# Patient Record
Sex: Female | Born: 1960 | Race: Black or African American | Hispanic: No | Smoking: Former smoker
Health system: Southern US, Community
[De-identification: ages and names within clinical notes are randomized; demographics above are authoritative.]

## PROBLEM LIST (undated history)

## (undated) DIAGNOSIS — F418 Other specified anxiety disorders: Secondary | ICD-10-CM

## (undated) DIAGNOSIS — I10 Essential (primary) hypertension: Secondary | ICD-10-CM

## (undated) DIAGNOSIS — E46 Unspecified protein-calorie malnutrition: Secondary | ICD-10-CM

## (undated) DIAGNOSIS — K219 Gastro-esophageal reflux disease without esophagitis: Secondary | ICD-10-CM

## (undated) DIAGNOSIS — N12 Tubulo-interstitial nephritis, not specified as acute or chronic: Secondary | ICD-10-CM

## (undated) DIAGNOSIS — F419 Anxiety disorder, unspecified: Secondary | ICD-10-CM

## (undated) DIAGNOSIS — I639 Cerebral infarction, unspecified: Secondary | ICD-10-CM

## (undated) DIAGNOSIS — F329 Major depressive disorder, single episode, unspecified: Secondary | ICD-10-CM

## (undated) DIAGNOSIS — I82409 Acute embolism and thrombosis of unspecified deep veins of unspecified lower extremity: Secondary | ICD-10-CM

## (undated) DIAGNOSIS — E7800 Pure hypercholesterolemia, unspecified: Secondary | ICD-10-CM

## (undated) DIAGNOSIS — G8929 Other chronic pain: Secondary | ICD-10-CM

## (undated) DIAGNOSIS — F32A Depression, unspecified: Secondary | ICD-10-CM

## (undated) HISTORY — PX: DILATION AND CURETTAGE OF UTERUS: SHX78

## (undated) HISTORY — PX: TRACHEOSTOMY: SUR1362

---
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
## 1992-01-19 HISTORY — PX: TUBAL LIGATION: SHX77

## 2007-04-19 DIAGNOSIS — I639 Cerebral infarction, unspecified: Secondary | ICD-10-CM

## 2007-04-19 HISTORY — DX: Cerebral infarction, unspecified: I63.9

## 2013-06-08 ENCOUNTER — Inpatient Hospital Stay (HOSPITAL_COMMUNITY)
Admission: EM | Admit: 2013-06-08 | Discharge: 2013-06-10 | DRG: 682 | Disposition: A | Discharge status: Home Health | Payer: Medicare Other | Attending: Internal Medicine | Admitting: Internal Medicine

## 2013-06-08 ENCOUNTER — Encounter (HOSPITAL_COMMUNITY): Payer: Self-pay | Admitting: Emergency Medicine

## 2013-06-08 ENCOUNTER — Emergency Department (HOSPITAL_COMMUNITY): Payer: Medicare Other

## 2013-06-08 ENCOUNTER — Inpatient Hospital Stay (HOSPITAL_COMMUNITY): Payer: Medicare Other

## 2013-06-08 DIAGNOSIS — N179 Acute kidney failure, unspecified: Principal | ICD-10-CM

## 2013-06-08 DIAGNOSIS — K5289 Other specified noninfective gastroenteritis and colitis: Secondary | ICD-10-CM | POA: Diagnosis present

## 2013-06-08 DIAGNOSIS — E785 Hyperlipidemia, unspecified: Secondary | ICD-10-CM | POA: Diagnosis present

## 2013-06-08 DIAGNOSIS — D72829 Elevated white blood cell count, unspecified: Secondary | ICD-10-CM | POA: Diagnosis present

## 2013-06-08 DIAGNOSIS — F329 Major depressive disorder, single episode, unspecified: Secondary | ICD-10-CM | POA: Diagnosis present

## 2013-06-08 DIAGNOSIS — I10 Essential (primary) hypertension: Secondary | ICD-10-CM

## 2013-06-08 DIAGNOSIS — I69959 Hemiplegia and hemiparesis following unspecified cerebrovascular disease affecting unspecified side: Secondary | ICD-10-CM

## 2013-06-08 DIAGNOSIS — Z66 Do not resuscitate: Secondary | ICD-10-CM | POA: Diagnosis present

## 2013-06-08 DIAGNOSIS — I69359 Hemiplegia and hemiparesis following cerebral infarction affecting unspecified side: Secondary | ICD-10-CM

## 2013-06-08 DIAGNOSIS — R4781 Slurred speech: Secondary | ICD-10-CM | POA: Diagnosis present

## 2013-06-08 DIAGNOSIS — G9341 Metabolic encephalopathy: Secondary | ICD-10-CM | POA: Diagnosis present

## 2013-06-08 DIAGNOSIS — R29810 Facial weakness: Secondary | ICD-10-CM | POA: Diagnosis present

## 2013-06-08 DIAGNOSIS — E860 Dehydration: Secondary | ICD-10-CM | POA: Diagnosis present

## 2013-06-08 DIAGNOSIS — G40909 Epilepsy, unspecified, not intractable, without status epilepticus: Secondary | ICD-10-CM | POA: Diagnosis present

## 2013-06-08 DIAGNOSIS — R4701 Aphasia: Secondary | ICD-10-CM | POA: Diagnosis present

## 2013-06-08 DIAGNOSIS — Z79899 Other long term (current) drug therapy: Secondary | ICD-10-CM

## 2013-06-08 DIAGNOSIS — F3289 Other specified depressive episodes: Secondary | ICD-10-CM | POA: Diagnosis present

## 2013-06-08 HISTORY — DX: Essential (primary) hypertension: I10

## 2013-06-08 HISTORY — DX: Cerebral infarction, unspecified: I63.9

## 2013-06-08 LAB — COMPREHENSIVE METABOLIC PANEL
ALT: 22 U/L (ref 0–35)
AST: 26 U/L (ref 0–37)
Albumin: 3.6 g/dL (ref 3.5–5.2)
Alkaline Phosphatase: 106 U/L (ref 39–117)
BUN: 37 mg/dL — ABNORMAL HIGH (ref 6–23)
CHLORIDE: 97 meq/L (ref 96–112)
CO2: 27 meq/L (ref 19–32)
CREATININE: 2.38 mg/dL — AB (ref 0.50–1.10)
Calcium: 9.6 mg/dL (ref 8.4–10.5)
GFR calc Af Amer: 26 mL/min — ABNORMAL LOW (ref >90)
GFR, EST NON AFRICAN AMERICAN: 23 mL/min — AB (ref >90)
GLUCOSE: 113 mg/dL — AB (ref 70–99)
Potassium: 3.8 mEq/L (ref 3.7–5.3)
Sodium: 140 mEq/L (ref 137–147)
Total Protein: 8.4 g/dL — ABNORMAL HIGH (ref 6.0–8.3)

## 2013-06-08 LAB — CBC
HEMATOCRIT: 41.6 % (ref 36.0–46.0)
HEMATOCRIT: 41.7 % (ref 36.0–46.0)
HEMOGLOBIN: 13.6 g/dL (ref 12.0–15.0)
HEMOGLOBIN: 13.7 g/dL (ref 12.0–15.0)
MCH: 30.7 pg (ref 26.0–34.0)
MCH: 31.6 pg (ref 26.0–34.0)
MCHC: 32.7 g/dL (ref 30.0–36.0)
MCHC: 32.9 g/dL (ref 30.0–36.0)
MCV: 93.9 fL (ref 78.0–100.0)
MCV: 96.3 fL (ref 78.0–100.0)
Platelets: 219 10*3/uL (ref 150–400)
Platelets: 177 10*3/uL (ref 150–400)
RBC: 4.43 MIL/uL (ref 3.87–5.11)
RBC: 4.33 MIL/uL (ref 3.87–5.11)
RDW: 13.3 % (ref 11.5–15.5)
RDW: 13.3 % (ref 11.5–15.5)
WBC: 6.9 10*3/uL (ref 4.0–10.5)
WBC: 7 10*3/uL (ref 4.0–10.5)

## 2013-06-08 LAB — I-STAT TROPONIN, ED: Troponin i, poc: 0.04 ng/mL (ref 0.00–0.08)

## 2013-06-08 LAB — GLUCOSE, CAPILLARY: Glucose-Capillary: 77 mg/dL (ref 70–99)

## 2013-06-08 LAB — SODIUM, URINE, RANDOM: Sodium, Ur: 84 mEq/L

## 2013-06-08 LAB — DIFFERENTIAL
BASOS ABS: 0 10*3/uL (ref 0.0–0.1)
Basophils Relative: 0 % (ref 0–1)
Eosinophils Absolute: 0.1 10*3/uL (ref 0.0–0.7)
Eosinophils Relative: 1 % (ref 0–5)
LYMPHS ABS: 2.1 10*3/uL (ref 0.7–4.0)
LYMPHS PCT: 31 % (ref 12–46)
MONO ABS: 0.7 10*3/uL (ref 0.1–1.0)
MONOS PCT: 9 % (ref 3–12)
Neutro Abs: 4 10*3/uL (ref 1.7–7.7)
Neutrophils Relative %: 59 % (ref 43–77)

## 2013-06-08 LAB — URINALYSIS, ROUTINE W REFLEX MICROSCOPIC
BILIRUBIN URINE: NEGATIVE
Glucose, UA: NEGATIVE mg/dL
Ketones, ur: NEGATIVE mg/dL
Leukocytes, UA: NEGATIVE
Nitrite: NEGATIVE
PH: 5.5 (ref 5.0–8.0)
Protein, ur: NEGATIVE mg/dL
SPECIFIC GRAVITY, URINE: 1.025 (ref 1.005–1.030)
Urobilinogen, UA: 0.2 mg/dL (ref 0.0–1.0)

## 2013-06-08 LAB — CREATININE, URINE, RANDOM: Creatinine, Urine: 217.05 mg/dL

## 2013-06-08 LAB — RAPID URINE DRUG SCREEN, HOSP PERFORMED
Amphetamines: NOT DETECTED
BENZODIAZEPINES: NOT DETECTED
Barbiturates: NOT DETECTED
COCAINE: NOT DETECTED
Opiates: NOT DETECTED
Tetrahydrocannabinol: NOT DETECTED

## 2013-06-08 LAB — I-STAT CHEM 8, ED
BUN: 36 mg/dL — ABNORMAL HIGH (ref 6–23)
CALCIUM ION: 1.14 mmol/L (ref 1.12–1.23)
CREATININE: 2.5 mg/dL — AB (ref 0.50–1.10)
Chloride: 101 mEq/L (ref 96–112)
Glucose, Bld: 116 mg/dL — ABNORMAL HIGH (ref 70–99)
HCT: 45 % (ref 36.0–46.0)
HEMOGLOBIN: 15.3 g/dL — AB (ref 12.0–15.0)
Potassium: 3.6 mEq/L — ABNORMAL LOW (ref 3.7–5.3)
SODIUM: 140 meq/L (ref 137–147)
TCO2: 29 mmol/L (ref 0–100)

## 2013-06-08 LAB — APTT: aPTT: 28 seconds (ref 24–37)

## 2013-06-08 LAB — PROTIME-INR
INR: 1.03 (ref 0.00–1.49)
Prothrombin Time: 13.3 seconds (ref 11.6–15.2)

## 2013-06-08 LAB — URINE MICROSCOPIC-ADD ON

## 2013-06-08 LAB — ETHANOL

## 2013-06-08 LAB — PHENYTOIN LEVEL, TOTAL: Phenytoin Lvl: 14.6 ug/mL (ref 10.0–20.0)

## 2013-06-08 MED ORDER — SODIUM CHLORIDE 0.9 % IV SOLN
INTRAVENOUS | Status: DC
  Administered 2013-06-08 – 2013-06-09 (×2): via INTRAVENOUS

## 2013-06-08 MED ORDER — ACETAMINOPHEN 650 MG RE SUPP: 650.0000 mg | RECTAL | Status: DC | PRN

## 2013-06-08 MED ORDER — ENOXAPARIN SODIUM 40 MG/0.4ML ~~LOC~~ SOLN
40.0000 mg | SUBCUTANEOUS | Status: DC
  Administered 2013-06-08 – 2013-06-09 (×2): 40 mg via SUBCUTANEOUS
  Filled 2013-06-08 (×3): qty 0.4

## 2013-06-08 MED ORDER — ASPIRIN 300 MG RE SUPP
300.0000 mg | Freq: Every day | RECTAL | Status: DC
  Filled 2013-06-08 (×2): qty 1

## 2013-06-08 MED ORDER — PHENYTOIN SODIUM EXTENDED 100 MG PO CAPS
100.0000 mg | ORAL_CAPSULE | Freq: Every day | ORAL | Status: DC
  Filled 2013-06-08: qty 1

## 2013-06-08 MED ORDER — SENNOSIDES-DOCUSATE SODIUM 8.6-50 MG PO TABS: 1.0000 | ORAL_TABLET | Freq: Every evening | ORAL | Status: DC | PRN

## 2013-06-08 MED ORDER — ACETAMINOPHEN 325 MG PO TABS: 650.0000 mg | ORAL_TABLET | ORAL | Status: DC | PRN

## 2013-06-08 MED ORDER — ASPIRIN 325 MG PO TABS
325.0000 mg | ORAL_TABLET | Freq: Every day | ORAL | Status: DC
  Administered 2013-06-09 – 2013-06-10 (×2): 325 mg via ORAL
  Filled 2013-06-08 (×2): qty 1

## 2013-06-08 MED ORDER — SIMVASTATIN 40 MG PO TABS
40.0000 mg | ORAL_TABLET | Freq: Every day | ORAL | Status: DC
  Administered 2013-06-09: 40 mg via ORAL
  Filled 2013-06-08 (×3): qty 1

## 2013-06-08 NOTE — ED Notes (Signed)
Patient transported to CT 

## 2013-06-08 NOTE — Progress Notes (Signed)
Patient admitted to unit. Oriented to room, call bell, and staff. Bed in lowest position. Fall safety plan reviewed. Full assessment to Epic. Will continue to monitor.

## 2013-06-08 NOTE — ED Provider Notes (Signed)
Medical screening examination/treatment/procedure(s) were conducted as a shared visit with non-physician practitioner(s) and myself.  I personally evaluated the patient during the encounter.   EKG Interpretation   Date/Time:  Friday Jun 08 2013 10:20:30 EDT Ventricular Rate:  81 PR Interval:  196 QRS Duration: 81 QT Interval:  420 QTC Calculation: 487 R Axis:   57 Text Interpretation:  Sinus rhythm Anterior infarct, old Nonspecific T  abnormalities, lateral leads NO prior for comparison Confirmed by Anona Giovannini   MD, Loma Sousa ([id number]) on 06/08/2013 11:39:15 AM      Patient presents with acute aphasia. Last seen normal before bed last night. Symptoms noted this morning upon awakening at approximately 8:30 or 9 AM. Patient has reported right-sided deficits at baseline.; However, patient noted by family members to have difficulty speaking and increasing weakness with transitioning. On exam, patient noted to have right facial droop and right upper and lower extremity weakness. Expressive aphasia.  Patient is not within TPA window given last seen normal last night. Concern for acute stroke. Neurology consulted. Patient will be admitted for full for workup including MRI.  Merryl Hacker, MD 06/08/13 1141

## 2013-06-08 NOTE — ED Provider Notes (Signed)
CSN: [id number]     Arrival date & time 06/08/13  1011 History   First MD Initiated Contact with Patient 06/08/13 1017     Chief Complaint  Patient presents with  . Aphasia     (Consider location/radiation/quality/duration/timing/severity/associated sxs/prior Treatment) HPI Comments: Patient presents to the emergency department with chief complaint of a seizure. She is brought in by her sister and her mother, who state that when they awoke the patient this morning, she had slurred speech. She has had a previous stroke in 2009, and has persistent right-sided upper and lower extremity weakness, as well as right-sided facial droop. The family members report the patient is normally able to speak very clearly, but when she awoke this morning, this was not the case. She was last seen normal last night before bed. There no aggravating or relieving factors.  The history is provided by a parent and a relative. No language interpreter was used.    Past Medical History  Diagnosis Date  . Stroke   . Hypertension    Past Surgical History  Procedure Laterality Date  . Tracheostomy     No family history on file. History  Substance Use Topics  . Smoking status: Never Smoker   . Smokeless tobacco: Not on file  . Alcohol Use: No   OB History   Grav Para Term Preterm Abortions TAB SAB Ect Mult Living                 Review of Systems  Constitutional: Negative for fever and chills.  Respiratory: Negative for shortness of breath.   Cardiovascular: Negative for chest pain.  Gastrointestinal: Negative for nausea, vomiting, diarrhea and constipation.  Genitourinary: Negative for dysuria.  Neurological: Positive for weakness.       Aphasia      Allergies  Review of patient's allergies indicates no known allergies.  Home Medications   Prior to Admission medications   Not on File   LMP 04/29/2013 Physical Exam  Nursing note and vitals reviewed. Constitutional: She is oriented to  person, place, and time. She appears well-developed and well-nourished.  HENT:  Head: Normocephalic and atraumatic.  Persistent right-sided facial droop  Eyes: Conjunctivae and EOM are normal. Pupils are equal, round, and reactive to light.  Neck: Normal range of motion. Neck supple.  Cardiovascular: Normal rate and regular rhythm.  Exam reveals no gallop and no friction rub.   No murmur heard. Pulmonary/Chest: Effort normal and breath sounds normal. No respiratory distress. She has no wheezes. She has no rales. She exhibits no tenderness.  Abdominal: Soft. Bowel sounds are normal. She exhibits no distension and no mass. There is no tenderness. There is no rebound and no guarding.  Musculoskeletal: Normal range of motion. She exhibits no edema and no tenderness.  Profound weakness in right upper and lower extremity, left upper and lower extremity range of motion and strength 5/5  Neurological: She is alert and oriented to person, place, and time.  Follows commands, no new cranial nerve deficits, right-sided facial droop from prior stroke  Skin: Skin is warm and dry.  Psychiatric: She has a normal mood and affect. Her behavior is normal. Judgment and thought content normal.    ED Course  Procedures (including critical care time) Results for orders placed during the hospital encounter of 06/08/13  ETHANOL      Result Value Ref Range   Alcohol, Ethyl (B) <11  0 - 11 mg/dL  PROTIME-INR      Result Value  Ref Range   Prothrombin Time 13.3  11.6 - 15.2 seconds   INR 1.03  0.00 - 1.49  APTT      Result Value Ref Range   aPTT 28  24 - 37 seconds  CBC      Result Value Ref Range   WBC 6.9  4.0 - 10.5 K/uL   RBC 4.43  3.87 - 5.11 MIL/uL   Hemoglobin 13.6  12.0 - 15.0 g/dL   HCT 41.6  36.0 - 46.0 %   MCV 93.9  78.0 - 100.0 fL   MCH 30.7  26.0 - 34.0 pg   MCHC 32.7  30.0 - 36.0 g/dL   RDW 13.3  11.5 - 15.5 %   Platelets 219  150 - 400 K/uL  DIFFERENTIAL      Result Value Ref Range    Neutrophils Relative % 59  43 - 77 %   Neutro Abs 4.0  1.7 - 7.7 K/uL   Lymphocytes Relative 31  12 - 46 %   Lymphs Abs 2.1  0.7 - 4.0 K/uL   Monocytes Relative 9  3 - 12 %   Monocytes Absolute 0.7  0.1 - 1.0 K/uL   Eosinophils Relative 1  0 - 5 %   Eosinophils Absolute 0.1  0.0 - 0.7 K/uL   Basophils Relative 0  0 - 1 %   Basophils Absolute 0.0  0.0 - 0.1 K/uL  COMPREHENSIVE METABOLIC PANEL      Result Value Ref Range   Sodium 140  137 - 147 mEq/L   Potassium 3.8  3.7 - 5.3 mEq/L   Chloride 97  96 - 112 mEq/L   CO2 27  19 - 32 mEq/L   Glucose, Bld 113 (*) 70 - 99 mg/dL   BUN 37 (*) 6 - 23 mg/dL   Creatinine, Ser 2.38 (*) 0.50 - 1.10 mg/dL   Calcium 9.6  8.4 - 10.5 mg/dL   Total Protein 8.4 (*) 6.0 - 8.3 g/dL   Albumin 3.6  3.5 - 5.2 g/dL   AST 26  0 - 37 U/L   ALT 22  0 - 35 U/L   Alkaline Phosphatase 106  39 - 117 U/L   Total Bilirubin <0.2 (*) 0.3 - 1.2 mg/dL   GFR calc non Af Amer 23 (*) >90 mL/min   GFR calc Af Amer 26 (*) >90 mL/min  I-STAT CHEM 8, ED      Result Value Ref Range   Sodium 140  137 - 147 mEq/L   Potassium 3.6 (*) 3.7 - 5.3 mEq/L   Chloride 101  96 - 112 mEq/L   BUN 36 (*) 6 - 23 mg/dL   Creatinine, Ser 2.50 (*) 0.50 - 1.10 mg/dL   Glucose, Bld 116 (*) 70 - 99 mg/dL   Calcium, Ion 1.14  1.12 - 1.23 mmol/L   TCO2 29  0 - 100 mmol/L   Hemoglobin 15.3 (*) 12.0 - 15.0 g/dL   HCT 45.0  36.0 - 46.0 %  I-STAT TROPOININ, ED      Result Value Ref Range   Troponin i, poc 0.04  0.00 - 0.08 ng/mL   Comment 3            Ct Head Wo Contrast  06/08/2013   CLINICAL DATA:  Code stroke, speech disturbance  EXAM: CT HEAD WITHOUT CONTRAST  TECHNIQUE: Contiguous axial images were obtained from the base of the skull through the vertex without intravenous contrast.  COMPARISON:  None.  FINDINGS: There is ex vacuo dilation of the left lateral ventricle. There is adjacent decreased density in the deep white matter of the of the left cerebral hemisphere. There are milder  changes on the right. Mildly increased density in the region of the left thalamus consistent with previous ischemic insult. No acute intracranial hemorrhage. The cerebellum and brainstem are normal in density.  The observed paranasal sinuses and mastoid air cells are clear. There is no acute skull fracture.  IMPRESSION: 1. No acute intracranial hemorrhage nor evidence of an acute ischemic infarction. 2. Chronic changes of small-vessel ischemia diffusely with focal changes of previous left thalamic CVA. 3. These results were called by telephone at the time of interpretation on 06/08/2013 at 11:05 AM to Dr. Montine Circle , who verbally acknowledged these results.   Electronically Signed   By: David  Martinique   On: 06/08/2013 11:04     Imaging Review No results found.   EKG Interpretation   Date/Time:  Friday Jun 08 2013 10:20:30 EDT Ventricular Rate:  81 PR Interval:  196 QRS Duration: 81 QT Interval:  420 QTC Calculation: 487 R Axis:   57 Text Interpretation:  Sinus rhythm Anterior infarct, old Nonspecific T  abnormalities, lateral leads NO prior for comparison Confirmed by HORTON   MD, Loma Sousa ([id number]) on 06/08/2013 11:39:15 AM      MDM   Final diagnoses:  Aphasia    10:37 AM Patient immediately seen by and discussed with Dr. Dina Rich.  Will not activate code stroke, based on patient being last seen normal last night.  No TPA based on timeline.  Will consult neurology.  Stroke workup is pending.  CT to rule out bleed.  Doubt seizure.  10:51 AM Discussed the patient with Dr. Aram Beecham, who recommends admission to hospitalist at [facility].  Discussed the plan with Dr. Dina Rich, who agrees with the plan.        Montine Circle, PA-C 06/08/13 1401

## 2013-06-08 NOTE — ED Notes (Signed)
Patient is unable to urinate at this time 

## 2013-06-08 NOTE — ED Notes (Signed)
Hospitalist requests that stroke swallow screen be redone. Pt has hx of dysphasia and has no control over L side of mouth. Stroke swallow consult with speech therapy ordered via verbal order.

## 2013-06-08 NOTE — ED Notes (Signed)
Pt family again updated that we are still waiting for a room for pt. Pt family offered drinks and snacks.

## 2013-06-08 NOTE — H&P (Signed)
PCP:  No PCP on file  Chief Complaint:  Slurred speech  HPI: 53 year old female with history of CVA with resulting right-sided hemiparesis, hypertension (uncontrolled), ? Seizure disorder, currently patient on Dilantin to prevent seizures as per family, but never had seizures, was brought to the ED with slurred speech, worsening weakness with transitioning. Patient has limited mobility at baseline due to old CVA. There was no seizure, denies blurred vision. As per family patient has been sick over the past 2 days, had nausea and vomiting 2 days ago. Also she was nauseous yesterday had very poor by mouth intake. But patient was taking her medications as prescribed. Yesterday patient saw her PCP, and did not have significant symptoms at that time. She denies chest pain, no shortness of breath, no dysuria urgency frequency of urination, no fever. In the ED CT head was done which did not show stroke, patient not within TPA window as she was seen last normal last night. ED physician called and neurology at [facility], and a candidate for further workup including MRI.  Allergies:  No Known Allergies    Past Medical History  Diagnosis Date  . Stroke   . Hypertension     Past Surgical History  Procedure Laterality Date  . Tracheostomy      Prior to Admission medications   Medication Sig Start Date End Date Taking? Authorizing Provider  amitriptyline (ELAVIL) 25 MG tablet Take 25 mg by mouth at bedtime.   Yes Historical Provider, MD  cloNIDine (CATAPRES) 0.2 MG tablet Take 0.2 mg by mouth 4 (four) times daily.   Yes Historical Provider, MD  DULoxetine (CYMBALTA) 20 MG capsule Take 20 mg by mouth daily.   Yes Historical Provider, MD  gabapentin (NEURONTIN) 400 MG capsule Take 400 mg by mouth 3 (three) times daily.   Yes Historical Provider, MD  hydrALAZINE (APRESOLINE) 50 MG tablet Take 50 mg by mouth 4 (four) times daily.   Yes Historical Provider, MD  metoprolol (LOPRESSOR) 100 MG tablet Take  100 mg by mouth 2 (two) times daily.   Yes Historical Provider, MD  phenytoin (DILANTIN) 100 MG ER capsule Take by mouth daily.   Yes Historical Provider, MD  simvastatin (ZOCOR) 40 MG tablet Take 40 mg by mouth daily.   Yes Historical Provider, MD  spironolactone (ALDACTONE) 25 MG tablet Take 25 mg by mouth 3 (three) times daily.   Yes Historical Provider, MD  telmisartan-hydrochlorothiazide (MICARDIS HCT) 80-12.5 MG per tablet Take 1 tablet by mouth daily.   Yes Historical Provider, MD    Social History:  reports that she has never smoked. She does not have any smokeless tobacco history on file. She reports that she does not drink alcohol. Her drug history is not on file.    All the positives are listed in BOLD  Review of Systems:  HEENT: Headache, blurred vision, runny nose, sore throat Neck: Hypothyroidism, hyperthyroidism,,lymphadenopathy Chest : Shortness of breath, history of COPD, Asthma Heart : Chest pain, history of coronary arterey disease GI:  Nausea, vomiting, diarrhea, constipation, GERD GU: Dysuria, urgency, frequency of urination, hematuria Neuro: Stroke, seizures, syncope Psych: Depression, anxiety, hallucinations   Physical Exam: Blood pressure 101/63, pulse 73, resp. rate 14, last menstrual period 04/29/2013, SpO2 94.00%. Constitutional:   Patient is a well-developed and well-nourished *female in no acute distress and cooperative with exam. Head: Normocephalic and atraumatic Mouth: Mucus membranes moist Eyes: PERRL, EOMI, conjunctivae normal Neck: Supple, No Thyromegaly Cardiovascular: RRR, S1 normal, S2 normal Pulmonary/Chest: CTAB, no  wheezes, rales, or rhonchi Abdominal: Soft. Non-tender, non-distended, bowel sounds are normal, no masses, organomegaly, or guarding present.  Neurological: A&O x3, Strenght is normal  in left upper and lower extremity , 1/5 in right upper and lower extremities and  cranial nerve II-XII are grossly intact, no sensory deficit noted  at this time.  Extremities : No Cyanosis, Clubbing or Edema  Labs on Admission:  Basic Metabolic Panel:  Recent Labs Lab 06/08/13 1028 06/08/13 1035  NA 140 140  K 3.8 3.6*  CL 97 101  CO2 27  --   GLUCOSE 113* 116*  BUN 37* 36*  CREATININE 2.38* 2.50*  CALCIUM 9.6  --    Liver Function Tests:  Recent Labs Lab 06/08/13 1028  AST 26  ALT 22  ALKPHOS 106  BILITOT <0.2*  PROT 8.4*  ALBUMIN 3.6   No results found for this basename: LIPASE, AMYLASE,  in the last 168 hours No results found for this basename: AMMONIA,  in the last 168 hours CBC:  Recent Labs Lab 06/08/13 1028 06/08/13 1035  WBC 6.9  --   NEUTROABS 4.0  --   HGB 13.6 15.3*  HCT 41.6 45.0  MCV 93.9  --   PLT 219  --    Cardiac Enzymes: No results found for this basename: CKTOTAL, CKMB, CKMBINDEX, TROPONINI,  in the last 168 hours  BNP (last 3 results) No results found for this basename: PROBNP,  in the last 8760 hours CBG: No results found for this basename: GLUCAP,  in the last 168 hours  Radiological Exams on Admission: Ct Head Wo Contrast  06/08/2013   CLINICAL DATA:  Code stroke, speech disturbance  EXAM: CT HEAD WITHOUT CONTRAST  TECHNIQUE: Contiguous axial images were obtained from the base of the skull through the vertex without intravenous contrast.  COMPARISON:  None.  FINDINGS: There is ex vacuo dilation of the left lateral ventricle. There is adjacent decreased density in the deep white matter of the of the left cerebral hemisphere. There are milder changes on the right. Mildly increased density in the region of the left thalamus consistent with previous ischemic insult. No acute intracranial hemorrhage. The cerebellum and brainstem are normal in density.  The observed paranasal sinuses and mastoid air cells are clear. There is no acute skull fracture.  IMPRESSION: 1. No acute intracranial hemorrhage nor evidence of an acute ischemic infarction. 2. Chronic changes of small-vessel ischemia  diffusely with focal changes of previous left thalamic CVA. 3. These results were called by telephone at the time of interpretation on 06/08/2013 at 11:05 AM to Dr. Montine Circle , who verbally acknowledged these results.   Electronically Signed   By: David  Martinique   On: 06/08/2013 11:04    EKG: Independently reviewed. t wave inversion in leads V5, V6   Assessment/Plan Principal Problem:   Aphasia Active Problems:   AKI (acute kidney injury)   CVA, old, hemiparesis   HTN (hypertension)   Leukocytosis  Aphasia Patient has expressive aphasia, concerning for stroke. We'll admit the patient in telemetry and transferred to [facility] to be evaluated by neurology. Patient will need a MRI/MRA of the brain, carotid Dopplers, echo. Will also check lipid profile and hemoglobin A1c. PT OT consultation, swallow evaluation. Neurochecks every 2 hours  Acute kidney injury Patient has acute kidney injury with creatinine 2.50. Baseline labs not available at this time, as per family patient has never been diagnosed with C. KD. Likely acute kidney injury due to dehydration from  poor by mouth intake as well as diuretics HCTZ patient has been taking for hypertension. We'll start the patient on IV normal saline, hold the diuretics at this time and check renal ultrasound, urine creatinine, urine sodium and follow BMP in a.m.  Hypertension As per family members patient has history of uncontrolled hypertension and is on multiple medications. At this time patient's blood pressure is soft with systolic blood pressure in the 100s. Will hold the antihypertensive medications at this time till the stroke is ruled out. Consider starting him medications when necessary if systolic blood pressure greater than 200. EKG shows T wave abnormalities in lead V5,V6. First troponin is negative in the ED. Will check troponin q 6 hr x 3.  Leukocytosis ? Cause, patient has been afebrile. Will check UA and chest x-ray. Follow the  results  Polypharmacy Patient has been on multiple medications including amitriptyline, Cymbalta, Neurontin,  which all could affect the mental status. And with worsening renal function, could have contributed to patient's aphasia/slurred speech. I'm going to hold all the medication that this time. Will be restarted once patient's mental status improves.  Hyperlipidemia Continue Zocor  ? Seizure disorder Patient never had seizure as per family. Dilantin was started as a preventive measure for seizures in 2009. Will check Dilantin level and continue Dilantin 100 mg by mouth daily at this time.     DVT prophylaxis Lovenox   Code status: DO NOT RESUSCITATE   Family discussion: discussed in detail with patient's mother and sister at bedside. Patient is a DO NOT RESUSCITATE .   Time Spent on Admission: 70 minutes  [facility] Pager: [phone] 06/08/2013, 12:29 PM  If 7PM-7AM, please contact night-coverage  [email or web address]  Password [email or web address]

## 2013-06-08 NOTE — Consult Note (Signed)
Referring Physician: [place]    Chief Complaint: Increased right sided weakness  HPI: Ann Lynch is an 53 y.o. female with a history of a stroke in 2009 with resultant right sided weakness and seizures who has been sick for the past 2 days.  She has had nausea and vomiting.  She has been able to eat and drink very little but reports that she has been able to take her medications.  This morning she reports feeling very poorly.  There was some concern that she may have been weaker on her right than at baseline and that there was some slurred speech.  The patient reports that at baseline she has no movement on the right.  Date last known well: Unable to determine Time last known well: Unable to determine tPA Given: No: Unable to determine LKW  Past Medical History  Diagnosis Date  . Stroke   . Hypertension     Past Surgical History  Procedure Laterality Date  . Tracheostomy      Family history: Mother with gout and diabetes.  Farther deceased but unclear about his medical history .    Social History:  reports that she has never smoked. She does not have any smokeless tobacco history on file. She reports that she does not drink alcohol. Her drug history is not on file.  Allergies: No Known Allergies  Medications:  I have reviewed the patient's current medications. Prior to Admission:  Prescriptions prior to admission  Medication Sig Dispense Refill  . amitriptyline (ELAVIL) 25 MG tablet Take 25 mg by mouth at bedtime.      . cloNIDine (CATAPRES) 0.2 MG tablet Take 0.2 mg by mouth 4 (four) times daily.      . DULoxetine (CYMBALTA) 20 MG capsule Take 20 mg by mouth daily.      Marland Kitchen gabapentin (NEURONTIN) 400 MG capsule Take 400 mg by mouth 3 (three) times daily.      . hydrALAZINE (APRESOLINE) 50 MG tablet Take 50 mg by mouth 4 (four) times daily.      . metoprolol (LOPRESSOR) 100 MG tablet Take 100 mg by mouth 2 (two) times daily.      . phenytoin (DILANTIN) 100 MG ER capsule Take  by mouth daily.      . simvastatin (ZOCOR) 40 MG tablet Take 40 mg by mouth daily.      Marland Kitchen spironolactone (ALDACTONE) 25 MG tablet Take 25 mg by mouth 3 (three) times daily.      Marland Kitchen telmisartan-hydrochlorothiazide (MICARDIS HCT) 80-12.5 MG per tablet Take 1 tablet by mouth daily.       Scheduled: . aspirin  300 mg Rectal Daily   Or  . aspirin  325 mg Oral Daily  . enoxaparin (LOVENOX) injection  40 mg Subcutaneous Q24H  . [START ON 06/09/2013] phenytoin  100 mg Oral Daily  . simvastatin  40 mg Oral Daily    ROS: History obtained from the patient  General ROS: negative for - chills, fatigue, fever, night sweats, weight gain or weight loss Psychological ROS: negative for - behavioral disorder, hallucinations, memory difficulties, mood swings or suicidal ideation Ophthalmic ROS: negative for - blurry vision, double vision, eye pain or loss of vision ENT ROS: negative for - epistaxis, nasal discharge, oral lesions, sore throat, tinnitus or vertigo Allergy and Immunology ROS: negative for - hives or itchy/watery eyes Hematological and Lymphatic ROS: negative for - bleeding problems, bruising or swollen lymph nodes Endocrine ROS: negative for - galactorrhea, hair pattern changes, polydipsia/polyuria  or temperature intolerance Respiratory ROS: negative for - cough, hemoptysis, shortness of breath or wheezing Cardiovascular ROS: negative for - chest pain, dyspnea on exertion, edema or irregular heartbeat Gastrointestinal ROS: as noted in HPI Genito-Urinary ROS: negative for - dysuria, hematuria, incontinence or urinary frequency/urgency Musculoskeletal ROS: negative for - joint swelling or muscular weakness Neurological ROS: as noted in HPI Dermatological ROS: negative for rash and skin lesion changes  Physical Examination: Blood pressure 144/94, pulse 82, temperature 98 F (36.7 C), temperature source Oral, resp. rate 16, last menstrual period 04/29/2013, SpO2 99.00%.  Neurologic  Examination: Mental Status: Alert, oriented, thought content appropriate.  Speech fluent without evidence of aphasia with some slurring noted.  Able to follow 3 step commands without difficulty. Cranial Nerves: II: Discs flat bilaterally; RHH, pupils equal, round, reactive to light and accommodation III,IV, VI: ptosis not present, extra-ocular motions intact bilaterally V,VII: right facial droop, facial light touch sensation decreased on the right VIII: hearing normal bilaterally IX,X: gag reflex present XI: shoulder shrug decreased on the right XII: midline tongue extension Motor: Right : Upper extremity   0/5    Left:     Upper extremity   5/5  Lower extremity   0/5     Lower extremity   5/5 Tone and bulk:increased tone on the right Sensory: Pinprick and light touch decreased on the right Deep Tendon Reflexes: 3+ and symmetric throughout Plantars: Right: upgoing   Left: upgoing Cerebellar: normal finger-to-nose in the LUE Gait: patient non-ambulatory CV: pulses palpable throughout     Laboratory Studies:  Basic Metabolic Panel:  Recent Labs Lab 06/08/13 1028 06/08/13 1035  NA 140 140  K 3.8 3.6*  CL 97 101  CO2 27  --   GLUCOSE 113* 116*  BUN 37* 36*  CREATININE 2.38* 2.50*  CALCIUM 9.6  --     Liver Function Tests:  Recent Labs Lab 06/08/13 1028  AST 26  ALT 22  ALKPHOS 106  BILITOT <0.2*  PROT 8.4*  ALBUMIN 3.6   No results found for this basename: LIPASE, AMYLASE,  in the last 168 hours No results found for this basename: AMMONIA,  in the last 168 hours  CBC:  Recent Labs Lab 06/08/13 1028 06/08/13 1035  WBC 6.9  --   NEUTROABS 4.0  --   HGB 13.6 15.3*  HCT 41.6 45.0  MCV 93.9  --   PLT 219  --     Cardiac Enzymes: No results found for this basename: CKTOTAL, CKMB, CKMBINDEX, TROPONINI,  in the last 168 hours  BNP: No components found with this basename: POCBNP,   CBG:  Recent Labs Lab 06/08/13 2046  GLUCAP 77     Microbiology: No results found for this or any previous visit.  Coagulation Studies:  Recent Labs  06/08/13 1028  LABPROT 13.3  INR 1.03    Urinalysis: No results found for this basename: COLORURINE, APPERANCEUR, LABSPEC, PHURINE, GLUCOSEU, HGBUR, BILIRUBINUR, KETONESUR, PROTEINUR, UROBILINOGEN, NITRITE, LEUKOCYTESUR,  in the last 168 hours  Lipid Panel: No results found for this basename: chol, trig, hdl, cholhdl, vldl, ldlcalc    HgbA1C:  No results found for this basename: HGBA1C    Urine Drug Screen:   No results found for this basename: labopia, cocainscrnur, labbenz, amphetmu, thcu, labbarb    Alcohol Level:  Recent Labs Lab 06/08/13 1028  ETH <11    Other results: EKG: sinus rhythm at 81 bpm.  Imaging: Ct Head Wo Contrast  06/08/2013   CLINICAL DATA:  Code stroke, speech disturbance  EXAM: CT HEAD WITHOUT CONTRAST  TECHNIQUE: Contiguous axial images were obtained from the base of the skull through the vertex without intravenous contrast.  COMPARISON:  None.  FINDINGS: There is ex vacuo dilation of the left lateral ventricle. There is adjacent decreased density in the deep white matter of the of the left cerebral hemisphere. There are milder changes on the right. Mildly increased density in the region of the left thalamus consistent with previous ischemic insult. No acute intracranial hemorrhage. The cerebellum and brainstem are normal in density.  The observed paranasal sinuses and mastoid air cells are clear. There is no acute skull fracture.  IMPRESSION: 1. No acute intracranial hemorrhage nor evidence of an acute ischemic infarction. 2. Chronic changes of small-vessel ischemia diffusely with focal changes of previous left thalamic CVA. 3. These results were called by telephone at the time of interpretation on 06/08/2013 at 11:05 AM to Dr. Montine Circle , who verbally acknowledged these results.   Electronically Signed   By: David  Martinique   On: 06/08/2013 11:04    US Renal  06/08/2013   CLINICAL DATA:  Elevated creatinine.  EXAM: RENAL/URINARY TRACT ULTRASOUND COMPLETE  COMPARISON:  None.  FINDINGS: Right Kidney:  Length: 10.0 cm. Diffusely increased echogenicity. No mass or hydronephrosis visualized.  Left Kidney:  Length: 10.8 cm. Diffusely increased echogenicity. No mass or hydronephrosis visualized.  Bladder:  Appears normal for degree of bladder distention. The bladder volume at the time imaging is 71 cc.  IMPRESSION: Increased echogenicity of both kidneys suggests chronic medical renal disease. The kidneys are within normal limits for size.   Electronically Signed   By: Curlene Dolphin M.D.   On: 06/08/2013 21:52    Assessment: 53 y.o. female presenting with complaints of slurred speech and increased right sided weakness that on further conversation may just be her feeling poorly overall due to her recent GI illness.  Head CT reviewed and shows no acute changes.  Patient on an aspirin a day.  No evidence of seizure activity.  She remains on Dilantin.  Dilantin level 14.6.    Stroke Risk Factors - hypertension  Plan: 1. MRI of the brain without contrast.  Would not initiate stroke work up at this time unless MRI indicative of an acute ischemic event.   2. Continue Dilantin at current dose 3. Seizure precautions 4. Agree with addressing medical issues   Alexis Goodell, MD [facility] [phone] 06/08/2013, 10:54 PM

## 2013-06-08 NOTE — ED Notes (Signed)
Pt family made aware that there are no beds at the [facility] and we are waiting for d/c so that a room may be assigned. Pt and family voiced understanding. Pt family provided with graham crackers and coffee and warm blankets.

## 2013-06-08 NOTE — ED Notes (Signed)
Pt woke up around 0800 to take pills, pt was groggy but took pills with drink, pt was woke up again around 0900 with slurred speech. Pt normally able to talk w/ complete sentence. Pt does have hx of stroke. Pt states pt is normally able to get self up, in and out of bed but pt was not able to do so this morning.

## 2013-06-08 NOTE — Progress Notes (Signed)
  CARE MANAGEMENT ED NOTE 06/08/2013  Patient:  Ann Lynch,Ann Lynch   Account Number:  [id number]  Date Initiated:  06/08/2013  Documentation initiated by:  Livia Snellen  Subjective/Objective Assessment:   Patient presents to Ed with slurred speech and worsening weakness     Subjective/Objective Assessment Detail:   Patient with pmhx of CVa and HTN     Action/Plan:   Action/Plan Detail:   Anticipated DC Date:       Status Recommendation to Physician:   Result of Recommendation:    Other ED [place]  Other  PCP issues    Choice offered to / List presented to:            Status of service:  Completed, signed off  ED Comments:   ED Comments Detail:  EDCM spoke to patient's family members at bedside.  As per patient's family members, "she has a doctor but we are not going to use him anymore."  [facility] provided patient's family with  a list of pcps who accept Medicare insurance within a ten mile radius of patient's zip code.  Patient's family thnakful for resources.  No further EDCM needs at this time.

## 2013-06-09 ENCOUNTER — Inpatient Hospital Stay (HOSPITAL_COMMUNITY): Payer: Medicare Other

## 2013-06-09 LAB — CBC
HEMATOCRIT: 40.7 % (ref 36.0–46.0)
Hemoglobin: 13.4 g/dL (ref 12.0–15.0)
MCH: 31.5 pg (ref 26.0–34.0)
MCHC: 32.9 g/dL (ref 30.0–36.0)
MCV: 95.8 fL (ref 78.0–100.0)
Platelets: 197 10*3/uL (ref 150–400)
RBC: 4.25 MIL/uL (ref 3.87–5.11)
RDW: 13.4 % (ref 11.5–15.5)
WBC: 6.1 10*3/uL (ref 4.0–10.5)

## 2013-06-09 LAB — GLUCOSE, CAPILLARY
GLUCOSE-CAPILLARY: 89 mg/dL (ref 70–99)
Glucose-Capillary: 82 mg/dL (ref 70–99)
Glucose-Capillary: 77 mg/dL (ref 70–99)
Glucose-Capillary: 84 mg/dL (ref 70–99)

## 2013-06-09 LAB — COMPREHENSIVE METABOLIC PANEL
ALBUMIN: 3.5 g/dL (ref 3.5–5.2)
ALK PHOS: 104 U/L (ref 39–117)
ALT: 17 U/L (ref 0–35)
AST: 21 U/L (ref 0–37)
BILIRUBIN TOTAL: 0.3 mg/dL (ref 0.3–1.2)
BUN: 29 mg/dL — ABNORMAL HIGH (ref 6–23)
CO2: 26 mEq/L (ref 19–32)
CREATININE: 1.15 mg/dL — AB (ref 0.50–1.10)
Calcium: 9.1 mg/dL (ref 8.4–10.5)
Chloride: 101 mEq/L (ref 96–112)
GFR calc Af Amer: 63 mL/min — ABNORMAL LOW (ref >90)
GFR calc non Af Amer: 55 mL/min — ABNORMAL LOW (ref >90)
GLUCOSE: 75 mg/dL (ref 70–99)
POTASSIUM: 3.5 meq/L — AB (ref 3.7–5.3)
Sodium: 142 mEq/L (ref 137–147)
Total Protein: 8 g/dL (ref 6.0–8.3)

## 2013-06-09 LAB — LIPID PANEL
Cholesterol: 210 mg/dL — ABNORMAL HIGH (ref 0–200)
HDL: 58 mg/dL (ref >39)
LDL CALC: 131 mg/dL — AB (ref 0–99)
Total CHOL/HDL Ratio: 3.6 RATIO
Triglycerides: 104 mg/dL (ref <150)
VLDL: 21 mg/dL (ref 0–40)

## 2013-06-09 LAB — HEMOGLOBIN A1C
HEMOGLOBIN A1C: 5.3 % (ref <5.7)
MEAN PLASMA GLUCOSE: 105 mg/dL (ref <117)

## 2013-06-09 MED ORDER — PHENYTOIN SODIUM 50 MG/ML IJ SOLN
100.0000 mg | Freq: Every day | INTRAMUSCULAR | Status: DC
  Administered 2013-06-09: 100 mg via INTRAVENOUS
  Filled 2013-06-09 (×2): qty 2

## 2013-06-09 MED ORDER — POTASSIUM CHLORIDE IN NACL 40-0.9 MEQ/L-% IV SOLN
INTRAVENOUS | Status: DC
Start: 2013-06-09 — End: 2013-06-10
  Administered 2013-06-09: 11:00:00 via INTRAVENOUS
  Filled 2013-06-09 (×2): qty 1000

## 2013-06-09 MED ORDER — NIACIN 100 MG PO TABS
100.0000 mg | ORAL_TABLET | Freq: Every day | ORAL | Status: DC
  Administered 2013-06-09: 100 mg via ORAL
  Filled 2013-06-09 (×2): qty 1

## 2013-06-09 MED ORDER — POTASSIUM CHLORIDE CRYS ER 20 MEQ PO TBCR: 40.0000 meq | EXTENDED_RELEASE_TABLET | ORAL | Status: DC

## 2013-06-09 MED ORDER — HYDRALAZINE HCL 20 MG/ML IJ SOLN
10.0000 mg | Freq: Four times a day (QID) | INTRAMUSCULAR | Status: DC | PRN
  Administered 2013-06-10: 10 mg via INTRAVENOUS
  Filled 2013-06-09 (×2): qty 1

## 2013-06-09 MED ORDER — CLONIDINE HCL 0.2 MG PO TABS
0.2000 mg | ORAL_TABLET | Freq: Four times a day (QID) | ORAL | Status: DC
  Administered 2013-06-09 (×3): 0.2 mg via ORAL
  Filled 2013-06-09 (×7): qty 1

## 2013-06-09 MED ORDER — METOPROLOL TARTRATE 100 MG PO TABS
100.0000 mg | ORAL_TABLET | Freq: Two times a day (BID) | ORAL | Status: DC
  Administered 2013-06-09 (×2): 100 mg via ORAL
  Filled 2013-06-09 (×4): qty 1

## 2013-06-09 MED ORDER — HYDRALAZINE HCL 20 MG/ML IJ SOLN
10.0000 mg | Freq: Once | INTRAMUSCULAR | Status: AC
  Administered 2013-06-09: 10 mg via INTRAVENOUS

## 2013-06-09 NOTE — Evaluation (Signed)
Speech Language Pathology Evaluation Patient Details Name: Ann Lynch MRN: [id number] DOB: 1960/04/15 Today's Date: 06/09/2013 Time: 3007-6226 SLP Time Calculation (min): 15 min  Problem List:  Patient Active Problem List   Diagnosis Date Noted  . Aphasia 06/08/2013  . Slurred speech 06/08/2013  . AKI (acute kidney injury) 06/08/2013  . CVA, old, hemiparesis 06/08/2013  . HTN (hypertension) 06/08/2013   Past Medical History:  Past Medical History  Diagnosis Date  . Stroke   . Hypertension    Past Surgical History:  Past Surgical History  Procedure Laterality Date  . Tracheostomy     HPI:  53 year-old female with history of CVA with resulting right-sided hemiparesis, hypertension admitted to  ED with slurred speech, worsening weakness.  CT:  1. No acute intracranial hemorrhage nor evidence of an acute ischemic infarction.     Assessment / Plan / Recommendation Clinical Impression  Cognitive Linguistic Evaluation completed per Stroke Protocol.  Cognitive deficits evident in areas of problem solving, safety awareness, and  sustained attention.  Deficits present but judged to be baseline.  No further Skilled ST Treatment indicated in area of cognition as receiving necessary supervision and assist in current setting and at home.    ST to address dysphagia management in acute care setting.      SLP Assessment  Patient does not need any further Speech Lanaguage Pathology Services    Follow Up Recommendations   (TBD)    Frequency and Duration min 2x/week       SLP Evaluation Prior Functioning  Cognitive/Linguistic Baseline: Information not available Type of Home: House  Lives With: Family Available Help at Discharge: Family;Available 24 hours/day   Cognition  Overall Cognitive Status: History of cognitive impairments - at baseline Arousal/Alertness: Awake/alert Orientation Level: Oriented X4 Attention: Sustained Sustained Attention: Impaired Sustained  Attention Impairment: Verbal complex;Functional basic Awareness: Impaired Awareness Impairment: Emergent impairment;Anticipatory impairment Problem Solving: Impaired Problem Solving Impairment: Verbal complex;Functional basic;Functional complex Behaviors: Impulsive Safety/Judgment: Impaired    Comprehension  Auditory Comprehension Overall Auditory Comprehension: Appears within functional limits for tasks assessed Visual Recognition/Discrimination Discrimination: Not tested Reading Comprehension Reading Status: Not tested    Expression Expression Primary Mode of Expression: Verbal Verbal Expression Overall Verbal Expression: Appears within functional limits for tasks assessed   Oral / Motor Oral Motor/Sensory Function Overall Oral Motor/Sensory Function: Impaired at baseline Labial ROM: Reduced right Labial Symmetry: Abnormal symmetry right Labial Strength: Reduced Labial Sensation: Reduced Lingual ROM: Reduced right Lingual Symmetry: Abnormal symmetry right Lingual Strength: Reduced Lingual Sensation: Reduced Facial ROM: Reduced right Facial Strength: Reduced Facial Sensation: Reduced Velum: Within Functional Limits Mandible: Within Functional Limits Motor Speech Overall Motor Speech: Impaired at baseline   [place] Meno, [place] 06/09/2013, 1:47 PM

## 2013-06-09 NOTE — Progress Notes (Signed)
NEURO HOSPITALIST PROGRESS NOTE   SUBJECTIVE:                                                                                                                        Stated that she is feeling much better and wants something to eat. MRI brain pending.  OBJECTIVE:                                                                                                                           Vital signs in last 24 hours: Temp:  [97.8 F (36.6 C)-98.3 F (36.8 C)] 98.2 F (36.8 C) (05/23 0700) Pulse Rate:  [69-101] 101 (05/23 0700) Resp:  [11-22] 16 (05/23 0700) BP: (94-164)/(60-109) 164/109 mmHg (05/23 0700) SpO2:  [94 %-99 %] 98 % (05/23 0700)  Intake/Output from previous day: 05/22 0701 - 05/23 0700 In: 1200 [I.V.:1200] Out: 150 [Urine:150] Intake/Output this shift:   Nutritional status: NPO  Past Medical History  Diagnosis Date  . Stroke   . Hypertension     Neurologic Exam:  Mental Status:  Alert, oriented, thought content appropriate. Speech fluent without evidence of aphasia with some slurring noted. Able to follow 3 step commands without difficulty.  Cranial Nerves:  II: Discs flat bilaterally; RHH, pupils equal, round, reactive to light and accommodation  III,IV, VI: ptosis not present, extra-ocular motions intact bilaterally  V,VII: right facial droop, facial light touch sensation decreased on the right  VIII: hearing normal bilaterally  IX,X: gag reflex present  XI: shoulder shrug decreased on the right  XII: midline tongue extension  Motor:  Right : Upper extremity 0/5 Left: Upper extremity 5/5  Lower extremity 0/5 Lower extremity 5/5  Tone and bulk:increased tone on the right  Sensory: Pinprick and light touch decreased on the right  Deep Tendon Reflexes: 3+ and symmetric throughout  Plantars:  Right: upgoing Left: upgoing  Cerebellar:  normal finger-to-nose in the LUE  Gait: patient non-ambulatory   Lab Results: Lab  Results  Component Value Date/Time   CHOL 210* 06/09/2013  5:21 AM   Lipid Panel  Recent Labs  06/09/13 0521  CHOL 210*  TRIG 104  HDL 58  CHOLHDL 3.6  VLDL 21  LDLCALC 131*    Studies/Results:  Dg Chest 2 View  06/09/2013   CLINICAL DATA:  CVA.  EXAM: CHEST  2 VIEW  COMPARISON:  None.  FINDINGS: The lungs are well-aerated. Multiple masslike densities are seen at the left lung base, measuring up to 7 cm in size. Though these could reflect a round pneumonia, malignancy is a concern. CT of the chest would be helpful for further evaluation. There is no evidence of pleural effusion or pneumothorax.  The heart is borderline enlarged. No acute osseous abnormalities are seen.  IMPRESSION: 1. Multiple masslike densities at the left lung base, measuring up to 7 cm in size. Though these could reflect a round pneumonia, malignancy is a concern. CT of the chest would be helpful for further evaluation, when and as deemed clinically appropriate. 2. Borderline cardiomegaly.   Electronically Signed   By: Garald Balding M.D.   On: 06/09/2013 04:39   Ct Head Wo Contrast  06/08/2013   CLINICAL DATA:  Code stroke, speech disturbance  EXAM: CT HEAD WITHOUT CONTRAST  TECHNIQUE: Contiguous axial images were obtained from the base of the skull through the vertex without intravenous contrast.  COMPARISON:  None.  FINDINGS: There is ex vacuo dilation of the left lateral ventricle. There is adjacent decreased density in the deep white matter of the of the left cerebral hemisphere. There are milder changes on the right. Mildly increased density in the region of the left thalamus consistent with previous ischemic insult. No acute intracranial hemorrhage. The cerebellum and brainstem are normal in density.  The observed paranasal sinuses and mastoid air cells are clear. There is no acute skull fracture.  IMPRESSION: 1. No acute intracranial hemorrhage nor evidence of an acute ischemic infarction. 2. Chronic changes of  small-vessel ischemia diffusely with focal changes of previous left thalamic CVA. 3. These results were called by telephone at the time of interpretation on 06/08/2013 at 11:05 AM to Dr. Montine Circle , who verbally acknowledged these results.   Electronically Signed   By: David  Martinique   On: 06/08/2013 11:04   US Renal  06/08/2013   CLINICAL DATA:  Elevated creatinine.  EXAM: RENAL/URINARY TRACT ULTRASOUND COMPLETE  COMPARISON:  None.  FINDINGS: Right Kidney:  Length: 10.0 cm. Diffusely increased echogenicity. No mass or hydronephrosis visualized.  Left Kidney:  Length: 10.8 cm. Diffusely increased echogenicity. No mass or hydronephrosis visualized.  Bladder:  Appears normal for degree of bladder distention. The bladder volume at the time imaging is 71 cc.  IMPRESSION: Increased echogenicity of both kidneys suggests chronic medical renal disease. The kidneys are within normal limits for size.   Electronically Signed   By: Curlene Dolphin M.D.   On: 06/08/2013 21:52    MEDICATIONS                                                                                                                       I have reviewed the patient's current medications.  ASSESSMENT/PLAN:  53 y.o. female presenting with complaints of slurred speech and increased right sided weakness, that could be secondary to feeling poorly overall due to her recent GI illness. Awaiting MRI brain. Will follow up.   Dorian Pod, MD [facility] [phone]  06/09/2013, 10:22 AM

## 2013-06-09 NOTE — Progress Notes (Signed)
  Echocardiogram 2D Echocardiogram has been performed.  Carney Corners 06/09/2013, 11:56 AM

## 2013-06-09 NOTE — Evaluation (Signed)
Physical Therapy Evaluation Patient Details Name: Ann Lynch MRN: [id number] DOB: 21-Nov-1960 Today's Date: 06/09/2013   History of Present Illness  53 year-old female with history of CVA with resulting right-sided hemiparesis, hypertension admitted to  ED with slurred speech, worsening weakness.  CT:  1. No acute intracranial hemorrhage nor evidence of an acute ischemic infarction.MRI pending at this time.  Clinical Impression  Pt adm due to the above. Pt near baseline for transfers and mobility. Pt to benefit from skilled acute PT to educate family and maximize patient's functional independence with transfers. Family educated on PROM techniques for Rt LE to prevent contractures. Pt and family appreciative and verbalized understanding of importance of ROM.     Follow Up Recommendations Other (comment);Outpatient PT;Supervision/Assistance - 24 hour (per family they do not qualify for HHPT or OPPT ? )    Equipment Recommendations  None recommended by PT    Recommendations for Other Services OT consult     Precautions / Restrictions Precautions Precautions: Fall Precaution Comments: Rt hemiparesis (due to previous CVA in 2009)  Restrictions Weight Bearing Restrictions: No      Mobility  Bed Mobility Overal bed mobility: Modified Independent             General bed mobility comments: requires handrails and incr time; no physical (A) needed to come to sitting position at Lt side of bed   Transfers Overall transfer level: Needs assistance Equipment used: 1 person hand held assist Transfers: Sit to/from Stand;Stand Pivot Transfers Sit to Stand: From elevated surface;Mod assist Stand pivot transfers: Max assist       General transfer comment: pt able to use Lt UE to pull up on handrail and with use of gt belt and mod (A) can power up through Lt LE to standing; requires increased (A) to perform pivotal step to bed due to Rt hemiparesis; requires max faclitation to  mobilize Rt LE and (A) to control descent to chair   Ambulation/Gait             General Gait Details: non ambulatory at this time  Stairs            Wheelchair Mobility    Modified Rankin (Stroke Patients Only) Modified Rankin (Stroke Patients Only) Pre-Morbid Rankin Score: Severe disability Modified Rankin: Severe disability     Balance Overall balance assessment: Needs assistance Sitting-balance support: Feet supported;Single extremity supported Sitting balance-Leahy Scale: Fair Sitting balance - Comments: sits EOB at supervision level; tolerated ~5 min; cues for upright posture and to return to midline Postural control: Left lateral lean Standing balance support: Single extremity supported;During functional activity Standing balance-Leahy Scale: Poor Standing balance comment: mod-max (A) to maintain balance                             Pertinent Vitals/Pain No c/o pain     Home Living Family/patient expects to be discharged to:: Private residence Living Arrangements: Parent;Other relatives Available Help at Discharge: Family;Available 24 hours/day Type of Home: House Home Access: Ramped entrance     Home Layout: One level Home Equipment: Walker - 2 wheels;Bedside commode;Shower seat;Wheelchair - power;Hospital bed      Prior Function Level of Independence: Needs assistance   Gait / Transfers Assistance Needed: non ambulatory; stand pivot to chairs with mother  ADL's / Homemaking Assistance Needed: feeds herself; pt can (A) with bathing; requires (A) to bath back and LEs  Hand Dominance   Dominant Hand: Right (uses Lt hand now due to Rt hemiparesis )    Extremity/Trunk Assessment   Upper Extremity Assessment: Defer to OT evaluation (Rt hemiparesis )           Lower Extremity Assessment: RLE deficits/detail RLE Deficits / Details: Rt hemiparesis due to previous CVA    Cervical / Trunk Assessment: Kyphotic;Other  exceptions  Communication   Communication: Receptive difficulties  Cognition Arousal/Alertness: Awake/alert Behavior During Therapy: WFL for tasks assessed/performed Overall Cognitive Status: History of cognitive impairments - at baseline                      General Comments General comments (skin integrity, edema, etc.): educated family on PROM of Rt LE to prevent contractures; mother verbalized understanding     Exercises Low Level/ICU Exercises Ankle Circles/Pumps: PROM;Right;10 reps;Supine Heel Slides: PROM;Right;10 reps;Supine      Assessment/Plan    PT Assessment Patient needs continued PT services  PT Diagnosis Generalized weakness   PT Problem List Decreased strength;Decreased range of motion;Decreased activity tolerance;Decreased balance;Decreased mobility;Decreased cognition;Decreased safety awareness;Impaired sensation;Impaired tone  PT Treatment Interventions DME instruction;Functional mobility training;Therapeutic activities;Therapeutic exercise;Balance training;Neuromuscular re-education;Patient/family education;Wheelchair mobility training   PT Goals (Current goals can be found in the Care Plan section) Acute Rehab PT Goals Patient Stated Goal: to go home PT Goal Formulation: With patient/family Time For Goal Achievement: 06/14/13 Potential to Achieve Goals: Fair    Frequency Min 3X/week   Barriers to discharge        Co-evaluation               End of Session Equipment Utilized During Treatment: Gait belt Activity Tolerance: Patient tolerated treatment well Patient left: in chair;with call bell/phone within reach;with chair alarm set;with family/visitor present Nurse Communication: Mobility status;Precautions         Time: 7341-9379 PT Time Calculation (min): 20 min   Charges:   PT Evaluation $Initial PT Evaluation Tier I: 1 Procedure PT Treatments $Therapeutic Activity: 8-22 mins   PT G CodesKennis Carina [place], [place]   [phone] 06/09/2013, 3:23 PM

## 2013-06-09 NOTE — Evaluation (Addendum)
Clinical/Bedside Swallow Evaluation Patient Details  Name: Ann Lynch MRN: [id number] Date of Birth: [birth year]  Today's Date: 06/09/2013 Time: 1130-1200 SLP Time Calculation (min): 30 min  Past Medical History:  Past Medical History  Diagnosis Date  . Stroke   . Hypertension    Past Surgical History:  Past Surgical History  Procedure Laterality Date  . Tracheostomy     HPI:  53 year-old female with history of CVA with resulting right-sided hemiparesis, hypertension brought to  ED with slurred speech, worsening weakness.  CT: 1. No acute intracranial hemorrhage nor evidence of an acute ischemic infarction.  MRI pending.     Assessment / Plan / Recommendation Clinical Impression  BSE completed per Stroke Protocol.  Indicates min to moderate baseline sensory-motor  oral dysphagia with suspected pharyngeal phase  dysphagia.  No outward clinical s/s of aspiration noted but swallow noted to be delayed with all consistencies.   Patient impulsive with decreased oral awareness as required max verbal cues to stop talking during oral prep stage with regular solids.  Recommend to initiate dysphagia 3 (mechanical soft) and thin liquids with full supervision with all meals due to patient's decreased safety awareness.  ST to follow in acute care for diet consistency management secondary to risk factors for aspiration of prior CVA, cognitive deficits, and present weakness.      Aspiration Risk  Moderate    Diet Recommendation Dysphagia 3 (Mechanical Soft);Thin liquid   Liquid Administration via: Cup;Straw Medication Administration: Whole meds with liquid Supervision: Staff to assist with self feeding;Full supervision/cueing for compensatory strategies Compensations: Slow rate;Small sips/bites;Check for pocketing Postural Changes and/or Swallow Maneuvers: Seated upright 90 degrees;Upright 30-60 min after meal    Other  Recommendations Oral Care Recommendations: Oral care Q4 per protocol    Follow Up Recommendations   (TBD)    Frequency and Duration min 2x/week  2 weeks       SLP Swallow Goals Please refer to Care Plans for Listed Goals   Swallow Study Prior Functional Status   Lives at home with family members and receives daily assist with all ADL's.  Per patient on regular consistency and thin liquids.      General Date of Onset: 06/08/13 HPI: -year-old female with history of CVA with resulting right-sided hemiparesis, hypertension (uncontrolled), ? Seizure disorder, currently patient on Dilantin to prevent seizures as per family, but never had seizures, was brought to the ED with slurred speech, worsening weakness with transitioning. Patient has limited mobility at baseline due to old CVA. There was no seizure, denies blurred vision. As per family patient has been sick over the past 2 days, had nausea and vomiting 2 days ago. Also she was nauseous yesterday had very poor by mouth intake. But patient was taking her medications as prescribed. Yesterday patient saw her PCP, and did not have significant symptoms at that time. Type of Study: Bedside swallow evaluation Diet Prior to this Study: NPO Temperature Spikes Noted: No Respiratory Status: Room air History of Recent Intubation: No Behavior/Cognition: Alert;Cooperative;Pleasant mood;Decreased sustained attention;Impulsive Oral Cavity - Dentition: Adequate natural dentition Self-Feeding Abilities: Needs assist Patient Positioning: Upright in bed Baseline Vocal Quality: Clear Volitional Cough: Strong Volitional Swallow: Able to elicit    Oral/Motor/Sensory Function Overall Oral Motor/Sensory Function: Impaired at baseline Labial ROM: Reduced right Labial Symmetry: Abnormal symmetry right Labial Strength: Reduced Labial Sensation: Reduced Lingual ROM: Reduced right Lingual Symmetry: Abnormal symmetry right Lingual Strength: Reduced Lingual Sensation: Reduced Facial ROM: Reduced right Facial Strength:  Reduced Facial  Sensation: Reduced Velum: Within Functional Limits Mandible: Within Functional Limits   Ice Chips Ice chips: Within functional limits   Thin Liquid Thin Liquid: Impaired Pharyngeal  Phase Impairments: Suspected delayed Swallow;Decreased hyoid-laryngeal movement    Nectar Thick Nectar Thick Liquid: Not tested   Honey Thick Honey Thick Liquid: Not tested   Puree Puree: Impaired Oral Phase Impairments: Poor awareness of bolus Oral Phase Functional Implications: Oral residue Pharyngeal Phase Impairments: Suspected delayed Swallow;Decreased hyoid-laryngeal movement   Solid   GO    Solid: Impaired Oral Phase Impairments: Poor awareness of bolus;Impaired mastication Oral Phase Functional Implications: Oral residue Pharyngeal Phase Impairments: Suspected delayed Swallow;Decreased hyoid-laryngeal movement      Ann Lynch, CCC-SLP [phone] Ann Lynch 06/09/2013,12:54 PM

## 2013-06-09 NOTE — Progress Notes (Addendum)
Patient Demographics  Ann Lynch, is a 53 y.o. female, DOB - [birth year], PYP:[id number]  Admit date - 06/08/2013   Admitting Physician Oswald Hillock, MD  Outpatient Primary MD for the patient is No primary provider on file.  LOS - 1   Chief Complaint  Patient presents with  . Aphasia           Subjective:   Ann Lynch today has, No headache, No chest pain, No abdominal pain - No Nausea, No new weakness tingling or numbness, No Cough - SOB. Feels close to her baseline    Assessment & Plan    1.Slurred speech - in a patient with dense left MCA stroke with right-sided hemiparesis and some baseline dysarthria - unlikely new CVA, head CT normal, MRI MRA pending, likely her symptoms were secondary to dehydration caused by GI illness with acute renal failure unmasking her old symptoms. She will be seen by speech, PT OT. Diet will be advanced as tolerated. Await MRI, continue aspirin for secondary prevention, will increase statin dose as LDL is greater than 100.    2. Acute renal failure. Caused by dehydration from gastroenteritis. Improving in close to baseline now with IV fluids which will be continued, renal ultrasound stable. Avoid nephrotoxins. Hold her diuretics and ARB.    3. Hypertension. Will add IV hydralazine for now with written parameters. We'll resume oral clonidine and Lopressor was cleared for by mouth.    4. Dyslipidemia. LDL greater than 100 she is on maximum dose Zocor, will add niacin.   Lab Results  Component Value Date   CHOL 210* 06/09/2013   HDL 58 06/09/2013   LDLCALC 131* 06/09/2013   TRIG 104 06/09/2013   CHOLHDL 3.6 06/09/2013    5. History of seizure order. Dilantin therapeutic continue at home dose.    6. History of depression. On Cymbalta  and Neurontin during once cleared for by mouth and renal function back to baseline.       Code Status: Full  Family Communication: none present  Disposition Plan:  Home   Procedures CT   Consults  Neuro   Medications  Scheduled Meds: . aspirin  300 mg Rectal Daily   Or  . aspirin  325 mg Oral Daily  . enoxaparin (LOVENOX) injection  40 mg Subcutaneous Q24H  . phenytoin  100 mg Oral Daily  . simvastatin  40 mg Oral Daily   Continuous Infusions: . 0.9 % NaCl with KCl 40 mEq / L     PRN Meds:.acetaminophen, acetaminophen, senna-docusate  DVT Prophylaxis  Lovenox   Lab Results  Component Value Date   PLT 197 06/09/2013    Antibiotics    Anti-infectives   None          Objective:   Filed Vitals:   06/09/13 0119 06/09/13 0306 06/09/13 0526 06/09/13 0700  BP: 147/90 143/88 154/96 164/109  Pulse: 93 90 98 101  Temp: 97.9 F (36.6 C) 98.3 F (36.8 C) 97.9 F (36.6 C) 98.2 F (36.8 C)  TempSrc: Oral Oral Oral Oral  Resp: 16 16 16 16   SpO2: 96% 97% 97% 98%    Wt Readings from Last 3 Encounters:  No data found for Wt     Intake/Output Summary (  Last 24 hours) at 06/09/13 1024 Last data filed at 06/09/13 0600  Gross per 24 hour  Intake   1200 ml  Output    150 ml  Net   1050 ml     Physical Exam  Awake Alert, Oriented X 3, old R Facial droop and 0/5 strength from old CVA, No new F.N deficits, Normal affect [place].AT,PERRAL Supple Neck,No JVD, No cervical lymphadenopathy appriciated.  Symmetrical Chest wall movement, Good air movement bilaterally, CTAB RRR,No Gallops,Rubs or new Murmurs, No Parasternal Heave +ve B.Sounds, Abd Soft, No tenderness, No organomegaly appriciated, No rebound - guarding or rigidity. No Cyanosis, Clubbing or edema, No new Rash or bruise     Data Review   Micro Results No results found for this or any previous visit (from the past 240 hour(s)).  Radiology Reports Dg Chest 2 View  06/09/2013   CLINICAL DATA:  CVA.   EXAM: CHEST  2 VIEW  COMPARISON:  None.  FINDINGS: The lungs are well-aerated. Multiple masslike densities are seen at the left lung base, measuring up to 7 cm in size. Though these could reflect a round pneumonia, malignancy is a concern. CT of the chest would be helpful for further evaluation. There is no evidence of pleural effusion or pneumothorax.  The heart is borderline enlarged. No acute osseous abnormalities are seen.  IMPRESSION: 1. Multiple masslike densities at the left lung base, measuring up to 7 cm in size. Though these could reflect a round pneumonia, malignancy is a concern. CT of the chest would be helpful for further evaluation, when and as deemed clinically appropriate. 2. Borderline cardiomegaly.   Electronically Signed   By: Garald Balding M.D.   On: 06/09/2013 04:39   Ct Head Wo Contrast  06/08/2013   CLINICAL DATA:  Code stroke, speech disturbance  EXAM: CT HEAD WITHOUT CONTRAST  TECHNIQUE: Contiguous axial images were obtained from the base of the skull through the vertex without intravenous contrast.  COMPARISON:  None.  FINDINGS: There is ex vacuo dilation of the left lateral ventricle. There is adjacent decreased density in the deep white matter of the of the left cerebral hemisphere. There are milder changes on the right. Mildly increased density in the region of the left thalamus consistent with previous ischemic insult. No acute intracranial hemorrhage. The cerebellum and brainstem are normal in density.  The observed paranasal sinuses and mastoid air cells are clear. There is no acute skull fracture.  IMPRESSION: 1. No acute intracranial hemorrhage nor evidence of an acute ischemic infarction. 2. Chronic changes of small-vessel ischemia diffusely with focal changes of previous left thalamic CVA. 3. These results were called by telephone at the time of interpretation on 06/08/2013 at 11:05 AM to Dr. Montine Circle , who verbally acknowledged these results.   Electronically Signed    By: David  [place]   On: 06/08/2013 11:04    US Renal  06/08/2013   CLINICAL DATA:  Elevated creatinine.  EXAM: RENAL/URINARY TRACT ULTRASOUND COMPLETE  COMPARISON:  None.  FINDINGS: Right Kidney:  Length: 10.0 cm. Diffusely increased echogenicity. No mass or hydronephrosis visualized.  Left Kidney:  Length: 10.8 cm. Diffusely increased echogenicity. No mass or hydronephrosis visualized.  Bladder:  Appears normal for degree of bladder distention. The bladder volume at the time imaging is 71 cc.  IMPRESSION: Increased echogenicity of both kidneys suggests chronic medical renal disease. The kidneys are within normal limits for size.   Electronically Signed   By: Audelia Acton.D.  On: 06/08/2013 21:52    CBC  Recent Labs Lab 06/08/13 1028 06/08/13 1035 06/08/13 2247 06/09/13 0521  WBC 6.9  --  7.0 6.1  HGB 13.6 15.3* 13.7 13.4  HCT 41.6 45.0 41.7 40.7  PLT 219  --  177 197  MCV 93.9  --  96.3 95.8  MCH 30.7  --  31.6 31.5  MCHC 32.7  --  32.9 32.9  RDW 13.3  --  13.3 13.4  LYMPHSABS 2.1  --   --   --   MONOABS 0.7  --   --   --   EOSABS 0.1  --   --   --   BASOSABS 0.0  --   --   --     Chemistries   Recent Labs Lab 06/08/13 1028 06/08/13 1035 06/09/13 0521  NA 140 140 142  K 3.8 3.6* 3.5*  CL 97 101 101  CO2 27  --  26  GLUCOSE 113* 116* 75  BUN 37* 36* 29*  CREATININE 2.38* 2.50* 1.15*  CALCIUM 9.6  --  9.1  AST 26  --  21  ALT 22  --  17  ALKPHOS 106  --  104  BILITOT <0.2*  --  0.3   ------------------------------------------------------------------------------------------------------------------ CrCl is unknown because there is no height on file for the current visit. ------------------------------------------------------------------------------------------------------------------ No results found for this basename: HGBA1C,  in the last 72  hours ------------------------------------------------------------------------------------------------------------------  Recent Labs  06/09/13 0521  CHOL 210*  HDL 58  LDLCALC 131*  TRIG 104  CHOLHDL 3.6   ------------------------------------------------------------------------------------------------------------------ No results found for this basename: TSH, T4TOTAL, FREET3, T3FREE, THYROIDAB,  in the last 72 hours ------------------------------------------------------------------------------------------------------------------ No results found for this basename: VITAMINB12, FOLATE, FERRITIN, TIBC, IRON, RETICCTPCT,  in the last 72 hours  Coagulation profile  Recent Labs Lab 06/08/13 1028  INR 1.03    No results found for this basename: DDIMER,  in the last 72 hours  Cardiac Enzymes No results found for this basename: CK, CKMB, TROPONINI, MYOGLOBIN,  in the last 168 hours ------------------------------------------------------------------------------------------------------------------ No components found with this basename: POCBNP,      Time Spent in minutes   35   Thurnell Lose M.D on 06/09/2013 at 10:24 AM  Between 7am to 7pm - Pager - [phone]  After 7pm go to [email or web address] - password [email or web address]  And look for the night coverage person covering for me after hours  [facility] Office  [phone]   **Disclaimer: This note may have been dictated with voice recognition software. Similar sounding words can inadvertently be transcribed and this note may contain transcription errors which may not have been corrected upon publication of note.**

## 2013-06-09 NOTE — Progress Notes (Signed)
OT Cancellation Note  Patient Details Name: Ann Lynch MRN: [id number] DOB: 20-Oct-1960   Cancelled Treatment:    Reason Eval/Treat Not Completed: Patient at procedure or test/ unavailable Pt at MRI. Will attempt later or see in am. [facility], OTR/L  [phone] 06/09/2013 06/09/2013, 3:43 PM

## 2013-06-10 LAB — BASIC METABOLIC PANEL
BUN: 13 mg/dL (ref 6–23)
CO2: 23 mEq/L (ref 19–32)
Calcium: 9.3 mg/dL (ref 8.4–10.5)
Chloride: 101 mEq/L (ref 96–112)
Creatinine, Ser: 0.76 mg/dL (ref 0.50–1.10)
Glucose, Bld: 95 mg/dL (ref 70–99)
POTASSIUM: 3.9 meq/L (ref 3.7–5.3)
SODIUM: 138 meq/L (ref 137–147)

## 2013-06-10 MED ORDER — CLONIDINE HCL 0.3 MG PO TABS
0.3000 mg | ORAL_TABLET | Freq: Four times a day (QID) | ORAL | Status: DC
  Administered 2013-06-10 (×2): 0.3 mg via ORAL
  Filled 2013-06-10 (×3): qty 1

## 2013-06-10 MED ORDER — GEMFIBROZIL 600 MG PO TABS: 600.0000 mg | ORAL_TABLET | Freq: Two times a day (BID) | ORAL | Status: DC

## 2013-06-10 MED ORDER — SIMVASTATIN 40 MG PO TABS
40.0000 mg | ORAL_TABLET | Freq: Every day | ORAL | Status: DC
  Administered 2013-06-10: 40 mg via ORAL
  Filled 2013-06-10: qty 1

## 2013-06-10 MED ORDER — METOPROLOL TARTRATE 100 MG PO TABS
100.0000 mg | ORAL_TABLET | Freq: Two times a day (BID) | ORAL | Status: DC
  Administered 2013-06-10: 100 mg via ORAL
  Filled 2013-06-10 (×2): qty 1

## 2013-06-10 MED ORDER — PHENYTOIN SODIUM EXTENDED 100 MG PO CAPS
100.0000 mg | ORAL_CAPSULE | Freq: Every day | ORAL | Status: DC
  Administered 2013-06-10: 100 mg via ORAL
  Filled 2013-06-10: qty 1

## 2013-06-10 MED ORDER — CLONIDINE HCL 0.2 MG PO TABS
0.2000 mg | ORAL_TABLET | Freq: Four times a day (QID) | ORAL | Status: DC
  Administered 2013-06-10: 0.2 mg via ORAL
  Filled 2013-06-10 (×4): qty 1

## 2013-06-10 MED ORDER — CLONIDINE HCL 0.3 MG PO TABS: 0.3000 mg | ORAL_TABLET | Freq: Four times a day (QID) | ORAL | Status: DC

## 2013-06-10 MED ORDER — HYDRALAZINE HCL 50 MG PO TABS
50.0000 mg | ORAL_TABLET | Freq: Four times a day (QID) | ORAL | Status: DC
  Administered 2013-06-10 (×3): 50 mg via ORAL
  Filled 2013-06-10 (×4): qty 1

## 2013-06-10 NOTE — Care Management Note (Addendum)
    Page 1 of 1   06/10/2013     5:01:39 PM CARE MANAGEMENT NOTE 06/10/2013  Patient:  Ann Lynch,Ann Lynch   Account Number:  [id number]  Date Initiated:  06/10/2013  Documentation initiated by:  [facility]  Subjective/Objective Assessment:   adm: aphasia     Action/Plan:   discharge planning   Anticipated DC Date:  06/10/2013   Anticipated DC Plan:  [place]  CM consult      [place]   Choice offered to / List presented to:  C-1 Patient        [place] arranged  [place].   Status of service:  Completed, signed off Medicare Important Message given?   (If response is "NO", the following Medicare IM given date fields will be blank) Date Medicare IM given:   Date Additional Medicare IM given:    Discharge Disposition:  [place]  Per UR Regulation:    If discussed at Long Length of Stay Meetings, dates discussed:    Comments:  06/10/13 16:55 HHPT/OT added on and relayed to [facility].  No other CM needs were communicated.  Mariane Masters, BSN, CM [phone]. 16:50 CM spoke with pt for choice.  Pt gives permission to set up [place] with [facility].  Referral texted to [facility] rep [place]. No DMe requested.  No other CM needs were communicated. Mariane Masters, BSN, CM [phone].

## 2013-06-10 NOTE — Progress Notes (Signed)
Pt d/c to home by car with family. Assessment stable.  

## 2013-06-10 NOTE — Progress Notes (Signed)
NEURO HOSPITALIST PROGRESS NOTE   SUBJECTIVE:                                                                                                                        Feeling great, in good spirits, wants to go home. MRI brain negative for acute abnormality.  OBJECTIVE:                                                                                                                           Vital signs in last 24 hours: Temp:  [97.7 F (36.5 C)-98.8 F (37.1 C)] 98.5 F (36.9 C) (05/24 0509) Pulse Rate:  [71-105] 73 (05/24 0509) Resp:  [16-18] 18 (05/24 0509) BP: (127-183)/(87-112) 183/110 mmHg (05/24 0509) SpO2:  [97 %-100 %] 100 % (05/24 0509)  Intake/Output from previous day:   Intake/Output this shift:   Nutritional status: Dysphagia  Past Medical History  Diagnosis Date  . Stroke   . Hypertension     Neurologic Exam:  Mental Status:  Alert, oriented, thought content appropriate. Speech fluent without evidence of aphasia with some slurring noted. Able to follow 3 step commands without difficulty.  Cranial Nerves:  II: Discs flat bilaterally; RHH, pupils equal, round, reactive to light and accommodation  III,IV, VI: ptosis not present, extra-ocular motions intact bilaterally  V,VII: right facial droop, facial light touch sensation decreased on the right  VIII: hearing normal bilaterally  IX,X: gag reflex present  XI: shoulder shrug decreased on the right  XII: midline tongue extension  Motor:  Right : Upper extremity 0/5 Left: Upper extremity 5/5  Lower extremity 0/5 Lower extremity 5/5  Tone and bulk:increased tone on the right  Sensory: Pinprick and light touch decreased on the right  Deep Tendon Reflexes: 3+ and symmetric throughout  Plantars:  Right: upgoing Left: upgoing  Cerebellar:  normal finger-to-nose in the LUE  Gait: patient non-ambulatory   Lab Results: Lab Results  Component Value Date/Time   CHOL 210*  06/09/2013  5:21 AM   Lipid Panel  Recent Labs  06/09/13 0521  CHOL 210*  TRIG 104  HDL 58  CHOLHDL 3.6  VLDL 21  LDLCALC 131*    Studies/Results: Dg Chest 2 View  06/09/2013   CLINICAL  DATA:  CVA.  EXAM: CHEST  2 VIEW  COMPARISON:  None.  FINDINGS: The lungs are well-aerated. Multiple masslike densities are seen at the left lung base, measuring up to 7 cm in size. Though these could reflect a round pneumonia, malignancy is a concern. CT of the chest would be helpful for further evaluation. There is no evidence of pleural effusion or pneumothorax.  The heart is borderline enlarged. No acute osseous abnormalities are seen.  IMPRESSION: 1. Multiple masslike densities at the left lung base, measuring up to 7 cm in size. Though these could reflect a round pneumonia, malignancy is a concern. CT of the chest would be helpful for further evaluation, when and as deemed clinically appropriate. 2. Borderline cardiomegaly.   Electronically Signed   By: Garald Balding M.D.   On: 06/09/2013 04:39   Ct Head Wo Contrast  06/08/2013   CLINICAL DATA:  Code stroke, speech disturbance  EXAM: CT HEAD WITHOUT CONTRAST  TECHNIQUE: Contiguous axial images were obtained from the base of the skull through the vertex without intravenous contrast.  COMPARISON:  None.  FINDINGS: There is ex vacuo dilation of the left lateral ventricle. There is adjacent decreased density in the deep white matter of the of the left cerebral hemisphere. There are milder changes on the right. Mildly increased density in the region of the left thalamus consistent with previous ischemic insult. No acute intracranial hemorrhage. The cerebellum and brainstem are normal in density.  The observed paranasal sinuses and mastoid air cells are clear. There is no acute skull fracture.  IMPRESSION: 1. No acute intracranial hemorrhage nor evidence of an acute ischemic infarction. 2. Chronic changes of small-vessel ischemia diffusely with focal changes of  previous left thalamic CVA. 3. These results were called by telephone at the time of interpretation on 06/08/2013 at 11:05 AM to Dr. Montine Circle , who verbally acknowledged these results.   Electronically Signed   By: David  Martinique   On: 06/08/2013 11:04   Mr Brain Wo Contrast  06/09/2013   CLINICAL DATA:  Previous stroke in 2009 with right-sided weakness and seizures. Nausea and vomiting with some slurred speech.  EXAM: MRI HEAD WITHOUT CONTRAST  MRA HEAD WITHOUT CONTRAST  TECHNIQUE: Multiplanar, multiecho pulse sequences of the brain and surrounding structures were obtained without intravenous contrast. Angiographic images of the head were obtained using MRA technique without contrast.  COMPARISON:  CT head 06/08/2013.  FINDINGS: MRI HEAD FINDINGS  No evidence for acute infarction, hemorrhage, mass lesion, hydrocephalus, or extra-axial fluid. Remote left thalamic infarct displays extensive encephalomalacia and chronic blood products. Wallerian degeneration extends to the left corticospinal tract.  There is premature atrophy with chronic microvascular ischemic change. Scattered areas of remote lacunar infarction are observed. Flow voids are maintained. Unremarkable midline structures. Negative osseous structures. No acute sinus or mastoid disease.  On gradient sequence, widespread foci of chronic hemorrhage are scattered throughout both cerebral hemispheres as well as the brainstem and cerebellum. These are indicative of chronic cerebrovascular disease secondary to hypertensive cerebral vascular disease, in all likelihood. Cerebral amyloid angiopathy less favored.  MRA HEAD FINDINGS  The internal carotid arteries are widely patent. The basilar artery is widely patent with right vertebral dominant. Mild irregularity of the proximal middle cerebral arteries without vessel stenosis. Mild irregularity proximal anterior cerebral arteries. Fetal origin left PCA. No proximal PCA stenosis. No cerebellar branch  occlusion. Mild irregularity of the distal ACA, MCA and PCA branches consistent with intracranial atherosclerotic change. No intracranial aneurysm.  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 Atrophy and small vessel disease. No acute intracranial findings.  Widespread microbleeds throughout the brain suggesting longstanding hypertensive cerebrovascular disease.  No proximal flow reducing lesion of the intracranial circulation.   Electronically Signed   By: Rolla Flatten M.D.   On: 06/09/2013 16:40   US Renal  06/08/2013   CLINICAL DATA:  Elevated creatinine.  EXAM: RENAL/URINARY TRACT ULTRASOUND COMPLETE  COMPARISON:  None.  FINDINGS: Right Kidney:  Length: 10.0 cm. Diffusely increased echogenicity. No mass or hydronephrosis visualized.  Left Kidney:  Length: 10.8 cm. Diffusely increased echogenicity. No mass or hydronephrosis visualized.  Bladder:  Appears normal for degree of bladder distention. The bladder volume at the time imaging is 71 cc.  IMPRESSION: Increased echogenicity of both kidneys suggests chronic medical renal disease. The kidneys are within normal limits for size.   Electronically Signed   By: Curlene Dolphin M.D.   On: 06/08/2013 21:52   Mr Jodene Nam Head/brain Wo Cm  06/09/2013   CLINICAL DATA:  Previous stroke in 2009 with right-sided weakness and seizures. Nausea and vomiting with some slurred speech.  EXAM: MRI HEAD WITHOUT CONTRAST  MRA HEAD WITHOUT CONTRAST  TECHNIQUE: Multiplanar, multiecho pulse sequences of the brain and surrounding structures were obtained without intravenous contrast. Angiographic images of the head were obtained using MRA technique without contrast.  COMPARISON:  CT head 06/08/2013.  FINDINGS: MRI HEAD FINDINGS  No evidence for acute infarction, hemorrhage, mass lesion, hydrocephalus, or extra-axial fluid. Remote left thalamic infarct displays extensive encephalomalacia and chronic blood products. Wallerian degeneration extends to the  left corticospinal tract.  There is premature atrophy with chronic microvascular ischemic change. Scattered areas of remote lacunar infarction are observed. Flow voids are maintained. Unremarkable midline structures. Negative osseous structures. No acute sinus or mastoid disease.  On gradient sequence, widespread foci of chronic hemorrhage are scattered throughout both cerebral hemispheres as well as the brainstem and cerebellum. These are indicative of chronic cerebrovascular disease secondary to hypertensive cerebral vascular disease, in all likelihood. Cerebral amyloid angiopathy less favored.  MRA HEAD FINDINGS  The internal carotid arteries are widely patent. The basilar artery is widely patent with right vertebral dominant. Mild irregularity of the proximal middle cerebral arteries without vessel stenosis. Mild irregularity proximal anterior cerebral arteries. Fetal origin left PCA. No proximal PCA stenosis. No cerebellar branch occlusion. Mild irregularity of the distal ACA, MCA and PCA branches consistent with intracranial atherosclerotic change. No intracranial aneurysm.  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 Atrophy and small vessel disease. No acute intracranial findings.  Widespread microbleeds throughout the brain suggesting longstanding hypertensive cerebrovascular disease.  No proximal flow reducing lesion of the intracranial circulation.   Electronically Signed   By: Rolla Flatten M.D.   On: 06/09/2013 16:40    MEDICATIONS  I have reviewed the patient's current medications.  ASSESSMENT/PLAN:                                                                                                            53 y.o. female admitted to [facility] with complaints of slurred speech and increased right sided weakness, most likely  secondary to feeling poorly  overall due to her recent GI illness. MRI brain negative for acute abnormality. She is back to baseline and should be able to go home today unless there is an active medical issue that precludes her form being discharge. Neurology will sign off.  Dorian Pod, MD [facility] [phone]  06/10/2013, 9:15 AM

## 2013-06-10 NOTE — Discharge Instructions (Signed)
Follow with Primary MD in 7 days   Get CBC, CMP, checked 7 days by Primary MD and again as instructed by your Primary MD.    Activity: As tolerated with Full fall precautions use walker/cane & assistance as needed   Disposition Home    Diet: Dysphagia 3 Heart Healthy with feeding assistance and aspiration precautions.  For Heart failure patients - Check your Weight same time everyday, if you gain over 2 pounds, or you develop in leg swelling, experience more shortness of breath or chest pain, call your Primary MD immediately. Follow Cardiac Low Salt Diet and 1.8 lit/day fluid restriction.   On your next visit with her primary care physician please Get Medicines reviewed and adjusted.  Please request your Prim.MD to go over all Hospital Tests and Procedure/Radiological results at the follow up, please get all Hospital records sent to your Prim MD by signing hospital release before you go home.   If you experience worsening of your admission symptoms, develop shortness of breath, life threatening emergency, suicidal or homicidal thoughts you must seek medical attention immediately by calling 911 or calling your MD immediately  if symptoms less severe.  You Must read complete instructions/literature along with all the possible adverse reactions/side effects for all the Medicines you take and that have been prescribed to you. Take any new Medicines after you have completely understood and accpet all the possible adverse reactions/side effects.   Do not drive and provide baby sitting services if your were admitted for syncope or siezures until you have seen by Primary MD or a Neurologist and advised to do so again.  Do not drive when taking Pain medications.    Do not take more than prescribed Pain, Sleep and Anxiety Medications  Special Instructions: If you have smoked or chewed Tobacco  in the last 2 yrs please stop smoking, stop any regular Alcohol  and or any Recreational drug  use.  Wear Seat belts while driving.   Please note  You were cared for by a hospitalist during your hospital stay. If you have any questions about your discharge medications or the care you received while you were in the hospital after you are discharged, you can call the unit and asked to speak with the hospitalist on call if the hospitalist that took care of you is not available. Once you are discharged, your primary care physician will handle any further medical issues. Please note that NO REFILLS for any discharge medications will be authorized once you are discharged, as it is imperative that you return to your primary care physician (or establish a relationship with a primary care physician if you do not have one) for your aftercare needs so that they can reassess your need for medications and monitor your lab values.

## 2013-06-10 NOTE — Progress Notes (Signed)
OT Cancellation Note  Patient Details Name: Ann Lynch MRN: [id number] DOB: [birth year]   Cancelled Treatment:    Reason Eval/Treat Not Completed: Other (comment) Spoke with nurse and pt is discharging soon-waiting on mom to arrive. Pt set up with [facility]. Deferring all further needs to [facility].   Benito Mccreedy OTR/L [phone] 06/10/2013, 5:26 PM

## 2013-06-10 NOTE — Discharge Summary (Signed)
Ann Lynch, is a 53 y.o. female  DOB 03/02/1960  MRN [id number].  Admission date:  06/08/2013  Admitting Physician  Oswald Hillock, MD  Discharge Date:  06/10/2013   Primary MD  Maggie Font, MD  Recommendations for primary care physician for things to follow:   Monitor blood pressure closely and second wrist factors for stroke   Admission Diagnosis  Aphasia [784.3] HTN (hypertension) [401.9] AKI (acute kidney injury) [584.9] CVA, old, hemiparesis [438.20]   Discharge Diagnosis  Aphasia [784.3] HTN (hypertension) [401.9] AKI (acute kidney injury) [584.9] CVA, old, hemiparesis [438.20]    Acute renal failure with dehydration causing metabolic encephalopathy  Principal Problem:   Aphasia Active Problems:   Slurred speech   AKI (acute kidney injury)   CVA, old, hemiparesis   HTN (hypertension)      Past Medical History  Diagnosis Date  . Stroke   . Hypertension     Past Surgical History  Procedure Laterality Date  . Tracheostomy       Discharge Condition: Stable  Follow UP  Follow-up Information   Follow up with PCP. Schedule an appointment as soon as possible for a visit in 1 week.        Discharge Instructions  and  Discharge Medications          Discharge Instructions   Discharge instructions    Complete by:  As directed   Follow with Primary MD in 7 days   Get CBC, CMP, checked 7 days by Primary MD and again as instructed by your Primary MD.    Activity: As tolerated with Full fall precautions use walker/cane & assistance as needed   Disposition Home    Diet: Dysphagia 3 Heart Healthy with feeding assistance and aspiration precautions.  For Heart failure patients - Check your Weight same time everyday, if you gain over 2 pounds, or you develop in leg swelling, experience more  shortness of breath or chest pain, call your Primary MD immediately. Follow Cardiac Low Salt Diet and 1.8 lit/day fluid restriction.   On your next visit with her primary care physician please Get Medicines reviewed and adjusted.  Please request your Prim.MD to go over all Hospital Tests and Procedure/Radiological results at the follow up, please get all Hospital records sent to your Prim MD by signing hospital release before you go home.   If you experience worsening of your admission symptoms, develop shortness of breath, life threatening emergency, suicidal or homicidal thoughts you must seek medical attention immediately by calling 911 or calling your MD immediately  if symptoms less severe.  You Must read complete instructions/literature along with all the possible adverse reactions/side effects for all the Medicines you take and that have been prescribed to you. Take any new Medicines after you have completely understood and accpet all the possible adverse reactions/side effects.   Do not drive and provide baby sitting services if your were admitted for syncope or siezures until you have seen by Primary MD or a Neurologist and advised to  do so again.  Do not drive when taking Pain medications.    Do not take more than prescribed Pain, Sleep and Anxiety Medications  Special Instructions: If you have smoked or chewed Tobacco  in the last 2 yrs please stop smoking, stop any regular Alcohol  and or any Recreational drug use.  Wear Seat belts while driving.   Please note  You were cared for by a hospitalist during your hospital stay. If you have any questions about your discharge medications or the care you received while you were in the hospital after you are discharged, you can call the unit and asked to speak with the hospitalist on call if the hospitalist that took care of you is not available. Once you are discharged, your primary care physician will handle any further medical issues.  Please note that NO REFILLS for any discharge medications will be authorized once you are discharged, as it is imperative that you return to your primary care physician (or establish a relationship with a primary care physician if you do not have one) for your aftercare needs so that they can reassess your need for medications and monitor your lab values.  Follow with Primary MD in 7 days   Get CBC, CMP, checked 7 days by Primary MD and again as instructed by your Primary MD.    Activity: As tolerated with Full fall precautions use walker/cane & assistance as needed   Disposition Home    Diet: Dysphagia 3 Heart Healthy with feeding assistance and aspiration precautions.  For Heart failure patients - Check your Weight same time everyday, if you gain over 2 pounds, or you develop in leg swelling, experience more shortness of breath or chest pain, call your Primary MD immediately. Follow Cardiac Low Salt Diet and 1.8 lit/day fluid restriction.   On your next visit with her primary care physician please Get Medicines reviewed and adjusted.  Please request your Prim.MD to go over all Hospital Tests and Procedure/Radiological results at the follow up, please get all Hospital records sent to your Prim MD by signing hospital release before you go home.   If you experience worsening of your admission symptoms, develop shortness of breath, life threatening emergency, suicidal or homicidal thoughts you must seek medical attention immediately by calling 911 or calling your MD immediately  if symptoms less severe.  You Must read complete instructions/literature along with all the possible adverse reactions/side effects for all the Medicines you take and that have been prescribed to you. Take any new Medicines after you have completely understood and accpet all the possible adverse reactions/side effects.   Do not drive and provide baby sitting services if your were admitted for syncope or siezures until  you have seen by Primary MD or a Neurologist and advised to do so again.  Do not drive when taking Pain medications.    Do not take more than prescribed Pain, Sleep and Anxiety Medications  Special Instructions: If you have smoked or chewed Tobacco  in the last 2 yrs please stop smoking, stop any regular Alcohol  and or any Recreational drug use.  Wear Seat belts while driving.   Please note  You were cared for by a hospitalist during your hospital stay. If you have any questions about your discharge medications or the care you received while you were in the hospital after you are discharged, you can call the unit and asked to speak with the hospitalist on call if the hospitalist that took care  of you is not available. Once you are discharged, your primary care physician will handle any further medical issues. Please note that NO REFILLS for any discharge medications will be authorized once you are discharged, as it is imperative that you return to your primary care physician (or establish a relationship with a primary care physician if you do not have one) for your aftercare needs so that they can reassess your need for medications and monitor your lab values.     Increase activity slowly    Complete by:  As directed             Medication List         amitriptyline 25 MG tablet  Commonly known as:  ELAVIL  Take 25 mg by mouth at bedtime.     cloNIDine 0.3 MG tablet  Commonly known as:  CATAPRES  Take 1 tablet (0.3 mg total) by mouth 4 (four) times daily.     CYMBALTA 20 MG capsule  Generic drug:  DULoxetine  Take 20 mg by mouth daily.     DILANTIN 100 MG ER capsule  Generic drug:  phenytoin  Take by mouth daily.     gabapentin 400 MG capsule  Commonly known as:  NEURONTIN  Take 400 mg by mouth 3 (three) times daily.     gemfibrozil 600 MG tablet  Commonly known as:  LOPID  Take 1 tablet (600 mg total) by mouth 2 (two) times daily before a meal.     hydrALAZINE 50 MG  tablet  Commonly known as:  APRESOLINE  Take 50 mg by mouth 4 (four) times daily.     metoprolol 100 MG tablet  Commonly known as:  LOPRESSOR  Take 100 mg by mouth 2 (two) times daily.     simvastatin 40 MG tablet  Commonly known as:  ZOCOR  Take 40 mg by mouth daily.     spironolactone 25 MG tablet  Commonly known as:  ALDACTONE  Take 25 mg by mouth 3 (three) times daily.     telmisartan-hydrochlorothiazide 80-12.5 MG per tablet  Commonly known as:  MICARDIS HCT  Take 1 tablet by mouth daily.          Diet and Activity recommendation: See Discharge Instructions above   Consults obtained - neuro   Major procedures and Radiology Reports - PLEASE review detailed and final reports for all details, in brief -     Echo  - Left ventricle: The cavity size was normal. Wall thickness was increased in a pattern of moderate to severe LVH. Systolic function was vigorous. The estimated ejection fraction was in the range of 65% to 70%. There was dynamic obstruction, with mid-cavity obliteration, a peak velocity of 2 cm/sec, and a peak gradient of 16 mm Hg. Wall motion was normal; there were no regional wall motion abnormalities. Doppler parameters are consistent with abnormal left ventricular relaxation (grade 1 diastolic dysfunction).  Impressions:  - No cardiac source of emboli was indentified     Dg Chest 2 View  06/09/2013   CLINICAL DATA:  CVA.  EXAM: CHEST  2 VIEW  COMPARISON:  None.  FINDINGS: The lungs are well-aerated. Multiple masslike densities are seen at the left lung base, measuring up to 7 cm in size. Though these could reflect a round pneumonia, malignancy is a concern. CT of the chest would be helpful for further evaluation. There is no evidence of pleural effusion or pneumothorax.  The heart is borderline enlarged. No acute  osseous abnormalities are seen.  IMPRESSION: 1. Multiple masslike densities at the left lung base, measuring up to 7 cm in size. Though  these could reflect a round pneumonia, malignancy is a concern. CT of the chest would be helpful for further evaluation, when and as deemed clinically appropriate. 2. Borderline cardiomegaly.   Electronically Signed   By: Garald Balding M.D.   On: 06/09/2013 04:39   Ct Head Wo Contrast  06/08/2013   CLINICAL DATA:  Code stroke, speech disturbance  EXAM: CT HEAD WITHOUT CONTRAST  TECHNIQUE: Contiguous axial images were obtained from the base of the skull through the vertex without intravenous contrast.  COMPARISON:  None.  FINDINGS: There is ex vacuo dilation of the left lateral ventricle. There is adjacent decreased density in the deep white matter of the of the left cerebral hemisphere. There are milder changes on the right. Mildly increased density in the region of the left thalamus consistent with previous ischemic insult. No acute intracranial hemorrhage. The cerebellum and brainstem are normal in density.  The observed paranasal sinuses and mastoid air cells are clear. There is no acute skull fracture.  IMPRESSION: 1. No acute intracranial hemorrhage nor evidence of an acute ischemic infarction. 2. Chronic changes of small-vessel ischemia diffusely with focal changes of previous left thalamic CVA. 3. These results were called by telephone at the time of interpretation on 06/08/2013 at 11:05 AM to Dr. Montine Circle , who verbally acknowledged these results.   Electronically Signed   By: David  Martinique   On: 06/08/2013 11:04   Mr Brain Wo Contrast  06/09/2013   CLINICAL DATA:  Previous stroke in 2009 with right-sided weakness and seizures. Nausea and vomiting with some slurred speech.  EXAM: MRI HEAD WITHOUT CONTRAST  MRA HEAD WITHOUT CONTRAST  TECHNIQUE: Multiplanar, multiecho pulse sequences of the brain and surrounding structures were obtained without intravenous contrast. Angiographic images of the head were obtained using MRA technique without contrast.  COMPARISON:  CT head 06/08/2013.  FINDINGS: MRI  HEAD FINDINGS  No evidence for acute infarction, hemorrhage, mass lesion, hydrocephalus, or extra-axial fluid. Remote left thalamic infarct displays extensive encephalomalacia and chronic blood products. Wallerian degeneration extends to the left corticospinal tract.  There is premature atrophy with chronic microvascular ischemic change. Scattered areas of remote lacunar infarction are observed. Flow voids are maintained. Unremarkable midline structures. Negative osseous structures. No acute sinus or mastoid disease.  On gradient sequence, widespread foci of chronic hemorrhage are scattered throughout both cerebral hemispheres as well as the brainstem and cerebellum. These are indicative of chronic cerebrovascular disease secondary to hypertensive cerebral vascular disease, in all likelihood. Cerebral amyloid angiopathy less favored.  MRA HEAD FINDINGS  The internal carotid arteries are widely patent. The basilar artery is widely patent with right vertebral dominant. Mild irregularity of the proximal middle cerebral arteries without vessel stenosis. Mild irregularity proximal anterior cerebral arteries. Fetal origin left PCA. No proximal PCA stenosis. No cerebellar branch occlusion. Mild irregularity of the distal ACA, MCA and PCA branches consistent with intracranial atherosclerotic change. No intracranial aneurysm.  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 Atrophy and small vessel disease. No acute intracranial findings.  Widespread microbleeds throughout the brain suggesting longstanding hypertensive cerebrovascular disease.  No proximal flow reducing lesion of the intracranial circulation.   Electronically Signed   By: Rolla Flatten M.D.   On: 06/09/2013 16:40   US Renal  06/08/2013   CLINICAL DATA:  Elevated creatinine.  EXAM: RENAL/URINARY TRACT ULTRASOUND COMPLETE  COMPARISON:  None.  FINDINGS: Right Kidney:  Length: 10.0 cm. Diffusely increased echogenicity. No mass or  hydronephrosis visualized.  Left Kidney:  Length: 10.8 cm. Diffusely increased echogenicity. No mass or hydronephrosis visualized.  Bladder:  Appears normal for degree of bladder distention. The bladder volume at the time imaging is 71 cc.  IMPRESSION: Increased echogenicity of both kidneys suggests chronic medical renal disease. The kidneys are within normal limits for size.   Electronically Signed   By: Curlene Dolphin M.D.   On: 06/08/2013 21:52   Mr Jodene Nam Head/brain Wo Cm  06/09/2013   CLINICAL DATA:  Previous stroke in 2009 with right-sided weakness and seizures. Nausea and vomiting with some slurred speech.  EXAM: MRI HEAD WITHOUT CONTRAST  MRA HEAD WITHOUT CONTRAST  TECHNIQUE: Multiplanar, multiecho pulse sequences of the brain and surrounding structures were obtained without intravenous contrast. Angiographic images of the head were obtained using MRA technique without contrast.  COMPARISON:  CT head 06/08/2013.  FINDINGS: MRI HEAD FINDINGS  No evidence for acute infarction, hemorrhage, mass lesion, hydrocephalus, or extra-axial fluid. Remote left thalamic infarct displays extensive encephalomalacia and chronic blood products. Wallerian degeneration extends to the left corticospinal tract.  There is premature atrophy with chronic microvascular ischemic change. Scattered areas of remote lacunar infarction are observed. Flow voids are maintained. Unremarkable midline structures. Negative osseous structures. No acute sinus or mastoid disease.  On gradient sequence, widespread foci of chronic hemorrhage are scattered throughout both cerebral hemispheres as well as the brainstem and cerebellum. These are indicative of chronic cerebrovascular disease secondary to hypertensive cerebral vascular disease, in all likelihood. Cerebral amyloid angiopathy less favored.  MRA HEAD FINDINGS  The internal carotid arteries are widely patent. The basilar artery is widely patent with right vertebral dominant. Mild irregularity  of the proximal middle cerebral arteries without vessel stenosis. Mild irregularity proximal anterior cerebral arteries. Fetal origin left PCA. No proximal PCA stenosis. No cerebellar branch occlusion. Mild irregularity of the distal ACA, MCA and PCA branches consistent with intracranial atherosclerotic change. No intracranial aneurysm.  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 Atrophy and small vessel disease. No acute intracranial findings.  Widespread microbleeds throughout the brain suggesting longstanding hypertensive cerebrovascular disease.  No proximal flow reducing lesion of the intracranial circulation.   Electronically Signed   By: Rolla Flatten M.D.   On: 06/09/2013 16:40    Micro Results      No results found for this or any previous visit (from the past 240 hour(s)).   History of present illness and  Hospital Course:     Kindly see H&P for history of present illness and admission details, please review complete Labs, Consult reports and Test reports for all details in brief Ann Lynch, is a 53 y.o. female, patient with history of   CVA with resulting right-sided hemiparesis, hypertension (uncontrolled), ? Seizure disorder, currently patient on Dilantin to prevent seizures as per family, but never had seizures, was brought to the ED with slurred speech, worsening weakness with transitioning. Patient has limited mobility at baseline due to old CVA. There was no seizure, denies blurred vision. As per family patient has been sick over the past 2 days, had nausea and vomiting 2 days ago, her workup was consistent with dehydration with acute renal failure causing metabolic encephalopathy. A workup was negative for acute stroke.     1.Slurred speech - in a patient with dense left MCA stroke with right-sided hemiparesis and some baseline dysarthria - no new  stroke per CT and MRI brain, echo stable, she had metabolic encephalopathy secondary to  dehydration caused by GI illness with acute renal failure unmasking her old symptoms. She was seen by PT OT and speech, currently close to baseline and or dysphagia 3 diet, provide her with home speech therapy upon discharge.     2. Acute renal failure. Caused by dehydration from gastroenteritis. Resolved now with IV fluids which will be continued, renal ultrasound stable. Avoid nephrotoxins. Monitor BMP closely in the outpatient setting.     3. Hypertension. Blood pressure was on the higher side, blood pressure medications have been adjusted have increased her Catapres dose, her MRI does show changes consistent with chronically uncontrolled hypertension. We'll request PCP to kindly monitor her blood pressure and adjust medications as needed.    4. Dyslipidemia. LDL greater than 100 she is on maximum dose Zocor, will add niacin.    Lab Results   Component  Value  Date    CHOL  210*  06/09/2013    HDL  58  06/09/2013    LDLCALC  131*  06/09/2013    TRIG  104  06/09/2013    CHOLHDL  3.6  06/09/2013       5. History of seizure order. Dilantin therapeutic continue at home dose.    6. History of depression. On Cymbalta and Neurontin during once cleared for by mouth and renal function back to baseline.      Today   Subjective:   Ann Lynch today has no headache,no chest abdominal pain,no new weakness tingling or numbness, feels much better wants to go home today.    Objective:   Blood pressure 155/95, pulse 87, temperature 98.3 F (36.8 C), temperature source Oral, resp. rate 18, last menstrual period 04/29/2013, SpO2 100.00%.  No intake or output data in the 24 hours ending 06/10/13 1040  Exam Awake Alert, Oriented x 3, chronic right-sided hemiparesis and facial droop, No new F.N deficits, Normal affect [place].AT,PERRAL Supple Neck,No JVD, No cervical lymphadenopathy appriciated.  Symmetrical Chest wall movement, Good air movement bilaterally, CTAB RRR,No Gallops,Rubs  or new Murmurs, No Parasternal Heave +ve B.Sounds, Abd Soft, Non tender, No organomegaly appriciated, No rebound -guarding or rigidity. No Cyanosis, Clubbing or edema, No new Rash or bruise  Data Review   CBC w Diff:  Lab Results  Component Value Date   WBC 6.1 06/09/2013   HGB 13.4 06/09/2013   HCT 40.7 06/09/2013   PLT 197 06/09/2013   LYMPHOPCT 31 06/08/2013   MONOPCT 9 06/08/2013   EOSPCT 1 06/08/2013   BASOPCT 0 06/08/2013    CMP:  Lab Results  Component Value Date   NA 138 06/10/2013   K 3.9 06/10/2013   CL 101 06/10/2013   CO2 23 06/10/2013   BUN 13 06/10/2013   CREATININE 0.76 06/10/2013   PROT 8.0 06/09/2013   ALBUMIN 3.5 06/09/2013   BILITOT 0.3 06/09/2013   ALKPHOS 104 06/09/2013   AST 21 06/09/2013   ALT 17 06/09/2013  .   Total Time in preparing paper work, data evaluation and todays exam - 35 minutes  Thurnell Lose M.D on 06/10/2013 at 10:40 AM  [facility] Office  [phone]   **Disclaimer: This note may have been dictated with voice recognition software. Similar sounding words can inadvertently be transcribed and this note may contain transcription errors which may not have been corrected upon publication of note.**

## 2013-06-10 NOTE — Progress Notes (Signed)
Speech Language Pathology Treatment: Dysphagia  Patient Details Name: Ann Lynch MRN: [id number] DOB: 05-29-1960 Today's Date: 06/10/2013 Time: 1345-1400 SLP Time Calculation (min): 15 min  Assessment / Plan / Recommendation Clinical Impression  F/u from initial BSE for diet tolerance of dysphagia 3 (mechanical soft) and thin liquids and to determine readiness to advance to regular consistency.  Observed directly with regular solids and thin liquids.   Set-up required for patient to self administer PO's.  Mastication prolonged but effective with no outward clinical s/s of aspiration.   Continues with decreased safety awareness requiring moderate verbal cues to stop talking during oral prep stage.  Patient to discharge home this date.  Recommend to upgrade to regular consistency as tolerated with continued full supervision with all PO's secondary to cognitive deficits.  No further ST indicated at this time.  ST to sign off as education complete.     HPI HPI: 53 year-old female with history of CVA with resulting right-sided hemiparesis, hypertension admitted to  ED with slurred speech, worsening weakness.  CT:  1. No acute intracranial hemorrhage nor evidence of an acute infarction.  MRI: No evidence for acute infarction, hemorrhage, mass lesion, hydrocephalus, or extra-axial fluid. Remote left thalamic infarct        SLP Plan  Discharge SLP treatment due to (comment) (met all goals.  Discharged this date )    Recommendations Diet recommendations: Regular Liquids provided via: Cup;Straw Medication Administration: Whole meds with liquid Supervision: Patient able to self feed;Full supervision/cueing for compensatory strategies Compensations: Slow rate;Small sips/bites Postural Changes and/or Swallow Maneuvers: Seated upright 90 degrees;Upright 30-60 min after meal              Oral Care Recommendations: Oral care BID Follow up Recommendations: None Plan: Discharge SLP treatment due  to (comment) (met all goals.  Discharged this date )    Wister Gearhart, [place] Marcille Buffy 06/10/2013, 2:05 PM

## 2013-06-10 NOTE — Progress Notes (Signed)
Bilateral carotid artery duplex:  1-39% ICA stenosis.  Vertebral artery flow is antegrade.     

## 2013-06-11 NOTE — ED Provider Notes (Signed)
Medical screening examination/treatment/procedure(s) were conducted as a shared visit with non-physician practitioner(s) and myself.  I personally evaluated the patient during the encounter.   EKG Interpretation   Date/Time:  Friday Jun 08 2013 10:20:30 EDT Ventricular Rate:  81 PR Interval:  196 QRS Duration: 81 QT Interval:  420 QTC Calculation: 487 R Axis:   57 Text Interpretation:  Sinus rhythm Anterior infarct, old Nonspecific T  abnormalities, lateral leads NO prior for comparison Confirmed by Dina Rich   MD, Loma Sousa ([id number]) on 06/08/2013 11:39:15 AM      See separate note for details  Merryl Hacker, MD 06/11/13 2000

## 2013-06-11 NOTE — Progress Notes (Signed)
UR complete.  Lewis Grivas RN, MSN 

## 2016-04-30 DIAGNOSIS — R479 Unspecified speech disturbances: Secondary | ICD-10-CM | POA: Diagnosis not present

## 2016-04-30 DIAGNOSIS — I119 Hypertensive heart disease without heart failure: Secondary | ICD-10-CM | POA: Diagnosis not present

## 2016-04-30 DIAGNOSIS — E785 Hyperlipidemia, unspecified: Secondary | ICD-10-CM | POA: Diagnosis not present

## 2016-04-30 DIAGNOSIS — G40909 Epilepsy, unspecified, not intractable, without status epilepticus: Secondary | ICD-10-CM | POA: Diagnosis not present

## 2016-04-30 DIAGNOSIS — I69318 Other symptoms and signs involving cognitive functions following cerebral infarction: Secondary | ICD-10-CM | POA: Diagnosis not present

## 2016-04-30 DIAGNOSIS — Z79899 Other long term (current) drug therapy: Secondary | ICD-10-CM | POA: Diagnosis not present

## 2016-04-30 DIAGNOSIS — Z7982 Long term (current) use of aspirin: Secondary | ICD-10-CM | POA: Diagnosis not present

## 2016-04-30 DIAGNOSIS — R69 Illness, unspecified: Secondary | ICD-10-CM | POA: Diagnosis not present

## 2016-04-30 DIAGNOSIS — I69351 Hemiplegia and hemiparesis following cerebral infarction affecting right dominant side: Secondary | ICD-10-CM | POA: Diagnosis not present

## 2016-04-30 DIAGNOSIS — Z0000 Encounter for general adult medical examination without abnormal findings: Secondary | ICD-10-CM | POA: Diagnosis not present

## 2016-05-18 DIAGNOSIS — R69 Illness, unspecified: Secondary | ICD-10-CM | POA: Diagnosis not present

## 2016-06-11 ENCOUNTER — Emergency Department (HOSPITAL_COMMUNITY): Payer: Medicare HMO

## 2016-06-11 ENCOUNTER — Encounter (HOSPITAL_COMMUNITY): Payer: Self-pay

## 2016-06-11 ENCOUNTER — Inpatient Hospital Stay (HOSPITAL_COMMUNITY)
Admission: EM | Admit: 2016-06-11 | Discharge: 2016-06-17 | DRG: 872 | Disposition: A | Discharge status: Rehabilitation Facility | Payer: Medicare HMO | Attending: Internal Medicine | Admitting: Internal Medicine

## 2016-06-11 DIAGNOSIS — S8992XA Unspecified injury of left lower leg, initial encounter: Secondary | ICD-10-CM | POA: Diagnosis not present

## 2016-06-11 DIAGNOSIS — M25559 Pain in unspecified hip: Secondary | ICD-10-CM | POA: Diagnosis not present

## 2016-06-11 DIAGNOSIS — M79605 Pain in left leg: Secondary | ICD-10-CM

## 2016-06-11 DIAGNOSIS — K5900 Constipation, unspecified: Secondary | ICD-10-CM | POA: Diagnosis present

## 2016-06-11 DIAGNOSIS — G894 Chronic pain syndrome: Secondary | ICD-10-CM | POA: Diagnosis not present

## 2016-06-11 DIAGNOSIS — I69959 Hemiplegia and hemiparesis following unspecified cerebrovascular disease affecting unspecified side: Secondary | ICD-10-CM | POA: Diagnosis not present

## 2016-06-11 DIAGNOSIS — I82433 Acute embolism and thrombosis of popliteal vein, bilateral: Secondary | ICD-10-CM | POA: Diagnosis present

## 2016-06-11 DIAGNOSIS — S79912A Unspecified injury of left hip, initial encounter: Secondary | ICD-10-CM | POA: Diagnosis not present

## 2016-06-11 DIAGNOSIS — M79632 Pain in left forearm: Secondary | ICD-10-CM | POA: Diagnosis not present

## 2016-06-11 DIAGNOSIS — R5381 Other malaise: Secondary | ICD-10-CM | POA: Diagnosis not present

## 2016-06-11 DIAGNOSIS — R059 Cough, unspecified: Secondary | ICD-10-CM

## 2016-06-11 DIAGNOSIS — E860 Dehydration: Secondary | ICD-10-CM | POA: Diagnosis present

## 2016-06-11 DIAGNOSIS — R4182 Altered mental status, unspecified: Secondary | ICD-10-CM | POA: Diagnosis not present

## 2016-06-11 DIAGNOSIS — I10 Essential (primary) hypertension: Secondary | ICD-10-CM | POA: Diagnosis present

## 2016-06-11 DIAGNOSIS — Z7982 Long term (current) use of aspirin: Secondary | ICD-10-CM

## 2016-06-11 DIAGNOSIS — R9389 Abnormal findings on diagnostic imaging of other specified body structures: Secondary | ICD-10-CM

## 2016-06-11 DIAGNOSIS — I639 Cerebral infarction, unspecified: Secondary | ICD-10-CM | POA: Diagnosis not present

## 2016-06-11 DIAGNOSIS — A419 Sepsis, unspecified organism: Principal | ICD-10-CM | POA: Diagnosis present

## 2016-06-11 DIAGNOSIS — G40909 Epilepsy, unspecified, not intractable, without status epilepticus: Secondary | ICD-10-CM | POA: Diagnosis present

## 2016-06-11 DIAGNOSIS — I82413 Acute embolism and thrombosis of femoral vein, bilateral: Secondary | ICD-10-CM | POA: Diagnosis present

## 2016-06-11 DIAGNOSIS — L0390 Cellulitis, unspecified: Secondary | ICD-10-CM | POA: Diagnosis not present

## 2016-06-11 DIAGNOSIS — W050XXA Fall from non-moving wheelchair, initial encounter: Secondary | ICD-10-CM | POA: Diagnosis present

## 2016-06-11 DIAGNOSIS — E871 Hypo-osmolality and hyponatremia: Secondary | ICD-10-CM | POA: Diagnosis present

## 2016-06-11 DIAGNOSIS — Z993 Dependence on wheelchair: Secondary | ICD-10-CM

## 2016-06-11 DIAGNOSIS — R569 Unspecified convulsions: Secondary | ICD-10-CM | POA: Diagnosis not present

## 2016-06-11 DIAGNOSIS — N179 Acute kidney failure, unspecified: Secondary | ICD-10-CM | POA: Diagnosis present

## 2016-06-11 DIAGNOSIS — I69351 Hemiplegia and hemiparesis following cerebral infarction affecting right dominant side: Secondary | ICD-10-CM | POA: Diagnosis not present

## 2016-06-11 DIAGNOSIS — R509 Fever, unspecified: Secondary | ICD-10-CM

## 2016-06-11 DIAGNOSIS — D649 Anemia, unspecified: Secondary | ICD-10-CM | POA: Diagnosis present

## 2016-06-11 DIAGNOSIS — R109 Unspecified abdominal pain: Secondary | ICD-10-CM | POA: Diagnosis not present

## 2016-06-11 DIAGNOSIS — T502X5A Adverse effect of carbonic-anhydrase inhibitors, benzothiadiazides and other diuretics, initial encounter: Secondary | ICD-10-CM | POA: Diagnosis present

## 2016-06-11 DIAGNOSIS — R05 Cough: Secondary | ICD-10-CM

## 2016-06-11 DIAGNOSIS — Z79899 Other long term (current) drug therapy: Secondary | ICD-10-CM | POA: Diagnosis not present

## 2016-06-11 DIAGNOSIS — D62 Acute posthemorrhagic anemia: Secondary | ICD-10-CM | POA: Diagnosis not present

## 2016-06-11 DIAGNOSIS — M25562 Pain in left knee: Secondary | ICD-10-CM | POA: Diagnosis not present

## 2016-06-11 DIAGNOSIS — S37009A Unspecified injury of unspecified kidney, initial encounter: Secondary | ICD-10-CM | POA: Diagnosis not present

## 2016-06-11 DIAGNOSIS — R69 Illness, unspecified: Secondary | ICD-10-CM | POA: Diagnosis not present

## 2016-06-11 DIAGNOSIS — I82423 Acute embolism and thrombosis of iliac vein, bilateral: Secondary | ICD-10-CM | POA: Diagnosis present

## 2016-06-11 DIAGNOSIS — I82409 Acute embolism and thrombosis of unspecified deep veins of unspecified lower extremity: Secondary | ICD-10-CM | POA: Diagnosis not present

## 2016-06-11 DIAGNOSIS — R791 Abnormal coagulation profile: Secondary | ICD-10-CM | POA: Diagnosis not present

## 2016-06-11 DIAGNOSIS — I82403 Acute embolism and thrombosis of unspecified deep veins of lower extremity, bilateral: Secondary | ICD-10-CM | POA: Diagnosis not present

## 2016-06-11 DIAGNOSIS — R600 Localized edema: Secondary | ICD-10-CM | POA: Diagnosis not present

## 2016-06-11 DIAGNOSIS — I69359 Hemiplegia and hemiparesis following cerebral infarction affecting unspecified side: Secondary | ICD-10-CM

## 2016-06-11 DIAGNOSIS — R102 Pelvic and perineal pain: Secondary | ICD-10-CM | POA: Diagnosis not present

## 2016-06-11 DIAGNOSIS — D72829 Elevated white blood cell count, unspecified: Secondary | ICD-10-CM | POA: Diagnosis not present

## 2016-06-11 DIAGNOSIS — S3993XA Unspecified injury of pelvis, initial encounter: Secondary | ICD-10-CM | POA: Diagnosis not present

## 2016-06-11 DIAGNOSIS — E785 Hyperlipidemia, unspecified: Secondary | ICD-10-CM | POA: Diagnosis not present

## 2016-06-11 LAB — DIFFERENTIAL
Basophils Absolute: 0 10*3/uL (ref 0.0–0.1)
Basophils Relative: 0 %
Eosinophils Absolute: 0.1 10*3/uL (ref 0.0–0.7)
Eosinophils Relative: 0 %
Lymphocytes Relative: 9 %
Lymphs Abs: 1.1 10*3/uL (ref 0.7–4.0)
MONOS PCT: 9 %
Monocytes Absolute: 1.2 10*3/uL — ABNORMAL HIGH (ref 0.1–1.0)
NEUTROS ABS: 10.8 10*3/uL — AB (ref 1.7–7.7)
Neutrophils Relative %: 82 %

## 2016-06-11 LAB — COMPREHENSIVE METABOLIC PANEL
ALBUMIN: 2.8 g/dL — AB (ref 3.5–5.0)
ALT: 51 U/L (ref 14–54)
AST: 114 U/L — ABNORMAL HIGH (ref 15–41)
Alkaline Phosphatase: 111 U/L (ref 38–126)
Anion gap: 14 (ref 5–15)
BUN: 63 mg/dL — ABNORMAL HIGH (ref 6–20)
CHLORIDE: 94 mmol/L — AB (ref 101–111)
CO2: 23 mmol/L (ref 22–32)
CREATININE: 5.22 mg/dL — AB (ref 0.44–1.00)
Calcium: 8.9 mg/dL (ref 8.9–10.3)
GFR calc Af Amer: 10 mL/min — ABNORMAL LOW (ref >60)
GFR, EST NON AFRICAN AMERICAN: 9 mL/min — AB (ref >60)
GLUCOSE: 131 mg/dL — AB (ref 65–99)
Potassium: 3.8 mmol/L (ref 3.5–5.1)
SODIUM: 131 mmol/L — AB (ref 135–145)
Total Bilirubin: 1.3 mg/dL — ABNORMAL HIGH (ref 0.3–1.2)
Total Protein: 8.3 g/dL — ABNORMAL HIGH (ref 6.5–8.1)

## 2016-06-11 LAB — URINALYSIS, ROUTINE W REFLEX MICROSCOPIC
Bilirubin Urine: NEGATIVE
Glucose, UA: NEGATIVE mg/dL
KETONES UR: NEGATIVE mg/dL
Nitrite: NEGATIVE
Protein, ur: 30 mg/dL — AB
Specific Gravity, Urine: 1.009 (ref 1.005–1.030)
pH: 5 (ref 5.0–8.0)

## 2016-06-11 LAB — I-STAT CG4 LACTIC ACID, ED
LACTIC ACID, VENOUS: 1.12 mmol/L (ref 0.5–1.9)
LACTIC ACID, VENOUS: 1.88 mmol/L (ref 0.5–1.9)
LACTIC ACID, VENOUS: 0.67 mmol/L (ref 0.5–1.9)

## 2016-06-11 LAB — CBC
HEMATOCRIT: 26.8 % — AB (ref 36.0–46.0)
Hemoglobin: 8.7 g/dL — ABNORMAL LOW (ref 12.0–15.0)
MCH: 29.5 pg (ref 26.0–34.0)
MCHC: 32.5 g/dL (ref 30.0–36.0)
MCV: 90.8 fL (ref 78.0–100.0)
Platelets: 171 10*3/uL (ref 150–400)
RBC: 2.95 MIL/uL — ABNORMAL LOW (ref 3.87–5.11)
RDW: 13.5 % (ref 11.5–15.5)
WBC: 13.2 10*3/uL — AB (ref 4.0–10.5)

## 2016-06-11 LAB — I-STAT TROPONIN, ED
Troponin i, poc: 0.02 ng/mL (ref 0.00–0.08)
Troponin i, poc: 0.01 ng/mL (ref 0.00–0.08)

## 2016-06-11 MED ORDER — ENOXAPARIN SODIUM 30 MG/0.3ML ~~LOC~~ SOLN: 30.0000 mg | Freq: Every day | SUBCUTANEOUS | Status: DC

## 2016-06-11 MED ORDER — SODIUM CHLORIDE 0.9 % IV SOLN
INTRAVENOUS | Status: DC
  Administered 2016-06-12 (×2): via INTRAVENOUS

## 2016-06-11 MED ORDER — SODIUM CHLORIDE 0.9 % IV BOLUS (SEPSIS)
1000.0000 mL | Freq: Once | INTRAVENOUS | Status: AC
  Administered 2016-06-11: 1000 mL via INTRAVENOUS

## 2016-06-11 MED ORDER — ONDANSETRON HCL 4 MG PO TABS: 4.0000 mg | ORAL_TABLET | Freq: Four times a day (QID) | ORAL | Status: DC | PRN

## 2016-06-11 MED ORDER — SODIUM CHLORIDE 0.9 % IV BOLUS (SEPSIS)
250.0000 mL | Freq: Once | INTRAVENOUS | Status: AC
  Administered 2016-06-11: 250 mL via INTRAVENOUS

## 2016-06-11 MED ORDER — ONDANSETRON HCL 4 MG/2ML IJ SOLN: 4.0000 mg | Freq: Four times a day (QID) | INTRAMUSCULAR | Status: DC | PRN

## 2016-06-11 MED ORDER — PIPERACILLIN-TAZOBACTAM 3.375 G IVPB 30 MIN
3.3750 g | Freq: Once | INTRAVENOUS | Status: AC
  Administered 2016-06-11: 3.375 g via INTRAVENOUS
  Filled 2016-06-11: qty 50

## 2016-06-11 MED ORDER — VANCOMYCIN HCL IN DEXTROSE 1-5 GM/200ML-% IV SOLN
1000.0000 mg | Freq: Once | INTRAVENOUS | Status: AC
  Administered 2016-06-12: 1000 mg via INTRAVENOUS
  Filled 2016-06-11: qty 200

## 2016-06-11 NOTE — ED Notes (Signed)
Patient transported to CT 

## 2016-06-11 NOTE — Progress Notes (Signed)
While getting admission information pt reported "If I pee on myself I get punished and can't watch tv in my room." Social work consult ordered. Pt otherwise denies any abuse and states she feel safe in her home. No visual signs of abuse noticed. Will continue to monitor. Isac Caddy, RN

## 2016-06-11 NOTE — ED Notes (Addendum)
Dr Darl Householder at bedside to do ultrasound guided IV.

## 2016-06-11 NOTE — ED Notes (Signed)
Attempted report 

## 2016-06-11 NOTE — ED Notes (Signed)
Phlebotomy at bedside, unable to get blood will continue to try, edp notified

## 2016-06-11 NOTE — ED Notes (Addendum)
Unable to gain second IV site, Darl Householder, MD aware & verbalizes that he will start [place] IV as soon as possible

## 2016-06-11 NOTE — ED Notes (Signed)
Pharmacy contacted re: infiltrated Zosyn to L forearm, 21 mL left to infuse, per pharmacy warm compresses to be placed on L arm, Compresses applied

## 2016-06-11 NOTE — ED Provider Notes (Signed)
[facility] DEPT Provider Note   CSN: [id number] Arrival date & time: 06/11/16  1642     History   Chief Complaint Chief Complaint  Patient presents with  . Fall  . Altered Mental Status    HPI LESLYN MONDA is a 56 y.o. female.  Level V caveat for the patient's current mental status. Per report by family members, over the last 24 hours the patient has become progressively more altered from baseline. Of note, the patient did have a fall out of her wheelchair while trying to transition into the bed a couple days ago. Did not hit her head, did not lose consciousness. Has not had any fevers or chills at home. She is not had any other infectious symptoms, only complaint has been pain in her left lower extremity.   The history is provided by the patient.  Illness  This is a new problem. The current episode started yesterday. The problem occurs constantly. The problem has been gradually worsening. She has tried nothing for the symptoms.    Past Medical History:  Diagnosis Date  . Hypertension   . Stroke [facility])     Patient Active Problem List   Diagnosis Date Noted  . Sepsis due to cellulitis ([place]) 06/11/2016  . Aphasia 06/08/2013  . Slurred speech 06/08/2013  . AKI (acute kidney injury) ([place]) 06/08/2013  . CVA, old, hemiparesis ([place]) 06/08/2013  . HTN (hypertension) 06/08/2013    Past Surgical History:  Procedure Laterality Date  . TRACHEOSTOMY      OB History    No data available       Home Medications    Prior to Admission medications   Medication Sig Start Date End Date Taking? Authorizing Provider  amitriptyline (ELAVIL) 25 MG tablet Take 25 mg by mouth at bedtime.   Yes [provider]  aspirin EC 81 MG tablet Take 81 mg by mouth daily.   Yes [provider]  atorvastatin (LIPITOR) 40 MG tablet Take 40 mg by mouth at bedtime.   Yes [provider]  cloNIDine (CATAPRES) 0.3 MG tablet Take 1 tablet (0.3 mg total) by mouth  4 (four) times daily. Patient taking differently: Take 0.2 mg by mouth 4 (four) times daily.  06/10/13  Yes Thurnell Lose, MD  DULoxetine (CYMBALTA) 20 MG capsule Take 20 mg by mouth at bedtime.    Yes [provider]  folic acid (FOLVITE) 1 MG tablet Take 1 mg by mouth daily.   Yes [provider]  gabapentin (NEURONTIN) 400 MG capsule Take 400 mg by mouth 3 (three) times daily.   Yes [provider]  metoprolol (LOPRESSOR) 100 MG tablet Take 100 mg by mouth 2 (two) times daily.   Yes [provider]  phenytoin (DILANTIN) 100 MG ER capsule Take 100 mg by mouth 3 (three) times daily.    Yes [provider]  spironolactone (ALDACTONE) 25 MG tablet Take 25 mg by mouth 3 (three) times daily.   Yes [provider]  telmisartan-hydrochlorothiazide (MICARDIS HCT) 80-12.5 MG per tablet Take 1 tablet by mouth daily.   Yes [provider]    Family History History reviewed. No pertinent family history.  Social History Social History  Substance Use Topics  . Smoking status: Never Smoker  . Smokeless tobacco: Never Used  . Alcohol use No     Allergies   Patient has no known allergies.   Review of Systems Review of Systems  Unable to perform ROS: Mental status  change     Physical Exam Updated Vital Signs BP 140/89 (BP Location: Left Arm)   Pulse (!) 104   Temp 98.8 F (37.1 C) (Oral)   Resp 18   SpO2 99%   Physical Exam  Constitutional: She appears well-developed and well-nourished. No distress.  HENT:  Head: Normocephalic and atraumatic.  Mouth/Throat: Mucous membranes are dry.  Eyes: Conjunctivae are normal.  Neck: Neck supple.  Cardiovascular: Regular rhythm.  Tachycardia present.   No murmur heard. Pulmonary/Chest: Effort normal and breath sounds normal. No respiratory distress. She has no wheezes. She has no rhonchi.  Abdominal: Soft. There is no guarding.  Musculoskeletal: She exhibits no edema.    Swelling to the left lower extremity. Mild induration to the medial aspect of the upper leg and extending into the lower leg. Warm. Tender to palpation. Mild erythema. No fluctuance appreciated.  Neurological: She is alert.  Skin: Skin is warm and dry.  Psychiatric: She has a normal mood and affect.  Nursing note and vitals reviewed.    ED Treatments / Results  Labs (all labs ordered are listed, but only abnormal results are displayed) Labs Reviewed  COMPREHENSIVE METABOLIC PANEL - Abnormal; Notable for the following:       Result Value   Sodium 131 (*)    Chloride 94 (*)    Glucose, Bld 131 (*)    BUN 63 (*)    Creatinine, Ser 5.22 (*)    Total Protein 8.3 (*)    Albumin 2.8 (*)    AST 114 (*)    Total Bilirubin 1.3 (*)    GFR calc non Af Amer 9 (*)    GFR calc Af Amer 10 (*)    All other components within normal limits  CBC - Abnormal; Notable for the following:    WBC 13.2 (*)    RBC 2.95 (*)    Hemoglobin 8.7 (*)    HCT 26.8 (*)    All other components within normal limits  URINALYSIS, ROUTINE W REFLEX MICROSCOPIC - Abnormal; Notable for the following:    APPearance HAZY (*)    Hgb urine dipstick SMALL (*)    Protein, ur 30 (*)    Leukocytes, UA SMALL (*)    Bacteria, UA RARE (*)    Squamous Epithelial / LPF 0-5 (*)    All other components within normal limits  DIFFERENTIAL - Abnormal; Notable for the following:    Neutro Abs 10.8 (*)    Monocytes Absolute 1.2 (*)    All other components within normal limits  URINE CULTURE  CULTURE, BLOOD (ROUTINE X 2)  CULTURE, BLOOD (ROUTINE X 2)  CBC WITH DIFFERENTIAL/PLATELET  CBC WITH DIFFERENTIAL/PLATELET  HIV ANTIBODY (ROUTINE TESTING)  COMPREHENSIVE METABOLIC PANEL  CBG MONITORING, ED  I-STAT VENOUS BLOOD GAS, ED  I-STAT TROPOININ, ED  I-STAT CG4 LACTIC ACID, ED  I-STAT CG4 LACTIC ACID, ED  I-STAT TROPOININ, ED  I-STAT CG4 LACTIC ACID, ED    EKG  EKG Interpretation  Date/Time:  Friday Jun 11 2016 17:21:09  EDT Ventricular Rate:  87 PR Interval:    QRS Duration: 85 QT Interval:  395 QTC Calculation: 476 R Axis:   66 Text Interpretation:  Sinus rhythm Probable anterior infarct, old Borderline ST elevation, lateral leads No significant change since last tracing Confirmed by YAO  MD, DAVID ([id number]) on 06/11/2016 5:35:03 PM       Radiology Dg Pelvis 1-2 Views  Result Date: 06/11/2016 CLINICAL DATA:  Recent fall with pelvic  pain, initial encounter EXAM: PELVIS - 1-2 VIEW COMPARISON:  None. FINDINGS: Pelvic ring appears intact. The proximal right femur is within normal limits. Some linear bony densities are noted superimposed over the right hip joint likely related to dystrophic bone formation although the possibility of a pelvic fracture in the posterior aspect of the right acetabulum could not be excluded. CT would be helpful for further evaluation. IMPRESSION: Irregular bony density overlying the right hip joint which is likely chronic although the possibility of an acute acetabular fracture deserves consideration. CT of the pelvis is recommended for further evaluation. Electronically Signed   By: [name] M.D.   On: 06/11/2016 18:30   Dg Knee 1-2 Views Left  Result Date: 06/11/2016 CLINICAL DATA:  Altered mental status and recent fall with left knee pain, initial encounter EXAM: LEFT KNEE - 2 VIEW COMPARISON:  None. FINDINGS: No evidence of fracture, dislocation, or joint effusion. No evidence of arthropathy or other focal bone abnormality. Soft tissues are unremarkable. IMPRESSION: No acute abnormality noted. Electronically Signed   By: [name] M.D.   On: 06/11/2016 18:31   Ct Head Wo Contrast  Result Date: 06/11/2016 CLINICAL DATA:  Altered mental status. EXAM: CT HEAD WITHOUT CONTRAST TECHNIQUE: Contiguous axial images were obtained from the base of the skull through the vertex without intravenous contrast. COMPARISON:  Head CT 06/08/2013 and MRI 06/09/2013 FINDINGS: Brain: A chronic left  thalamic infarct is again seen with associated ex vacuo dilatation of the left lateral ventricle and wallerian degeneration along the corticospinal tract. Confluent gliosis extending superiorly into the centrum semiovale is unchanged. Chronic small vessel ischemic changes in the right cerebral white matter are also similar to the prior studies. No acute infarct, acute intracranial hemorrhage, mass, or extra-axial fluid collection is seen. Vascular: Calcified atherosclerosis at the skullbase, age advanced. No hyperdense vessel. Skull: No fracture or focal osseous lesion. Sinuses/Orbits: Paranasal sinuses and mastoid air cells are clear. Unremarkable orbits. Other: None. IMPRESSION: 1. No evidence of acute intracranial abnormality. 2. Chronic left thalamic infarct and chronic small vessel ischemic disease. Electronically Signed   By: Logan Bores M.D.   On: 06/11/2016 20:56   Dg Chest Port 1 View  Result Date: 06/11/2016 CLINICAL DATA:  Altered mental status EXAM: PORTABLE CHEST 1 VIEW COMPARISON:  06/08/2013 FINDINGS: Cardiac shadow is mildly enlarged. The lungs are well aerated bilaterally. No focal infiltrate or sizable effusion is seen. Previously seen masslike densities are less well appreciated on the current exam. IMPRESSION: No acute abnormality seen. Electronically Signed   By: [name] M.D.   On: 06/11/2016 18:28   Dg Femur Min 2 Views Left  Result Date: 06/11/2016 CLINICAL DATA:  Left leg pain following recent fall EXAM: LEFT FEMUR 2 VIEWS COMPARISON:  None. FINDINGS: Degenerative changes of left hip joint are seen. No acute fracture or dislocation is noted. Vascular calcifications are seen. No soft tissue abnormality is noted. IMPRESSION: Degenerative change without acute abnormality. Electronically Signed   By: [name] M.D.   On: 06/11/2016 18:29    Procedures Procedures (including critical care time)  Medications Ordered in ED Medications  enoxaparin (LOVENOX) injection 30 mg  (not administered)  0.9 %  sodium chloride infusion (not administered)  ondansetron (ZOFRAN) tablet 4 mg (not administered)    Or  ondansetron (ZOFRAN) injection 4 mg (not administered)  vancomycin (VANCOCIN) IVPB 1000 mg/200 mL premix (not administered)  sodium chloride 0.9 % bolus 1,000 mL (0 mLs Intravenous Stopped 06/11/16 1855)  And  sodium chloride 0.9 % bolus 1,000 mL (0 mLs Intravenous Stopped 06/11/16 1810)    And  sodium chloride 0.9 % bolus 250 mL (0 mLs Intravenous Stopped 06/11/16 2109)  piperacillin-tazobactam (ZOSYN) IVPB 3.375 g (0 g Intravenous Stopped 06/11/16 2034)     Initial Impression / Assessment and Plan / ED Course  I have reviewed the triage vital signs and the nursing notes.  Pertinent labs & imaging results that were available during my care of the patient were reviewed by me and considered in my medical decision making (see chart for details).     On initial arrival here, patient was significantly hypertensive into the systolic 93T. Altered from baseline mental status. Temperature 99.4. Concern for sepsis. Only source identifiable is pain in the left lower extremity, with possible cellulitis. Started the patient on antibiotics and gave her her fall 30 mL/kg bolus. Blood pressure responded appropriately. Urinalysis without evidence of urinary tract infection. Lactate was normal. Creatinine was significantly elevated at 5.2 with an elevated BUN. No prior creatinine to compare to of the last several years. Leukocytosis to 13.2. Hemoglobin 8.7; significant drop from prior blood draw several years ago. Patient and family members deny any hematochezia or melena. X-rays of the patient's left lower extremity obtained given the description of pain in the report of the patient falling a few days ago. There is question of possible acetabular fracture. Nothing clinical significant for right now. Might need inpatient CT. Remainder of lower extremity x-rays normal. Chest x-ray  without evidence of pneumothorax or pneumonia. CT of the head without evidence of intracranial laterality.  Limits the patient to the hospital for concern for severe sepsis.  Final Clinical Impressions(s) / ED Diagnoses   Final diagnoses:  Left leg pain  Left leg pain    New Prescriptions Current Discharge Medication List       Maryan Puls, MD 06/11/16 2328    Drenda Freeze, MD 06/12/16 [phone]

## 2016-06-11 NOTE — ED Triage Notes (Addendum)
Pt brought in by family with c/o altered mental status and left leg pain after she had a fall two days ago. Pt appears drowsy, falls asleep in wheelchair but is easily aroused with verbal stimulation. The lethargy started yesterday. She fell when trying to get into her wheelchair from the bed. No head injury per family. They state she is confused which is not normal for pt.

## 2016-06-11 NOTE — Progress Notes (Signed)
New Admission Note:   Arrival Method: From ED via stetcher Mental Orientation: A&O Telemetry: box [id number] Assessment: Completed Skin: Intact IV: Right Upper Arm Pain: denies any pain Tubes: None Safety Measures: Safety Fall Prevention Plan has been discussed  Admission 2 [facility] Orientation: Patient has been orientated to the room, unit and staff.  Family: none present at bedside  Orders to be reviewed and implemented. Will continue to monitor the patient. Call light has been placed within reach and bed alarm has been activated. Tele Box applied. CCMD notified     Isac Caddy, RN

## 2016-06-11 NOTE — H&P (Addendum)
History and Physical    Ann Lynch GUR:[id number] DOB: [birth year] DOA: 06/11/2016  PCP: [name], MD   Patient coming from: Home.  I have personally briefly reviewed patient's old medical records in [place]  Chief Complaint: Left leg pain.  HPI: Ann Lynch is a 56 y.o. female with medical history significant of hypertension, CVA with right-sided hemiplegia who was brought to the emergency department with left lower extremity pain after having a fall 2 days ago, followed by drowsiness since yesterday.  Per patient, she was trying to get from her wheelchair to her bed 2 days ago. She subsequently fell, injuring her left lower extremity. She denies head trauma or LOC. Since then, the patient has been having pain associated with erythema, calor and edema of her left thigh extending into the pretibial area. Her family noticed that she was more drowsy and somnolent but usual and brought her to the emergency department. She denies fever, chills, but feels fatigue. She denies dyspnea, chest pain, palpitations, dizziness, diaphoresis, nausea, emesis, abdominal pain, diarrhea, melena, hematochezia, dysuria or frequency. She complains of frequent constipation.  ED Course: She received 2500 mL normal saline bolus, was started on Zosyn and vancomycin. Urinalysis WBC 6-30 with small hemoglobin and proteinuria. WBC 13.2, hemoglobin 8.7 and platelets 171. Lactic acid 2 was normal. Sodium 131, potassium 3.8, chloride 94 and bicarbonate 23 mmol/L. BUN was 63, creatinine 5.22 and glucose 131. Renal function was normal in 2015.   Imaging: Chest radiograph, Femur and knee x-rays did not show acute abnormalities.  CT scan of brain did not show any acute intracranial pathology. However, pelvis x-ray showed irregular bony density overlying the right hip joint which is likely chronic, but CT of the pelvis was recommended to rule out acute acetabular fracture.  Review of Systems: As per  HPI otherwise 10 point review of systems negative.    Past Medical History:  Diagnosis Date  . Hypertension   . Stroke [facility])     Past Surgical History:  Procedure Laterality Date  . TRACHEOSTOMY       reports that she has never smoked. She has never used smokeless tobacco. She reports that she does not drink alcohol. Her drug history is not on file.  No Known Allergies  History reviewed. No pertinent family history.   Prior to Admission medications   Medication Sig Start Date End Date Taking? Authorizing Provider  amitriptyline (ELAVIL) 25 MG tablet Take 25 mg by mouth at bedtime.   Yes [provider]  aspirin EC 81 MG tablet Take 81 mg by mouth daily.   Yes [provider]  atorvastatin (LIPITOR) 40 MG tablet Take 40 mg by mouth at bedtime.   Yes [provider]  cloNIDine (CATAPRES) 0.3 MG tablet Take 1 tablet (0.3 mg total) by mouth 4 (four) times daily. Patient taking differently: Take 0.2 mg by mouth 4 (four) times daily.  06/10/13  Yes Thurnell Lose, MD  DULoxetine (CYMBALTA) 20 MG capsule Take 20 mg by mouth at bedtime.    Yes [provider]  folic acid (FOLVITE) 1 MG tablet Take 1 mg by mouth daily.   Yes [provider]  gabapentin (NEURONTIN) 400 MG capsule Take 400 mg by mouth 3 (three) times daily.   Yes [provider]  metoprolol (LOPRESSOR) 100 MG tablet Take 100 mg by mouth 2 (two) times daily.   Yes [provider]  phenytoin (DILANTIN) 100 MG ER capsule Take 100 mg by  mouth 3 (three) times daily.    Yes [provider]  spironolactone (ALDACTONE) 25 MG tablet Take 25 mg by mouth 3 (three) times daily.   Yes [provider]  telmisartan-hydrochlorothiazide (MICARDIS HCT) 80-12.5 MG per tablet Take 1 tablet by mouth daily.   Yes [provider]    Physical Exam: Vitals:   06/11/16 2120 06/11/16 2130 06/11/16 2200 06/11/16 2210  BP: (!) 124/100 (!) 128/106 130/85  128/82  Pulse: 94 93 99 99  Resp: (!) 23 (!) 23 19 20   Temp:      TempSrc:      SpO2: 99% 94% 96% 100%   Constitutional: NAD, calm, comfortable Eyes: PERRL, lids and conjunctivae normal ENMT: Mucous membranes are dry. Posterior pharynx clear of any exudate or lesions. Neck: normal, supple, no masses, no thyromegaly Respiratory: clear to auscultation bilaterally, no wheezing, no crackles. Normal respiratory effort. No accessory muscle use.  Cardiovascular: Regular rate and rhythm, no murmurs / rubs / gallops. No extremity edema. 2+ pedal pulses. No carotid bruits.  Abdomen: Soft, no tenderness, no masses palpated. No hepatosplenomegaly. Bowel sounds positive.  Musculoskeletal: no clubbing / cyanosis. Decreased ROM of right sided extremities, no contractures. Atrophy and decreased muscle tone of right-sided extremities.  Skin: Positive erythema, edema, calor and tenderness of left lower extremity at the medial thigh extending further to the lower parts of the extremity. Neurologic: CN 2-12 grossly intact. Sensation intact, Right hemiplegia.  Psychiatric:  Alert and oriented x 4. Irritable mood due to pain.    Labs on Admission: I have personally reviewed following labs and imaging studies  CBC:  Recent Labs Lab 06/11/16 1703  WBC 13.2*  HGB 8.7*  HCT 26.8*  MCV 90.8  PLT 782   Basic Metabolic Panel:  Recent Labs Lab 06/11/16 1703  NA 131*  K 3.8  CL 94*  CO2 23  GLUCOSE 131*  BUN 63*  CREATININE 5.22*  CALCIUM 8.9   GFR: CrCl cannot be calculated (Unknown ideal weight.). Liver Function Tests:  Recent Labs Lab 06/11/16 1703  AST 114*  ALT 51  ALKPHOS 111  BILITOT 1.3*  PROT 8.3*  ALBUMIN 2.8*   No results for input(s): LIPASE, AMYLASE in the last 168 hours. No results for input(s): AMMONIA in the last 168 hours. Coagulation Profile: No results for input(s): INR, PROTIME in the last 168 hours. Cardiac Enzymes: No results for input(s): CKTOTAL, CKMB,  CKMBINDEX, TROPONINI in the last 168 hours. BNP (last 3 results) No results for input(s): PROBNP in the last 8760 hours. HbA1C: No results for input(s): HGBA1C in the last 72 hours. CBG: No results for input(s): GLUCAP in the last 168 hours. Lipid Profile: No results for input(s): CHOL, HDL, LDLCALC, TRIG, CHOLHDL, LDLDIRECT in the last 72 hours. Thyroid Function Tests: No results for input(s): TSH, T4TOTAL, FREET4, T3FREE, THYROIDAB in the last 72 hours. Anemia Panel: No results for input(s): VITAMINB12, FOLATE, FERRITIN, TIBC, IRON, RETICCTPCT in the last 72 hours. Urine analysis:    Component Value Date/Time   COLORURINE YELLOW 06/11/2016 2107   APPEARANCEUR HAZY (A) 06/11/2016 2107   LABSPEC 1.009 06/11/2016 2107   PHURINE 5.0 06/11/2016 2107   GLUCOSEU NEGATIVE 06/11/2016 2107   HGBUR SMALL (A) 06/11/2016 2107   BILIRUBINUR NEGATIVE 06/11/2016 2107   [place] 06/11/2016 2107   PROTEINUR 30 (A) 06/11/2016 2107   UROBILINOGEN 0.2 06/08/2013 2308   NITRITE NEGATIVE 06/11/2016 2107   LEUKOCYTESUR SMALL (A) 06/11/2016 2107    Radiological Exams on  Admission: Dg Pelvis 1-2 Views  Result Date: 06/11/2016 CLINICAL DATA:  Recent fall with pelvic pain, initial encounter EXAM: PELVIS - 1-2 VIEW COMPARISON:  None. FINDINGS: Pelvic ring appears intact. The proximal right femur is within normal limits. Some linear bony densities are noted superimposed over the right hip joint likely related to dystrophic bone formation although the possibility of a pelvic fracture in the posterior aspect of the right acetabulum could not be excluded. CT would be helpful for further evaluation. IMPRESSION: Irregular bony density overlying the right hip joint which is likely chronic although the possibility of an acute acetabular fracture deserves consideration. CT of the pelvis is recommended for further evaluation. Electronically Signed   By: [name] M.D.   On: 06/11/2016 18:30   Dg Knee 1-2  Views Left  Result Date: 06/11/2016 CLINICAL DATA:  Altered mental status and recent fall with left knee pain, initial encounter EXAM: LEFT KNEE - 2 VIEW COMPARISON:  None. FINDINGS: No evidence of fracture, dislocation, or joint effusion. No evidence of arthropathy or other focal bone abnormality. Soft tissues are unremarkable. IMPRESSION: No acute abnormality noted. Electronically Signed   By: [name] M.D.   On: 06/11/2016 18:31   Ct Head Wo Contrast  Result Date: 06/11/2016 CLINICAL DATA:  Altered mental status. EXAM: CT HEAD WITHOUT CONTRAST TECHNIQUE: Contiguous axial images were obtained from the base of the skull through the vertex without intravenous contrast. COMPARISON:  Head CT 06/08/2013 and MRI 06/09/2013 FINDINGS: Brain: A chronic left thalamic infarct is again seen with associated ex vacuo dilatation of the left lateral ventricle and wallerian degeneration along the corticospinal tract. Confluent gliosis extending superiorly into the centrum semiovale is unchanged. Chronic small vessel ischemic changes in the right cerebral white matter are also similar to the prior studies. No acute infarct, acute intracranial hemorrhage, mass, or extra-axial fluid collection is seen. Vascular: Calcified atherosclerosis at the skullbase, age advanced. No hyperdense vessel. Skull: No fracture or focal osseous lesion. Sinuses/Orbits: Paranasal sinuses and mastoid air cells are clear. Unremarkable orbits. Other: None. IMPRESSION: 1. No evidence of acute intracranial abnormality. 2. Chronic left thalamic infarct and chronic small vessel ischemic disease. Electronically Signed   By: Logan Bores M.D.   On: 06/11/2016 20:56   Dg Chest Port 1 View  Result Date: 06/11/2016 CLINICAL DATA:  Altered mental status EXAM: PORTABLE CHEST 1 VIEW COMPARISON:  06/08/2013 FINDINGS: Cardiac shadow is mildly enlarged. The lungs are well aerated bilaterally. No focal infiltrate or sizable effusion is seen. Previously seen  masslike densities are less well appreciated on the current exam. IMPRESSION: No acute abnormality seen. Electronically Signed   By: [name] M.D.   On: 06/11/2016 18:28   Dg Femur Min 2 Views Left  Result Date: 06/11/2016 CLINICAL DATA:  Left leg pain following recent fall EXAM: LEFT FEMUR 2 VIEWS COMPARISON:  None. FINDINGS: Degenerative changes of left hip joint are seen. No acute fracture or dislocation is noted. Vascular calcifications are seen. No soft tissue abnormality is noted. IMPRESSION: Degenerative change without acute abnormality. Electronically Signed   By: [name] M.D.   On: 06/11/2016 18:29  Echocardiogram 06/09/2013  Indications:   TIA 435.9.  ------------------------------------------------------------------- History:  PMH:  Stroke. Risk factors: Hypertension.  ------------------------------------------------------------------- Study Conclusions  - Left ventricle: The cavity size was normal. Wall thickness was increased in a pattern of moderate to severe LVH. Systolic function was vigorous. The estimated ejection fraction was in the range of 65% to 70%. There was  dynamic obstruction, with mid-cavity obliteration, a peak velocity of 2 cm/sec, and a peak gradient of 16 mm Hg. Wall motion was normal; there were no regional wall motion abnormalities. Doppler parameters are consistent with abnormal left ventricular relaxation (grade 1 diastolic dysfunction).  Impressions:  - No cardiac source of emboli was indentified.  EKG: Independently reviewed Vent. rate 87 BPM PR interval * ms QRS duration 85 ms QT/QTc 395/476 ms P-R-T axes 45 66 16 Sinus rhythm Probable anterior infarct, old Borderline ST elevation, lateral leads  Assessment/Plan Principal Problem:   Sepsis due to cellulitis (HCC) Admit to telemetry/inpatient. Continue vancomycin and Zosyn per pharmacy. Continue IV fluids. Follow-up blood cultures and  sensitivity. Follow-up WBC in a.m.  Active Problems:   AKI (acute kidney injury) ([place]) Hold Micardis HCT. Hold the spironolactone. Continue IV hydration. Check renal ultrasound. Follow-up renal function and electrolytes in a.m. Consider nephrology evaluation if no improvement.    CVA, old, hemiparesis ([place]) Supportive care. Continue prophylactic phenytoin 100 mg by mouth 3 times a day. Check phenytoin level.    HTN (hypertension) Continue metoprolol 100 mg by mouth twice a day. Continue clonidine 0.3 mg by mouth 3 times a day Monitor blood pressure.    DVT prophylaxis: Lovenox SQ. Code Status: Full code. Family Communication:  Disposition Plan: Admit for IV antibiotics for 2-3 days. Consults called:  Admission status: Inpatient/telemetry.   Reubin Milan MD [facility] Pager [phone].  If 7PM-7AM, please contact night-coverage [email or web address] Password [facility]  06/11/2016, 10:58 PM

## 2016-06-11 NOTE — ED Notes (Signed)
CBG 110. 

## 2016-06-12 ENCOUNTER — Inpatient Hospital Stay (HOSPITAL_COMMUNITY): Payer: Medicare HMO

## 2016-06-12 ENCOUNTER — Encounter (HOSPITAL_COMMUNITY): Payer: Self-pay | Admitting: Internal Medicine

## 2016-06-12 DIAGNOSIS — N179 Acute kidney failure, unspecified: Secondary | ICD-10-CM

## 2016-06-12 DIAGNOSIS — I69359 Hemiplegia and hemiparesis following cerebral infarction affecting unspecified side: Secondary | ICD-10-CM

## 2016-06-12 DIAGNOSIS — M79609 Pain in unspecified limb: Secondary | ICD-10-CM

## 2016-06-12 LAB — CBC WITH DIFFERENTIAL/PLATELET
BASOS ABS: 0 10*3/uL (ref 0.0–0.1)
Basophils Relative: 0 %
Eosinophils Absolute: 0.1 10*3/uL (ref 0.0–0.7)
Eosinophils Relative: 1 %
HCT: 25.1 % — ABNORMAL LOW (ref 36.0–46.0)
HEMOGLOBIN: 8.4 g/dL — AB (ref 12.0–15.0)
LYMPHS ABS: 0.7 10*3/uL (ref 0.7–4.0)
LYMPHS PCT: 6 %
MCH: 30.2 pg (ref 26.0–34.0)
MCHC: 33.5 g/dL (ref 30.0–36.0)
MCV: 90.3 fL (ref 78.0–100.0)
Monocytes Absolute: 1.4 10*3/uL — ABNORMAL HIGH (ref 0.1–1.0)
Monocytes Relative: 12 %
NEUTROS PCT: 81 %
Neutro Abs: 9.8 10*3/uL — ABNORMAL HIGH (ref 1.7–7.7)
Platelets: 169 10*3/uL (ref 150–400)
RBC: 2.78 MIL/uL — AB (ref 3.87–5.11)
RDW: 13.5 % (ref 11.5–15.5)
WBC: 12 10*3/uL — AB (ref 4.0–10.5)

## 2016-06-12 LAB — COMPREHENSIVE METABOLIC PANEL
ALBUMIN: 2.3 g/dL — AB (ref 3.5–5.0)
ALT: 48 U/L (ref 14–54)
AST: 108 U/L — AB (ref 15–41)
Alkaline Phosphatase: 106 U/L (ref 38–126)
Anion gap: 13 (ref 5–15)
BILIRUBIN TOTAL: 1.5 mg/dL — AB (ref 0.3–1.2)
BUN: 57 mg/dL — AB (ref 6–20)
CHLORIDE: 97 mmol/L — AB (ref 101–111)
CO2: 20 mmol/L — ABNORMAL LOW (ref 22–32)
CREATININE: 4.1 mg/dL — AB (ref 0.44–1.00)
Calcium: 8.2 mg/dL — ABNORMAL LOW (ref 8.9–10.3)
GFR calc Af Amer: 13 mL/min — ABNORMAL LOW (ref >60)
GFR, EST NON AFRICAN AMERICAN: 11 mL/min — AB (ref >60)
GLUCOSE: 165 mg/dL — AB (ref 65–99)
Potassium: 3.2 mmol/L — ABNORMAL LOW (ref 3.5–5.1)
Sodium: 130 mmol/L — ABNORMAL LOW (ref 135–145)
Total Protein: 6.9 g/dL (ref 6.5–8.1)

## 2016-06-12 LAB — TSH: TSH: 0.613 u[IU]/mL (ref 0.350–4.500)

## 2016-06-12 LAB — MAGNESIUM: MAGNESIUM: 2.2 mg/dL (ref 1.7–2.4)

## 2016-06-12 LAB — HIV ANTIBODY (ROUTINE TESTING W REFLEX): HIV SCREEN 4TH GENERATION: NONREACTIVE

## 2016-06-12 LAB — PHOSPHORUS: Phosphorus: 4.2 mg/dL (ref 2.5–4.6)

## 2016-06-12 LAB — CK: Total CK: 238 U/L — ABNORMAL HIGH (ref 38–234)

## 2016-06-12 LAB — PROTIME-INR
INR: 1.11
Prothrombin Time: 14.4 seconds (ref 11.4–15.2)

## 2016-06-12 LAB — HEPARIN LEVEL (UNFRACTIONATED): Heparin Unfractionated: 0.54 IU/mL (ref 0.30–0.70)

## 2016-06-12 MED ORDER — NEPRO/CARBSTEADY PO LIQD
237.0000 mL | Freq: Two times a day (BID) | ORAL | Status: DC
  Administered 2016-06-12 – 2016-06-16 (×4): 237 mL via ORAL
  Filled 2016-06-12: qty 237

## 2016-06-12 MED ORDER — POTASSIUM CHLORIDE CRYS ER 20 MEQ PO TBCR
40.0000 meq | EXTENDED_RELEASE_TABLET | Freq: Two times a day (BID) | ORAL | Status: AC
Start: 2016-06-12 — End: 2016-06-12
  Administered 2016-06-12 (×2): 40 meq via ORAL
  Filled 2016-06-12 (×3): qty 2

## 2016-06-12 MED ORDER — OXYCODONE-ACETAMINOPHEN 5-325 MG PO TABS
1.0000 | ORAL_TABLET | Freq: Four times a day (QID) | ORAL | Status: DC | PRN
  Administered 2016-06-15 – 2016-06-17 (×5): 1 via ORAL
  Filled 2016-06-12 (×5): qty 1

## 2016-06-12 MED ORDER — METOPROLOL TARTRATE 100 MG PO TABS
100.0000 mg | ORAL_TABLET | Freq: Two times a day (BID) | ORAL | Status: DC
  Administered 2016-06-12 – 2016-06-17 (×11): 100 mg via ORAL
  Filled 2016-06-12 (×11): qty 1

## 2016-06-12 MED ORDER — ATORVASTATIN CALCIUM 40 MG PO TABS
40.0000 mg | ORAL_TABLET | Freq: Every day | ORAL | Status: DC
  Administered 2016-06-12 – 2016-06-16 (×5): 40 mg via ORAL
  Filled 2016-06-12 (×6): qty 1

## 2016-06-12 MED ORDER — GABAPENTIN 400 MG PO CAPS
400.0000 mg | ORAL_CAPSULE | Freq: Three times a day (TID) | ORAL | Status: DC
Start: 2016-06-12 — End: 2016-06-17
  Administered 2016-06-12 – 2016-06-17 (×15): 400 mg via ORAL
  Filled 2016-06-12 (×16): qty 1

## 2016-06-12 MED ORDER — PHENYTOIN SODIUM EXTENDED 100 MG PO CAPS
100.0000 mg | ORAL_CAPSULE | Freq: Three times a day (TID) | ORAL | Status: DC
Start: 2016-06-12 — End: 2016-06-17
  Administered 2016-06-12 – 2016-06-17 (×16): 100 mg via ORAL
  Filled 2016-06-12 (×16): qty 1

## 2016-06-12 MED ORDER — POTASSIUM CHLORIDE CRYS ER 20 MEQ PO TBCR: 40.0000 meq | EXTENDED_RELEASE_TABLET | Freq: Once | ORAL | Status: DC

## 2016-06-12 MED ORDER — DULOXETINE HCL 20 MG PO CPEP
20.0000 mg | ORAL_CAPSULE | Freq: Every day | ORAL | Status: DC
  Administered 2016-06-12 – 2016-06-16 (×5): 20 mg via ORAL
  Filled 2016-06-12 (×5): qty 1

## 2016-06-12 MED ORDER — HEPARIN (PORCINE) IN NACL 100-0.45 UNIT/ML-% IJ SOLN
1050.0000 [IU]/h | INTRAMUSCULAR | Status: DC
  Administered 2016-06-12 – 2016-06-13 (×2): 1100 [IU]/h via INTRAVENOUS
  Administered 2016-06-15: 1050 [IU]/h via INTRAVENOUS
  Filled 2016-06-12 (×4): qty 250

## 2016-06-12 MED ORDER — AMITRIPTYLINE HCL 50 MG PO TABS
25.0000 mg | ORAL_TABLET | Freq: Every day | ORAL | Status: DC
  Administered 2016-06-12 – 2016-06-16 (×5): 25 mg via ORAL
  Filled 2016-06-12 (×5): qty 1

## 2016-06-12 MED ORDER — VANCOMYCIN HCL IN DEXTROSE 1-5 GM/200ML-% IV SOLN: 1000.0000 mg | INTRAVENOUS | Status: DC

## 2016-06-12 MED ORDER — CLONIDINE HCL 0.2 MG PO TABS
0.2000 mg | ORAL_TABLET | Freq: Four times a day (QID) | ORAL | Status: DC
  Administered 2016-06-12 – 2016-06-13 (×7): 0.2 mg via ORAL
  Filled 2016-06-12 (×8): qty 1

## 2016-06-12 MED ORDER — HEPARIN BOLUS VIA INFUSION
4000.0000 [IU] | Freq: Once | INTRAVENOUS | Status: AC
  Administered 2016-06-12: 4000 [IU] via INTRAVENOUS
  Filled 2016-06-12: qty 4000

## 2016-06-12 MED ORDER — ASPIRIN EC 81 MG PO TBEC
81.0000 mg | DELAYED_RELEASE_TABLET | Freq: Every day | ORAL | Status: DC
  Administered 2016-06-12 – 2016-06-17 (×6): 81 mg via ORAL
  Filled 2016-06-12 (×6): qty 1

## 2016-06-12 MED ORDER — FOLIC ACID 1 MG PO TABS
1.0000 mg | ORAL_TABLET | Freq: Every day | ORAL | Status: DC
  Administered 2016-06-12 – 2016-06-17 (×6): 1 mg via ORAL
  Filled 2016-06-12 (×6): qty 1

## 2016-06-12 MED ORDER — POTASSIUM CHLORIDE IN NACL 20-0.9 MEQ/L-% IV SOLN
INTRAVENOUS | Status: DC
  Administered 2016-06-12 – 2016-06-13 (×2): via INTRAVENOUS
  Filled 2016-06-12 (×2): qty 1000

## 2016-06-12 MED ORDER — PIPERACILLIN-TAZOBACTAM IN DEX 2-0.25 GM/50ML IV SOLN
2.2500 g | Freq: Three times a day (TID) | INTRAVENOUS | Status: DC
  Administered 2016-06-12 – 2016-06-13 (×4): 2.25 g via INTRAVENOUS
  Filled 2016-06-12 (×5): qty 50

## 2016-06-12 MED ORDER — HEPARIN SODIUM (PORCINE) 5000 UNIT/ML IJ SOLN: 5000.0000 [IU] | Freq: Three times a day (TID) | INTRAMUSCULAR | Status: DC

## 2016-06-12 NOTE — Progress Notes (Signed)
VASCULAR LAB PRELIMINARY  PRELIMINARY  PRELIMINARY  PRELIMINARY  Bilateral lower extremity venous duplex completed.    Preliminary report:  Bilateral exam completed per protocol when DVT is noted in the requested extremity. Left - Positive for acute DVT and age indeterminate DVT in the iliac, common femoral, femoral, and popliteal veins. There is also superficial thromobosis of the saphenofemoral junction and the proximal greater saphenous vein.  Right - Positive for acute DVT and age indeterminate DVT of the iliac, common femoral, femoral, and popliteal veins. There is no evidence of a superficial thrombosis at this time.  Bilateral - No evidence of a Baker's cyst.   Zowie Lundahl, RVS 06/12/2016, 11:18 AM

## 2016-06-12 NOTE — Progress Notes (Addendum)
ANTICOAGULATION CONSULT NOTE - Initial Consult  Pharmacy Consult for heparin Indication: DVT  No Known Allergies  Patient Measurements: Height: 5\' 7"  (170.2 cm) Weight: 170 lb (77.1 kg) IBW/kg (Calculated) : 61.6 Heparin Dosing Weight: 77 kg  Vital Signs: Temp: 98.6 F (37 C) (05/26 0333) Temp Source: Oral (05/26 0333) BP: 128/80 (05/26 0333) Pulse Rate: 110 (05/26 0333)  Labs:  Recent Labs  06/11/16 1703 06/12/16 0632  HGB 8.7* 8.4*  HCT 26.8* 25.1*  PLT 171 169  CREATININE 5.22* 4.10*  CKTOTAL  --  238*     Medical History: Past Medical History:  Diagnosis Date  . Hypertension   . Stroke [facility])      Assessment: 56 yo female admitted with lower extremity pain after falling a couple days ago. She is wheelchair bound at baseline. Dopplers completed this morning were positive for extensive bilateral DVTs in R and L lower extremities. ?drop in hgb, 13.7 > 8.4, plts wnl. Baseline SCr appears to be 1.1 > 2.3, currently up to 4.1, eCrCl < 20. Will need to carefully assess patient's baseline SCr before deciding on oral anticoagulation option.    Goal of Therapy:  Heparin level 0.3-0.7 units/ml Monitor platelets by anticoagulation protocol: Yes    Plan:  -Heparin bolus 4000 units x1 then 1100 units/hr -Daily HL, CBC -Level this evening -Watch trend in hgb and SCr   Mehki Klumpp, Jake Church 06/12/2016,11:23 AM   Addendum -Evening heparin level is therapeutic and WNL -Continue current rate   Harvel Quale  06/12/2016 8:28 PM

## 2016-06-12 NOTE — Progress Notes (Addendum)
[name] PROGRESS NOTE  Ann Lynch YQM:[id number] DOB: 1960-05-12 DOA: 06/11/2016   PCP: Ann Beard, MD     Assessment/Plan: Principal Problem:   Sepsis due to cellulitis [facility]) Active Problems:   AKI (acute kidney injury) (Ann Lynch)   CVA, old, hemiparesis (Ann Lynch)   HTN (hypertension)   56 y.o. female with medical history significant of hypertension, CVA with right-sided hemiplegia who was brought to the emergency department with left lower extremity pain after having a fall 2 days ago, followed by drowsiness since yesterday. Patient presented with altered mental status and left leg pain. Admitted for cellulitis, acute kidney injury,  Assessment and plan Left leg pain,doubt cellulitis or sepsis  Continue telemetry, admission lactic acid within normal limits, check pro-calcitonin   Continue vancomycin and Zosyn per pharmacy. Day #2, dc abx in am if bc negative  Continue IV fluids. Follow-up blood cultures and sensitivity. Leukocytosis   improving      AKI (acute kidney injury) ([name]) presented with a creatinine of 5.22>now 4.10 Hold Micardis HCT. Hyponatremia likely secondary to HCTZ Hold the spironolactone in the setting of dehydration. Continue IV hydration. Check renal ultrasound. Follow-up renal function and electrolytes in a.m. Creatinine improving slowly, baseline unknown but probably within normal range based on 06/10/13 CK slightly elevated, continue to follow    CVA, old, hemiparesis (Ann Lynch) Supportive care. Continue prophylactic phenytoin 100 mg by mouth 3 times a day. Check phenytoin level.    HTN (hypertension) Continue metoprolol 100 mg by mouth twice a day. Continue clonidine 0.3 mg by mouth 3 times a day Monitor blood pressure.   Extensive pelvic and B/L LE DVT's  Left - Positive for acute DVT and age indeterminate DVT in the iliac, common femoral, femoral, and popliteal veins. There is also superficial thromobosis of the  saphenofemoral junction and the proximal greater saphenous vein. Right - Positive for acute DVT and age indeterminate DVT of the iliac, common femoral, femoral, and popliteal veins Started on heparin gtt Will obtain CT chest to r/o occult malignancy,  Vascular surgery consult for further recommendations   DVT prophylaxsis heparin gtt  Code Status:  Full code    Family Communication: Discussed in detail with the patient, all imaging results, lab results explained to the patient   Disposition Plan:  1-2 days     Consultants:   None  Procedures:  None  Antibiotics: Anti-infectives    Start     Dose/Rate Route Frequency Ordered Stop   06/13/16 2100  vancomycin (VANCOCIN) IVPB 1000 mg/200 mL premix     1,000 mg 200 mL/hr over 60 Minutes Intravenous Every 48 hours 06/12/16 0341     06/12/16 0600  piperacillin-tazobactam (ZOSYN) IVPB 2.25 g     2.25 g 100 mL/hr over 30 Minutes Intravenous Every 8 hours 06/12/16 0137     06/12/16 0000  vancomycin (VANCOCIN) IVPB 1000 mg/200 mL premix     1,000 mg 200 mL/hr over 60 Minutes Intravenous  Once 06/11/16 2258 06/12/16 0131   06/11/16 1745  piperacillin-tazobactam (ZOSYN) IVPB 3.375 g     3.375 g 100 mL/hr over 30 Minutes Intravenous  Once 06/11/16 1741 06/11/16 2034         HPI/Subjective: Still complaining of left leg pain , does not remember if she is on a baby ASA at home   Objective: Vitals:   06/11/16 2210 06/11/16 2308 06/12/16 0326 06/12/16 0333  BP: 128/82 140/89  128/80  Pulse: 99 (!) 104  (!)  110  Resp: 20 18  18   Temp:  98.8 F (37.1 C)  98.6 F (37 C)  TempSrc:  Oral  Oral  SpO2: 100% 99%  96%  Weight:   77.1 kg (170 lb)   Height:   5\' 7"  (1.702 m)     Intake/Output Summary (Last 24 hours) at 06/12/16 0855 Last data filed at 06/12/16 0400  Gross per 24 hour  Intake          2893.75 ml  Output                0 ml  Net          2893.75 ml    Exam:  Cardiovascular: Regular rate and rhythm, no  murmurs / rubs / gallops. No extremity edema. 2+ pedal pulses. No carotid bruits.  Abdomen: Soft, no tenderness, no masses palpated. No hepatosplenomegaly. Bowel sounds positive.  Musculoskeletal: no clubbing / cyanosis. Decreased ROM of right sided extremities, no contractures. Atrophy and decreased muscle tone of right-sided extremities.  Skin: Positive erythema, edema, calor and tenderness of left lower extremity at the medial thigh extending further to the lower parts of the extremity. Neurologic: CN 2-12 grossly intact. Sensation intact, Right hemiplegia.     Data Reviewed: I have personally reviewed following labs and imaging studies  Micro Results No results found for this or any previous visit (from the past 240 hour(s)).  Radiology Reports Dg Pelvis 1-2 Views  Result Date: 06/11/2016 CLINICAL DATA:  Recent fall with pelvic pain, initial encounter EXAM: PELVIS - 1-2 VIEW COMPARISON:  None. FINDINGS: Pelvic ring appears intact. The proximal right femur is within normal limits. Some linear bony densities are noted superimposed over the right hip joint likely related to dystrophic bone formation although the possibility of a pelvic fracture in the posterior aspect of the right acetabulum could not be excluded. CT would be helpful for further evaluation. IMPRESSION: Irregular bony density overlying the right hip joint which is likely chronic although the possibility of an acute acetabular fracture deserves consideration. CT of the pelvis is recommended for further evaluation. Electronically Signed   By: Ann Lynch M.D.   On: 06/11/2016 18:30   Dg Knee 1-2 Views Left  Result Date: 06/11/2016 CLINICAL DATA:  Altered mental status and recent fall with left knee pain, initial encounter EXAM: LEFT KNEE - 2 VIEW COMPARISON:  None. FINDINGS: No evidence of fracture, dislocation, or joint effusion. No evidence of arthropathy or other focal bone abnormality. Soft tissues are unremarkable.  IMPRESSION: No acute abnormality noted. Electronically Signed   By: Ann Lynch M.D.   On: 06/11/2016 18:31   Ct Head Wo Contrast  Result Date: 06/11/2016 CLINICAL DATA:  Altered mental status. EXAM: CT HEAD WITHOUT CONTRAST TECHNIQUE: Contiguous axial images were obtained from the base of the skull through the vertex without intravenous contrast. COMPARISON:  Head CT 06/08/2013 and MRI 06/09/2013 FINDINGS: Brain: A chronic left thalamic infarct is again seen with associated ex vacuo dilatation of the left lateral ventricle and wallerian degeneration along the corticospinal tract. Confluent gliosis extending superiorly into the centrum semiovale is unchanged. Chronic small vessel ischemic changes in the right cerebral white matter are also similar to the prior studies. No acute infarct, acute intracranial hemorrhage, mass, or extra-axial fluid collection is seen. Vascular: Calcified atherosclerosis at the skullbase, age advanced. No hyperdense vessel. Skull: No fracture or focal osseous lesion. Sinuses/Orbits: Paranasal sinuses and mastoid air cells are clear. Unremarkable orbits. Other: None. IMPRESSION: 1.  No evidence of acute intracranial abnormality. 2. Chronic left thalamic infarct and chronic small vessel ischemic disease. Electronically Signed   By: Logan Bores M.D.   On: 06/11/2016 20:56   Dg Chest Port 1 View  Result Date: 06/11/2016 CLINICAL DATA:  Altered mental status EXAM: PORTABLE CHEST 1 VIEW COMPARISON:  06/08/2013 FINDINGS: Cardiac shadow is mildly enlarged. The lungs are well aerated bilaterally. No focal infiltrate or sizable effusion is seen. Previously seen masslike densities are less well appreciated on the current exam. IMPRESSION: No acute abnormality seen. Electronically Signed   By: Ann Lynch M.D.   On: 06/11/2016 18:28   Dg Femur Min 2 Views Left  Result Date: 06/11/2016 CLINICAL DATA:  Left leg pain following recent fall EXAM: LEFT FEMUR 2 VIEWS COMPARISON:  None.  FINDINGS: Degenerative changes of left hip joint are seen. No acute fracture or dislocation is noted. Vascular calcifications are seen. No soft tissue abnormality is noted. IMPRESSION: Degenerative change without acute abnormality. Electronically Signed   By: Ann Lynch M.D.   On: 06/11/2016 18:29     CBC  Recent Labs Lab 06/11/16 1703 06/12/16 0632  WBC 13.2* 12.0*  HGB 8.7* 8.4*  HCT 26.8* 25.1*  PLT 171 169  MCV 90.8 90.3  MCH 29.5 30.2  MCHC 32.5 33.5  RDW 13.5 13.5  LYMPHSABS 1.1 0.7  MONOABS 1.2* 1.4*  EOSABS 0.1 0.1  BASOSABS 0.0 0.0    Chemistries   Recent Labs Lab 06/11/16 1703 06/12/16 0632  NA 131* 130*  K 3.8 3.2*  CL 94* 97*  CO2 23 20*  GLUCOSE 131* 165*  BUN 63* 57*  CREATININE 5.22* 4.10*  CALCIUM 8.9 8.2*  MG  --  2.2  AST 114* 108*  ALT 51 48  ALKPHOS 111 106  BILITOT 1.3* 1.5*   ------------------------------------------------------------------------------------------------------------------ estimated creatinine clearance is 17 mL/min (A) (by C-G formula based on SCr of 4.1 mg/dL (H)). ------------------------------------------------------------------------------------------------------------------ No results for input(s): HGBA1C in the last 72 hours. ------------------------------------------------------------------------------------------------------------------ No results for input(s): CHOL, HDL, LDLCALC, TRIG, CHOLHDL, LDLDIRECT in the last 72 hours. ------------------------------------------------------------------------------------------------------------------ No results for input(s): TSH, T4TOTAL, T3FREE, THYROIDAB in the last 72 hours.  Invalid input(s): FREET3 ------------------------------------------------------------------------------------------------------------------ No results for input(s): VITAMINB12, FOLATE, FERRITIN, TIBC, IRON, RETICCTPCT in the last 72 hours.  Coagulation profile No results for input(s): INR, PROTIME  in the last 168 hours.  No results for input(s): DDIMER in the last 72 hours.  Cardiac Enzymes No results for input(s): CKMB, TROPONINI, MYOGLOBIN in the last 168 hours.  Invalid input(s): CK ------------------------------------------------------------------------------------------------------------------ Invalid input(s): POCBNP   CBG: No results for input(s): GLUCAP in the last 168 hours.     Studies: Dg Pelvis 1-2 Views  Result Date: 06/11/2016 CLINICAL DATA:  Recent fall with pelvic pain, initial encounter EXAM: PELVIS - 1-2 VIEW COMPARISON:  None. FINDINGS: Pelvic ring appears intact. The proximal right femur is within normal limits. Some linear bony densities are noted superimposed over the right hip joint likely related to dystrophic bone formation although the possibility of a pelvic fracture in the posterior aspect of the right acetabulum could not be excluded. CT would be helpful for further evaluation. IMPRESSION: Irregular bony density overlying the right hip joint which is likely chronic although the possibility of an acute acetabular fracture deserves consideration. CT of the pelvis is recommended for further evaluation. Electronically Signed   By: Ann Lynch M.D.   On: 06/11/2016 18:30   Dg Knee 1-2 Views Left  Result Date: 06/11/2016 CLINICAL  DATA:  Altered mental status and recent fall with left knee pain, initial encounter EXAM: LEFT KNEE - 2 VIEW COMPARISON:  None. FINDINGS: No evidence of fracture, dislocation, or joint effusion. No evidence of arthropathy or other focal bone abnormality. Soft tissues are unremarkable. IMPRESSION: No acute abnormality noted. Electronically Signed   By: Ann Lynch M.D.   On: 06/11/2016 18:31   Ct Head Wo Contrast  Result Date: 06/11/2016 CLINICAL DATA:  Altered mental status. EXAM: CT HEAD WITHOUT CONTRAST TECHNIQUE: Contiguous axial images were obtained from the base of the skull through the vertex without intravenous contrast.  COMPARISON:  Head CT 06/08/2013 and MRI 06/09/2013 FINDINGS: Brain: A chronic left thalamic infarct is again seen with associated ex vacuo dilatation of the left lateral ventricle and wallerian degeneration along the corticospinal tract. Confluent gliosis extending superiorly into the centrum semiovale is unchanged. Chronic small vessel ischemic changes in the right cerebral white matter are also similar to the prior studies. No acute infarct, acute intracranial hemorrhage, mass, or extra-axial fluid collection is seen. Vascular: Calcified atherosclerosis at the skullbase, age advanced. No hyperdense vessel. Skull: No fracture or focal osseous lesion. Sinuses/Orbits: Paranasal sinuses and mastoid air cells are clear. Unremarkable orbits. Other: None. IMPRESSION: 1. No evidence of acute intracranial abnormality. 2. Chronic left thalamic infarct and chronic small vessel ischemic disease. Electronically Signed   By: Logan Bores M.D.   On: 06/11/2016 20:56   Dg Chest Port 1 View  Result Date: 06/11/2016 CLINICAL DATA:  Altered mental status EXAM: PORTABLE CHEST 1 VIEW COMPARISON:  06/08/2013 FINDINGS: Cardiac shadow is mildly enlarged. The lungs are well aerated bilaterally. No focal infiltrate or sizable effusion is seen. Previously seen masslike densities are less well appreciated on the current exam. IMPRESSION: No acute abnormality seen. Electronically Signed   By: Ann Lynch M.D.   On: 06/11/2016 18:28   Dg Femur Min 2 Views Left  Result Date: 06/11/2016 CLINICAL DATA:  Left leg pain following recent fall EXAM: LEFT FEMUR 2 VIEWS COMPARISON:  None. FINDINGS: Degenerative changes of left hip joint are seen. No acute fracture or dislocation is noted. Vascular calcifications are seen. No soft tissue abnormality is noted. IMPRESSION: Degenerative change without acute abnormality. Electronically Signed   By: Ann Lynch M.D.   On: 06/11/2016 18:29      Lab Results  Component Value Date   HGBA1C 5.3  06/09/2013   Lab Results  Component Value Date   LDLCALC 131 (H) 06/09/2013   CREATININE 4.10 (H) 06/12/2016       Scheduled Meds: . enoxaparin (LOVENOX) injection  30 mg Subcutaneous Daily   Continuous Infusions: . sodium chloride 125 mL/hr at 06/12/16 0027  . piperacillin-tazobactam (ZOSYN)  IV Stopped (06/12/16 0631)  . [START ON 06/13/2016] vancomycin       LOS: 1 day    Time spent: >30 MINS    Reyne Dumas  [facility] Pager [phone]. If 7PM-7AM, please contact night-coverage at [email or web address], password [email or web address] 06/12/2016, 8:55 AM  LOS: 1 day

## 2016-06-12 NOTE — Consult Note (Signed)
[facility] [place]  Patient name: Ann Lynch MRN: [id number] DOB: Oct 30, 1960 Sex: female  REASON FOR CONSULT: Extensive bilateral lower extremity DVT  HPI: Ann Lynch is a 56 y.o. female, who is seen today for evaluation of extensive bilateral [place] DVT. She presented to the emergency room yesterday with the swelling in her left lower extremity and pain. She had fallen approximately 2 days ago and had swelling since that time. She has a past history of a major left brain stroke with right side hemiplegia. She does have some ectasia as well. She is able to answer questions appropriately with me today. She denies any discomfort in her right leg. She denies any swelling in her right leg. She does report that her swelling has diminished some since admission.  Past Medical History:  Diagnosis Date  . Hypertension   . Stroke [facility])     Family History  Problem Relation Age of Onset  . Hypertension Mother   . Diabetes Mellitus II Mother   . Hypertension Father     SOCIAL HISTORY: Social History   Social History  . Marital status: Single    Spouse name: N/A  . Number of children: N/A  . Years of education: N/A   Occupational History  . Not on file.   Social History Main Topics  . Smoking status: Never Smoker  . Smokeless tobacco: Never Used  . Alcohol use No  . Drug use: Unknown  . Sexual activity: Not on file   Other Topics Concern  . Not on file   Social History Narrative  . No narrative on file    No Known Allergies  Current Facility-Administered Medications  Medication Dose Route Frequency Provider Last Rate Last Dose  . 0.9 %  sodium chloride infusion   Intravenous Continuous Reubin Milan, MD 125 mL/hr at 06/12/16 1319    . amitriptyline (ELAVIL) tablet 25 mg  25 mg Oral QHS Reubin Milan, MD      . aspirin EC tablet 81 mg  81 mg Oral Daily Reubin Milan, MD   81  mg at 06/12/16 1257  . atorvastatin (LIPITOR) tablet 40 mg  40 mg Oral QHS Reubin Milan, MD      . cloNIDine (CATAPRES) tablet 0.2 mg  0.2 mg Oral QID Reubin Milan, MD   0.2 mg at 06/12/16 1258  . DULoxetine (CYMBALTA) DR capsule 20 mg  20 mg Oral QHS Reubin Milan, MD      . feeding supplement (NEPRO CARB STEADY) liquid 237 mL  237 mL Oral BID BM Reyne Dumas, MD   237 mL at 06/12/16 1318  . folic acid (FOLVITE) tablet 1 mg  1 mg Oral Daily Reubin Milan, MD   1 mg at 06/12/16 1258  . gabapentin (NEURONTIN) capsule 400 mg  400 mg Oral TID Reubin Milan, MD   400 mg at 06/12/16 1314  . heparin ADULT infusion 100 units/mL (25000 units/270mL sodium chloride 0.45%)  1,100 Units/hr Intravenous Continuous Reyne Dumas, MD 11 mL/hr at 06/12/16 1300 1,100 Units/hr at 06/12/16 1300  . metoprolol tartrate (LOPRESSOR) tablet 100 mg  100 mg Oral BID Reubin Milan, MD   100 mg at 06/12/16 1314  . ondansetron (ZOFRAN) tablet 4 mg  4 mg Oral Q6H PRN Reubin Milan, MD       Or  . ondansetron [facility]) injection 4 mg  4 mg Intravenous Q6H PRN Reubin Milan,  MD      . oxyCODONE-acetaminophen (PERCOCET/ROXICET) 5-325 MG per tablet 1 tablet  1 tablet Oral Q6H PRN Reubin Milan, MD      . phenytoin (DILANTIN) ER capsule 100 mg  100 mg Oral TID Reubin Milan, MD   100 mg at 06/12/16 1258  . piperacillin-tazobactam (ZOSYN) IVPB 2.25 g  2.25 g Intravenous Q8H Reubin Milan, MD   Stopped at 06/12/16 1344  . potassium chloride SA (K-DUR,KLOR-CON) CR tablet 40 mEq  40 mEq Oral BID Reyne Dumas, MD   40 mEq at 06/12/16 1259  . [START ON 06/13/2016] vancomycin (VANCOCIN) IVPB 1000 mg/200 mL premix  1,000 mg Intravenous Q48H Reubin Milan, MD        REVIEW OF SYSTEMS:  [X]  denotes positive finding, [ ]  denotes negative finding Cardiac  Comments:  Chest pain or chest pressure:    Shortness of breath upon exertion:    Short of breath when lying  flat:    Irregular heart rhythm:        Vascular    Pain in calf, thigh, or hip brought on by ambulation:    Pain in feet at night that wakes you up from your sleep:     Blood clot in your veins:    Leg swelling:  x       Pulmonary    Oxygen at home:    Productive cough:     Wheezing:         Neurologic    Sudden weakness in arms or legs:  x   Sudden numbness in arms or legs:  x   Sudden onset of difficulty speaking or slurred speech: x   Temporary loss of vision in one eye:     Problems with dizziness:         Gastrointestinal    Blood in stool:     Vomited blood:         Genitourinary    Burning when urinating:     Blood in urine:        Psychiatric    Major depression:         Hematologic    Bleeding problems:    Problems with blood clotting too easily:        Skin    Rashes or ulcers:        Constitutional    Fever or chills:      PHYSICAL EXAM: Vitals:   06/11/16 2308 06/12/16 0326 06/12/16 0333 06/12/16 1248  BP: 140/89  128/80 (!) 145/92  Pulse: (!) 104  (!) 110 (!) 117  Resp: 18  18 19   Temp: 98.8 F (37.1 C)  98.6 F (37 C) 99.7 F (37.6 C)  TempSrc: Oral  Oral Oral  SpO2: 99%  96% 97%  Weight:  170 lb (77.1 kg)    Height:  5\' 7"  (1.702 m)      GENERAL: The patient is a well-nourished female, in no acute distress. The vital signs are documented above.She is paralyzed on the right CARDIOVASCULAR: 2+ radial and 2+ dorsalis pedis pulses bilaterally. Marked swelling in her left leg throughout her leg compared to the right. Some tenderness in her left calf and no tenderness in her right calf PULMONARY: There is good air exchange  ABDOMEN: Soft and non-tender  MUSCULOSKELETAL: There are no major deformities or cyanosis. NEUROLOGIC:   The right SKIN: There are no ulcers or rashes noted. PSYCHIATRIC: The patient has a normal affect.  DATA:  CT of her abdomen and pelvis suggested pelvic DVT. Duplex shows iliac, common femoral and femoral vein DVT  bilaterally  Also with acute renal insufficiency with a creatinine of 5 on admission and 4.1 today.  MEDICAL ISSUES: No evidence of phlegmasia. Do not feel that she is a candidate for lysis due to her renal insufficiency. Discussed this at length with the patient. Recommend anticoagulation as you are doing. Feel that she is at prohibitive risk for renal failure with attempts at thrombolysis for mechanical thrombectomy. Will not follow actively. Please call if we can assist.   Rosetta Posner, MD [facility] Tel [phone] Pager [phone]

## 2016-06-12 NOTE — Progress Notes (Signed)
CRITICAL VALUE ALERT  Critical Value:  CT Pelvis  Date & Time Notied: 06/12/16, 1340  Provider Notified: Dr. Allyson Sabal has seen the patient and discussed the results prior to being notified of results by CT.   Orders Received/Actions taken: Patient started on IV heparin and bolused w/ pharmacy consult.

## 2016-06-12 NOTE — Progress Notes (Signed)
Pharmacy Antibiotic Note Ann Lynch is a 56 y.o. female admitted on 06/11/2016 with cellulitis and AKI.  Pharmacy has been consulted for Zosyn and vancomycin dosing.  Plan: 1. Vancomycin 1000 IV every 48 hours.  Goal trough 10-15 mcg/mL.  2. Zosyn 2.25 grams IV every 8 hours (infused over 30 minutes) 3. Follow up renal function and empirically adjust abx as needed  4. Deescalate antibiotics as feasible   Height: 5\' 7"  (170.2 cm) Weight: 170 lb (77.1 kg) IBW/kg (Calculated) : 61.6  Temp (24hrs), Avg:98.9 F (37.2 C), Min:98.6 F (37 C), Max:99.4 F (37.4 C)   Recent Labs Lab 06/11/16 1703 06/11/16 1727 06/11/16 1820 06/11/16 2025  WBC 13.2*  --   --   --   CREATININE 5.22*  --   --   --   LATICACIDVEN  --  1.12 1.88 0.67    Estimated Creatinine Clearance: 13.3 mL/min (A) (by C-G formula based on SCr of 5.22 mg/dL (H)).    No Known Allergies  Antimicrobials this admission: 5/25 Zosyn >>  5/25 vancomycin >>    Microbiology results: 5/25 BCx: px 5/25 UCx: px   Thank you for allowing pharmacy to be a part of this patient's care.  Vincenza Hews, PharmD, BCPS 06/12/2016, 3:47 AM
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
## 2016-06-13 LAB — COMPREHENSIVE METABOLIC PANEL
ALBUMIN: 2 g/dL — AB (ref 3.5–5.0)
ALK PHOS: 113 U/L (ref 38–126)
ALT: 46 U/L (ref 14–54)
ANION GAP: 9 (ref 5–15)
AST: 96 U/L — ABNORMAL HIGH (ref 15–41)
BUN: 43 mg/dL — ABNORMAL HIGH (ref 6–20)
CALCIUM: 8.1 mg/dL — AB (ref 8.9–10.3)
CO2: 22 mmol/L (ref 22–32)
Chloride: 105 mmol/L (ref 101–111)
Creatinine, Ser: 2.92 mg/dL — ABNORMAL HIGH (ref 0.44–1.00)
GFR calc Af Amer: 20 mL/min — ABNORMAL LOW (ref >60)
GFR calc non Af Amer: 17 mL/min — ABNORMAL LOW (ref >60)
GLUCOSE: 129 mg/dL — AB (ref 65–99)
POTASSIUM: 4.5 mmol/L (ref 3.5–5.1)
SODIUM: 136 mmol/L (ref 135–145)
Total Bilirubin: 1.4 mg/dL — ABNORMAL HIGH (ref 0.3–1.2)
Total Protein: 6.7 g/dL (ref 6.5–8.1)

## 2016-06-13 LAB — BLOOD CULTURE ID PANEL (REFLEXED)
ACINETOBACTER BAUMANNII: NOT DETECTED
CANDIDA ALBICANS: NOT DETECTED
CANDIDA GLABRATA: NOT DETECTED
CANDIDA PARAPSILOSIS: NOT DETECTED
Candida krusei: NOT DETECTED
Candida tropicalis: NOT DETECTED
ENTEROBACTER CLOACAE COMPLEX: NOT DETECTED
ENTEROBACTERIACEAE SPECIES: NOT DETECTED
Enterococcus species: NOT DETECTED
Escherichia coli: NOT DETECTED
HAEMOPHILUS INFLUENZAE: NOT DETECTED
KLEBSIELLA OXYTOCA: NOT DETECTED
Klebsiella pneumoniae: NOT DETECTED
Listeria monocytogenes: NOT DETECTED
Neisseria meningitidis: NOT DETECTED
Proteus species: NOT DETECTED
Pseudomonas aeruginosa: NOT DETECTED
STREPTOCOCCUS AGALACTIAE: NOT DETECTED
STREPTOCOCCUS PYOGENES: NOT DETECTED
STREPTOCOCCUS SPECIES: NOT DETECTED
Serratia marcescens: NOT DETECTED
Staphylococcus aureus (BCID): NOT DETECTED
Staphylococcus species: NOT DETECTED
Streptococcus pneumoniae: NOT DETECTED

## 2016-06-13 LAB — CBC WITH DIFFERENTIAL/PLATELET
BASOS ABS: 0 10*3/uL (ref 0.0–0.1)
BASOS PCT: 0 %
EOS ABS: 0.1 10*3/uL (ref 0.0–0.7)
Eosinophils Relative: 1 %
HCT: 24.1 % — ABNORMAL LOW (ref 36.0–46.0)
Hemoglobin: 7.9 g/dL — ABNORMAL LOW (ref 12.0–15.0)
Lymphocytes Relative: 8 %
Lymphs Abs: 0.8 10*3/uL (ref 0.7–4.0)
MCH: 29.5 pg (ref 26.0–34.0)
MCHC: 32.8 g/dL (ref 30.0–36.0)
MCV: 89.9 fL (ref 78.0–100.0)
MONO ABS: 1.5 10*3/uL — AB (ref 0.1–1.0)
Monocytes Relative: 15 %
NEUTROS ABS: 7.6 10*3/uL (ref 1.7–7.7)
Neutrophils Relative %: 76 %
PLATELETS: 171 10*3/uL (ref 150–400)
RBC: 2.68 MIL/uL — ABNORMAL LOW (ref 3.87–5.11)
RDW: 13.6 % (ref 11.5–15.5)
WBC: 10 10*3/uL (ref 4.0–10.5)

## 2016-06-13 LAB — HEPARIN LEVEL (UNFRACTIONATED): Heparin Unfractionated: 0.58 IU/mL (ref 0.30–0.70)

## 2016-06-13 LAB — URINE CULTURE: Culture: NO GROWTH

## 2016-06-13 LAB — CK: CK TOTAL: 129 U/L (ref 38–234)

## 2016-06-13 NOTE — Progress Notes (Signed)
[place] for heparin Indication: DVT  No Known Allergies  Patient Measurements: Height: 5\' 7"  (170.2 cm) Weight: 170 lb (77.1 kg) IBW/kg (Calculated) : 61.6 Heparin Dosing Weight: 77 kg  Vital Signs: Temp: 99.3 F (37.4 C) (05/27 0335) Temp Source: Oral (05/27 0335) BP: 156/98 (05/27 0954) Pulse Rate: 106 (05/27 0953)  Labs:  Recent Labs  06/11/16 1703 06/12/16 0632 06/12/16 1153 06/12/16 1949 06/13/16 0245  HGB 8.7* 8.4*  --   --  7.9*  HCT 26.8* 25.1*  --   --  24.1*  PLT 171 169  --   --  171  LABPROT  --   --  14.4  --   --   INR  --   --  1.11  --   --   HEPARINUNFRC  --   --   --  0.54 0.58  CREATININE 5.22* 4.10*  --   --  2.92*  CKTOTAL  --  238*  --   --  129     Medical History: Past Medical History:  Diagnosis Date  . Hypertension   . Stroke [facility])      Assessment: 56 yo female admitted with lower extremity pain after falling a couple days ago. She is wheelchair bound at baseline. Dopplers were positive for extensive bilateral DVTs in R and L lower extremities. hgb 7.9, plts wnl. Baseline SCr down from admint 4.1 > 2.9, eCrCl < 30. Will need to carefully assess patient's baseline SCr before deciding on oral anticoagulation option.    Goal of Therapy:  Heparin level 0.3-0.7 units/ml Monitor platelets by anticoagulation protocol: Yes    Plan:  -Continue heparin at 1100 units/hr -Daily HL, CBC -F/u plan for oral anticoagulation   Eliot Popper, Jake Church 06/13/2016,10:19 AM

## 2016-06-13 NOTE — Progress Notes (Signed)
  PHARMACY - PHYSICIAN COMMUNICATION CRITICAL VALUE ALERT - BLOOD CULTURE IDENTIFICATION (BCID)  Results for orders placed or performed during the hospital encounter of 06/11/16  Blood Culture ID Panel (Reflexed) (Collected: 06/11/2016  5:24 PM)  Result Value Ref Range   Enterococcus species NOT DETECTED NOT DETECTED   Listeria monocytogenes NOT DETECTED NOT DETECTED   Staphylococcus species NOT DETECTED NOT DETECTED   Staphylococcus aureus NOT DETECTED NOT DETECTED   Streptococcus species NOT DETECTED NOT DETECTED   Streptococcus agalactiae NOT DETECTED NOT DETECTED   Streptococcus pneumoniae NOT DETECTED NOT DETECTED   Streptococcus pyogenes NOT DETECTED NOT DETECTED   Acinetobacter baumannii NOT DETECTED NOT DETECTED   Enterobacteriaceae species NOT DETECTED NOT DETECTED   Enterobacter cloacae complex NOT DETECTED NOT DETECTED   Escherichia coli NOT DETECTED NOT DETECTED   Klebsiella oxytoca NOT DETECTED NOT DETECTED   Klebsiella pneumoniae NOT DETECTED NOT DETECTED   Proteus species NOT DETECTED NOT DETECTED   Serratia marcescens NOT DETECTED NOT DETECTED   Haemophilus influenzae NOT DETECTED NOT DETECTED   Neisseria meningitidis NOT DETECTED NOT DETECTED   Pseudomonas aeruginosa NOT DETECTED NOT DETECTED   Candida albicans NOT DETECTED NOT DETECTED   Candida glabrata NOT DETECTED NOT DETECTED   Candida krusei NOT DETECTED NOT DETECTED   Candida parapsilosis NOT DETECTED NOT DETECTED   Candida tropicalis NOT DETECTED NOT DETECTED    Name of physician (or Provider) Contacted: Dr. Allyson Sabal (via text page)  Changes to prescribed antibiotics required: None - antibiotics discontinued today. Per micro - likely diptheroids and deemed contaminant.   Lawson Radar 06/13/2016  6:02 PM

## 2016-06-13 NOTE — Progress Notes (Signed)
[name] PROGRESS NOTE  Ann Lynch MAU:[id number] DOB: 15-Jun-1960 DOA: 06/11/2016   PCP: [name], MD     Assessment/Plan: Principal Problem:   Sepsis due to cellulitis [facility]) Active Problems:   AKI (acute kidney injury) ([place])   CVA, old, hemiparesis ([place])   HTN (hypertension)   56 y.o. female with medical history significant of hypertension, CVA with right-sided hemiplegia who was brought to the emergency department with left lower extremity pain after having a fall 2 days ago, followed by drowsiness since yesterday. Patient presented with altered mental status and left leg pain. Admitted for cellulitis, acute kidney injury,  Assessment and plan Left leg pain,doubt cellulitis or sepsis  Continue telemetry, admission lactic acid within normal limits,   Started on  vancomycin and Zosyn per pharmacy. Day #3 Dc abx , no apparent source of  infection Follow-up blood cultures and sensitivity. Leukocytosis   improving Primary issue seems to be extensive DVT      AKI (acute kidney injury) ([place]) presented with a creatinine of 5.22>now 2.92 Hold Micardis HCT. Hyponatremia likely secondary to HCTZ Hold the spironolactone in the setting of dehydration. Renal ultrasound shows medical renal disease without any hydronephrosis Renal function is improving Creatinine improving slowly, baseline unknown but probably within normal range based on 06/10/13 CK normal today, IV fluids discontinued    CVA, old, hemiparesis ([place]) Supportive care. Continue prophylactic phenytoin 100 mg by mouth 3 times a day. Check phenytoin level.    HTN (hypertension) Continue metoprolol 100 mg by mouth twice a day. Continue clonidine 0.3 mg by mouth 3 times a day Monitor blood pressure.   Extensive pelvic and B/L LE DVT's  Left - Positive for acute DVT and age indeterminate DVT in the iliac, common femoral, femoral, and popliteal veins. There is also superficial thromobosis of  the saphenofemoral junction and the proximal greater saphenous vein. Right - Positive for acute DVT and age indeterminate DVT of the iliac, common femoral, femoral, and popliteal veins  continue heparin drip, day #2 Not a candidate for thrombolysis due to renal insufficiency, per vascular surgery Patient would need outpatient age-appropriate screening for malignancy to be arranged for by PCP     DVT prophylaxsis heparin gtt  Code Status:  Full code    Family Communication: Discussed in detail with the patient's mother and sister by the bedside , all imaging results, lab results explained to the patient   Disposition Plan:  Anticipate discharge in 2-3 days     Consultants:  Vascular  Procedures:  None  Antibiotics: Anti-infectives    Start     Dose/Rate Route Frequency Ordered Stop   06/13/16 2100  vancomycin (VANCOCIN) IVPB 1000 mg/200 mL premix     1,000 mg 200 mL/hr over 60 Minutes Intravenous Every 48 hours 06/12/16 0341     06/12/16 0600  piperacillin-tazobactam (ZOSYN) IVPB 2.25 g     2.25 g 100 mL/hr over 30 Minutes Intravenous Every 8 hours 06/12/16 0137     06/12/16 0000  vancomycin (VANCOCIN) IVPB 1000 mg/200 mL premix     1,000 mg 200 mL/hr over 60 Minutes Intravenous  Once 06/11/16 2258 06/12/16 0131   06/11/16 1745  piperacillin-tazobactam (ZOSYN) IVPB 3.375 g     3.375 g 100 mL/hr over 30 Minutes Intravenous  Once 06/11/16 1741 06/11/16 2034         HPI/Subjective: Mother by bedside , patient sleepy , but oriented   Objective: Vitals:   06/12/16 5638  06/12/16 1248 06/12/16 1949 06/13/16 0335  BP: 128/80 (!) 145/92 124/84 117/75  Pulse: (!) 110 (!) 117 (!) 105 92  Resp: 18 19 18 18   Temp: 98.6 F (37 C) 99.7 F (37.6 C) 98.6 F (37 C) 99.3 F (37.4 C)  TempSrc: Oral Oral Oral Oral  SpO2: 96% 97% 98% 96%  Weight:      Height:        Intake/Output Summary (Last 24 hours) at 06/13/16 0915 Last data filed at 06/13/16 0600  Gross per 24  hour  Intake          3410.33 ml  Output             2400 ml  Net          1010.33 ml    Exam:  Cardiovascular: Regular rate and rhythm, no murmurs / rubs / gallops. No extremity edema. 2+ pedal pulses. No carotid bruits.  Abdomen: Soft, no tenderness, no masses palpated. No hepatosplenomegaly. Bowel sounds positive.  Musculoskeletal: no clubbing / cyanosis. Decreased ROM of right sided extremities, no contractures. Atrophy and decreased muscle tone of right-sided extremities.  Skin: Positive erythema, edema, calor and tenderness of left lower extremity at the medial thigh extending further to the lower parts of the extremity. Neurologic: CN 2-12 grossly intact. Sensation intact, Right hemiplegia.     Data Reviewed: I have personally reviewed following labs and imaging studies  Micro Results Recent Results (from the past 240 hour(s))  Culture, blood (routine x 2)     Status: None (Preliminary result)   Collection Time: 06/11/16  5:19 PM  Result Value Ref Range Status   Specimen Description BLOOD LEFT ANTECUBITAL  Final   Special Requests   Final    BOTTLES DRAWN AEROBIC AND ANAEROBIC Blood Culture adequate volume   Culture NO GROWTH < 24 HOURS  Final   Report Status PENDING  Incomplete  Culture, blood (routine x 2)     Status: None (Preliminary result)   Collection Time: 06/11/16  5:24 PM  Result Value Ref Range Status   Specimen Description BLOOD LEFT FOREARM  Final   Special Requests   Final    BOTTLES DRAWN AEROBIC AND ANAEROBIC Blood Culture results may not be optimal due to an inadequate volume of blood received in culture bottles   Culture NO GROWTH < 24 HOURS  Final   Report Status PENDING  Incomplete  Urine culture     Status: None   Collection Time: 06/11/16  9:07 PM  Result Value Ref Range Status   Specimen Description URINE, CLEAN CATCH  Final   Special Requests NONE  Final   Culture NO GROWTH  Final   Report Status 06/13/2016 FINAL  Final    Radiology  Reports Dg Pelvis 1-2 Views  Result Date: 06/11/2016 CLINICAL DATA:  Recent fall with pelvic pain, initial encounter EXAM: PELVIS - 1-2 VIEW COMPARISON:  None. FINDINGS: Pelvic ring appears intact. The proximal right femur is within normal limits. Some linear bony densities are noted superimposed over the right hip joint likely related to dystrophic bone formation although the possibility of a pelvic fracture in the posterior aspect of the right acetabulum could not be excluded. CT would be helpful for further evaluation. IMPRESSION: Irregular bony density overlying the right hip joint which is likely chronic although the possibility of an acute acetabular fracture deserves consideration. CT of the pelvis is recommended for further evaluation. Electronically Signed   By: Linus Mako.D.  On: 06/11/2016 18:30   Dg Knee 1-2 Views Left  Result Date: 06/11/2016 CLINICAL DATA:  Altered mental status and recent fall with left knee pain, initial encounter EXAM: LEFT KNEE - 2 VIEW COMPARISON:  None. FINDINGS: No evidence of fracture, dislocation, or joint effusion. No evidence of arthropathy or other focal bone abnormality. Soft tissues are unremarkable. IMPRESSION: No acute abnormality noted. Electronically Signed   By: [name] M.D.   On: 06/11/2016 18:31   Ct Head Wo Contrast  Result Date: 06/11/2016 CLINICAL DATA:  Altered mental status. EXAM: CT HEAD WITHOUT CONTRAST TECHNIQUE: Contiguous axial images were obtained from the base of the skull through the vertex without intravenous contrast. COMPARISON:  Head CT 06/08/2013 and MRI 06/09/2013 FINDINGS: Brain: A chronic left thalamic infarct is again seen with associated ex vacuo dilatation of the left lateral ventricle and wallerian degeneration along the corticospinal tract. Confluent gliosis extending superiorly into the centrum semiovale is unchanged. Chronic small vessel ischemic changes in the right cerebral white matter are also similar to the  prior studies. No acute infarct, acute intracranial hemorrhage, mass, or extra-axial fluid collection is seen. Vascular: Calcified atherosclerosis at the skullbase, age advanced. No hyperdense vessel. Skull: No fracture or focal osseous lesion. Sinuses/Orbits: Paranasal sinuses and mastoid air cells are clear. Unremarkable orbits. Other: None. IMPRESSION: 1. No evidence of acute intracranial abnormality. 2. Chronic left thalamic infarct and chronic small vessel ischemic disease. Electronically Signed   By: Logan Bores M.D.   On: 06/11/2016 20:56   US Renal  Result Date: 06/12/2016 CLINICAL DATA:  Acute kidney injury, hypertension EXAM: RENAL / URINARY TRACT ULTRASOUND COMPLETE COMPARISON:  06/08/2013 FINDINGS: Right Kidney: Length: 10.2 cm. Normal cortical thickness. Increased cortical echogenicity. No mass, hydronephrosis or shadowing calcification. Left Kidney: Length: 10.8 cm. Normal cortical thickness. Increased cortical echogenicity. No mass, hydronephrosis or shadowing calcification. Small amount of perinephric fluid at inferior pole. Bladder: Appears normal for degree of bladder distention. Incidentally noted minimal ascites adjacent to liver. IMPRESSION: Medical renal disease changes of both kidneys without mass or hydronephrosis. Minimal ascites. Electronically Signed   By: Lavonia Dana M.D.   On: 06/12/2016 10:06   Ct Pelvis Limited Wo Contrast  Result Date: 06/12/2016 CLINICAL DATA:  Left thigh pain after recent fall. Abnormal radiographs. EXAM: CT PELVIS WITHOUT CONTRAST TECHNIQUE: Multidetector CT imaging of the pelvis was performed following the standard protocol without intravenous contrast. COMPARISON:  Radiographs 06/11/2016. FINDINGS: Urinary Tract: Mild diffuse bladder wall thickening without apparent focal abnormality. The distal ureters are not well visualized, but do not appear dilated. Bowel: No bowel wall thickening, distention or focal surrounding inflammation identified in the  pelvis. Vascular/Lymphatic: There are asymmetric mildly enlarged left external iliac and inguinal lymph nodes, measuring up to 12 mm short axis. There is edema surrounding the left external iliac and proximal femoral vessels. The left external iliac and femoral veins appear mildly distended with heterogeneous, generally increased density. Appearance is suspicious for DVT. Mild aortoiliac atherosclerosis noted. Reproductive: The uterus appears unremarkable. No evidence of adnexal mass. Other: Moderate amount of free pelvic fluid. There is nonspecific soft tissue edema throughout the pelvis. Musculoskeletal: No evidence of acute fracture, dislocation or femoral head avascular necrosis. 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 This certainly has the potential to restrict external rotation of the hip. The hip joint spaces are maintained. There are mild sacroiliac degenerative changes bilaterally. There is more advanced facet hypertrophy in the lower lumbar spine, worst  on the right at L5-S1. Asymmetric diffuse atrophy of the right hip musculature is noted. IMPRESSION: 1. No acute osseous findings. 2. 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 This may restrict external rotation of the hip and is associated with diffuse right pelvic muscular atrophy. 3. Suspicion of left pelvic and femoral vein DVT. Correlation with venous Doppler recommended. 4. Nonspecific pelvic edema and mild left inguinal adenopathy. 5. These results will be called to the ordering clinician or representative by the Radiologist Assistant, and communication documented in the PACS or zVision Dashboard. Electronically Signed   By: Richardean Sale M.D.   On: 06/12/2016 11:19   Dg Chest Port 1 View  Result Date: 06/11/2016 CLINICAL DATA:  Altered mental status EXAM: PORTABLE CHEST 1 VIEW COMPARISON:  06/08/2013 FINDINGS: Cardiac  shadow is mildly enlarged. The lungs are well aerated bilaterally. No focal infiltrate or sizable effusion is seen. Previously seen masslike densities are less well appreciated on the current exam. IMPRESSION: No acute abnormality seen. Electronically Signed   By: [name] M.D.   On: 06/11/2016 18:28   Dg Femur Min 2 Views Left  Result Date: 06/11/2016 CLINICAL DATA:  Left leg pain following recent fall EXAM: LEFT FEMUR 2 VIEWS COMPARISON:  None. FINDINGS: Degenerative changes of left hip joint are seen. No acute fracture or dislocation is noted. Vascular calcifications are seen. No soft tissue abnormality is noted. IMPRESSION: Degenerative change without acute abnormality. Electronically Signed   By: [name] M.D.   On: 06/11/2016 18:29     CBC  Recent Labs Lab 06/11/16 1703 06/12/16 0632 06/13/16 0245  WBC 13.2* 12.0* 10.0  HGB 8.7* 8.4* 7.9*  HCT 26.8* 25.1* 24.1*  PLT 171 169 171  MCV 90.8 90.3 89.9  MCH 29.5 30.2 29.5  MCHC 32.5 33.5 32.8  RDW 13.5 13.5 13.6  LYMPHSABS 1.1 0.7 0.8  MONOABS 1.2* 1.4* 1.5*  EOSABS 0.1 0.1 0.1  BASOSABS 0.0 0.0 0.0    Chemistries   Recent Labs Lab 06/11/16 1703 06/12/16 0632 06/13/16 0245  NA 131* 130* 136  K 3.8 3.2* 4.5  CL 94* 97* 105  CO2 23 20* 22  GLUCOSE 131* 165* 129*  BUN 63* 57* 43*  CREATININE 5.22* 4.10* 2.92*  CALCIUM 8.9 8.2* 8.1*  MG  --  2.2  --   AST 114* 108* 96*  ALT 51 48 46  ALKPHOS 111 106 113  BILITOT 1.3* 1.5* 1.4*   ------------------------------------------------------------------------------------------------------------------ estimated creatinine clearance is 23.8 mL/min (A) (by C-G formula based on SCr of 2.92 mg/dL (H)). ------------------------------------------------------------------------------------------------------------------ No results for input(s): HGBA1C in the last 72  hours. ------------------------------------------------------------------------------------------------------------------ No results for input(s): CHOL, HDL, LDLCALC, TRIG, CHOLHDL, LDLDIRECT in the last 72 hours. ------------------------------------------------------------------------------------------------------------------  Recent Labs  06/12/16 1125  TSH 0.613   ------------------------------------------------------------------------------------------------------------------ No results for input(s): VITAMINB12, FOLATE, FERRITIN, TIBC, IRON, RETICCTPCT in the last 72 hours.  Coagulation profile  Recent Labs Lab 06/12/16 1153  INR 1.11    No results for input(s): DDIMER in the last 72 hours.  Cardiac Enzymes No results for input(s): CKMB, TROPONINI, MYOGLOBIN in the last 168 hours.  Invalid input(s): CK ------------------------------------------------------------------------------------------------------------------ Invalid input(s): POCBNP   CBG: No results for input(s): GLUCAP in the last 168 hours.     Studies: Dg Pelvis 1-2 Views  Result Date: 06/11/2016 CLINICAL DATA:  Recent fall with pelvic pain, initial encounter EXAM: PELVIS - 1-2 VIEW COMPARISON:  None. FINDINGS: Pelvic ring appears intact. The proximal  right femur is within normal limits. Some linear bony densities are noted superimposed over the right hip joint likely related to dystrophic bone formation although the possibility of a pelvic fracture in the posterior aspect of the right acetabulum could not be excluded. CT would be helpful for further evaluation. IMPRESSION: Irregular bony density overlying the right hip joint which is likely chronic although the possibility of an acute acetabular fracture deserves consideration. CT of the pelvis is recommended for further evaluation. Electronically Signed   By: [name] M.D.   On: 06/11/2016 18:30   Dg Knee 1-2 Views Left  Result Date:  06/11/2016 CLINICAL DATA:  Altered mental status and recent fall with left knee pain, initial encounter EXAM: LEFT KNEE - 2 VIEW COMPARISON:  None. FINDINGS: No evidence of fracture, dislocation, or joint effusion. No evidence of arthropathy or other focal bone abnormality. Soft tissues are unremarkable. IMPRESSION: No acute abnormality noted. Electronically Signed   By: [name] M.D.   On: 06/11/2016 18:31   Ct Head Wo Contrast  Result Date: 06/11/2016 CLINICAL DATA:  Altered mental status. EXAM: CT HEAD WITHOUT CONTRAST TECHNIQUE: Contiguous axial images were obtained from the base of the skull through the vertex without intravenous contrast. COMPARISON:  Head CT 06/08/2013 and MRI 06/09/2013 FINDINGS: Brain: A chronic left thalamic infarct is again seen with associated ex vacuo dilatation of the left lateral ventricle and wallerian degeneration along the corticospinal tract. Confluent gliosis extending superiorly into the centrum semiovale is unchanged. Chronic small vessel ischemic changes in the right cerebral white matter are also similar to the prior studies. No acute infarct, acute intracranial hemorrhage, mass, or extra-axial fluid collection is seen. Vascular: Calcified atherosclerosis at the skullbase, age advanced. No hyperdense vessel. Skull: No fracture or focal osseous lesion. Sinuses/Orbits: Paranasal sinuses and mastoid air cells are clear. Unremarkable orbits. Other: None. IMPRESSION: 1. No evidence of acute intracranial abnormality. 2. Chronic left thalamic infarct and chronic small vessel ischemic disease. Electronically Signed   By: Logan Bores M.D.   On: 06/11/2016 20:56   US Renal  Result Date: 06/12/2016 CLINICAL DATA:  Acute kidney injury, hypertension EXAM: RENAL / URINARY TRACT ULTRASOUND COMPLETE COMPARISON:  06/08/2013 FINDINGS: Right Kidney: Length: 10.2 cm. Normal cortical thickness. Increased cortical echogenicity. No mass, hydronephrosis or shadowing calcification. Left  Kidney: Length: 10.8 cm. Normal cortical thickness. Increased cortical echogenicity. No mass, hydronephrosis or shadowing calcification. Small amount of perinephric fluid at inferior pole. Bladder: Appears normal for degree of bladder distention. Incidentally noted minimal ascites adjacent to liver. IMPRESSION: Medical renal disease changes of both kidneys without mass or hydronephrosis. Minimal ascites. Electronically Signed   By: Lavonia Dana M.D.   On: 06/12/2016 10:06   Ct Pelvis Limited Wo Contrast  Result Date: 06/12/2016 CLINICAL DATA:  Left thigh pain after recent fall. Abnormal radiographs. EXAM: CT PELVIS WITHOUT CONTRAST TECHNIQUE: Multidetector CT imaging of the pelvis was performed following the standard protocol without intravenous contrast. COMPARISON:  Radiographs 06/11/2016. FINDINGS: Urinary Tract: Mild diffuse bladder wall thickening without apparent focal abnormality. The distal ureters are not well visualized, but do not appear dilated. Bowel: No bowel wall thickening, distention or focal surrounding inflammation identified in the pelvis. Vascular/Lymphatic: There are asymmetric mildly enlarged left external iliac and inguinal lymph nodes, measuring up to 12 mm short axis. There is edema surrounding the left external iliac and proximal femoral vessels. The left external iliac and femoral veins appear mildly distended with heterogeneous, generally increased density. Appearance is suspicious for  DVT. Mild aortoiliac atherosclerosis noted. Reproductive: The uterus appears unremarkable. No evidence of adnexal mass. Other: Moderate amount of free pelvic fluid. There is nonspecific soft tissue edema throughout the pelvis. Musculoskeletal: No evidence of acute fracture, dislocation or femoral head avascular necrosis. 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 This certainly has the potential to restrict external rotation of the hip. The hip  joint spaces are maintained. There are mild sacroiliac degenerative changes bilaterally. There is more advanced facet hypertrophy in the lower lumbar spine, worst on the right at L5-S1. Asymmetric diffuse atrophy of the right hip musculature is noted. IMPRESSION: 1. No acute osseous findings. 2. 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 This may restrict external rotation of the hip and is associated with diffuse right pelvic muscular atrophy. 3. Suspicion of left pelvic and femoral vein DVT. Correlation with venous Doppler recommended. 4. Nonspecific pelvic edema and mild left inguinal adenopathy. 5. These results will be called to the ordering clinician or representative by the Radiologist Assistant, and communication documented in the PACS or zVision Dashboard. Electronically Signed   By: Richardean Sale M.D.   On: 06/12/2016 11:19   Dg Chest Port 1 View  Result Date: 06/11/2016 CLINICAL DATA:  Altered mental status EXAM: PORTABLE CHEST 1 VIEW COMPARISON:  06/08/2013 FINDINGS: Cardiac shadow is mildly enlarged. The lungs are well aerated bilaterally. No focal infiltrate or sizable effusion is seen. Previously seen masslike densities are less well appreciated on the current exam. IMPRESSION: No acute abnormality seen. Electronically Signed   By: [name] M.D.   On: 06/11/2016 18:28   Dg Femur Min 2 Views Left  Result Date: 06/11/2016 CLINICAL DATA:  Left leg pain following recent fall EXAM: LEFT FEMUR 2 VIEWS COMPARISON:  None. FINDINGS: Degenerative changes of left hip joint are seen. No acute fracture or dislocation is noted. Vascular calcifications are seen. No soft tissue abnormality is noted. IMPRESSION: Degenerative change without acute abnormality. Electronically Signed   By: [name] M.D.   On: 06/11/2016 18:29      Lab Results  Component Value Date   HGBA1C 5.3 06/09/2013   Lab Results  Component Value Date   LDLCALC  131 (H) 06/09/2013   CREATININE 2.92 (H) 06/13/2016       Scheduled Meds: . amitriptyline  25 mg Oral QHS  . aspirin EC  81 mg Oral Daily  . atorvastatin  40 mg Oral QHS  . cloNIDine  0.2 mg Oral QID  . DULoxetine  20 mg Oral QHS  . feeding supplement (NEPRO CARB STEADY)  237 mL Oral BID BM  . folic acid  1 mg Oral Daily  . gabapentin  400 mg Oral TID  . metoprolol tartrate  100 mg Oral BID  . phenytoin  100 mg Oral TID   Continuous Infusions: . heparin 1,100 Units/hr (06/13/16 0804)  . piperacillin-tazobactam (ZOSYN)  IV 2.25 g (06/13/16 0536)  . vancomycin       LOS: 2 days    Time spent: >30 MINS    Reyne Dumas  [facility] Pager [phone]. If 7PM-7AM, please contact night-coverage at [email or web address], password [facility] 06/13/2016, 9:15 AM  LOS: 2 days

## 2016-06-14 LAB — CBC WITH DIFFERENTIAL/PLATELET
BASOS ABS: 0 10*3/uL (ref 0.0–0.1)
Basophils Relative: 0 %
EOS ABS: 0.2 10*3/uL (ref 0.0–0.7)
EOS PCT: 2 %
HCT: 26.2 % — ABNORMAL LOW (ref 36.0–46.0)
Hemoglobin: 8.5 g/dL — ABNORMAL LOW (ref 12.0–15.0)
LYMPHS ABS: 1.5 10*3/uL (ref 0.7–4.0)
Lymphocytes Relative: 13 %
MCH: 29.4 pg (ref 26.0–34.0)
MCHC: 32.4 g/dL (ref 30.0–36.0)
MCV: 90.7 fL (ref 78.0–100.0)
MONO ABS: 1.6 10*3/uL — AB (ref 0.1–1.0)
Monocytes Relative: 14 %
Neutro Abs: 7.9 10*3/uL — ABNORMAL HIGH (ref 1.7–7.7)
Neutrophils Relative %: 71 %
PLATELETS: 267 10*3/uL (ref 150–400)
RBC: 2.89 MIL/uL — ABNORMAL LOW (ref 3.87–5.11)
RDW: 13.8 % (ref 11.5–15.5)
WBC: 11.2 10*3/uL — AB (ref 4.0–10.5)

## 2016-06-14 LAB — HEPARIN LEVEL (UNFRACTIONATED): Heparin Unfractionated: 0.71 IU/mL — ABNORMAL HIGH (ref 0.30–0.70)

## 2016-06-14 LAB — COMPREHENSIVE METABOLIC PANEL
ALK PHOS: 122 U/L (ref 38–126)
ALT: 43 U/L (ref 14–54)
ANION GAP: 10 (ref 5–15)
AST: 89 U/L — ABNORMAL HIGH (ref 15–41)
Albumin: 2.1 g/dL — ABNORMAL LOW (ref 3.5–5.0)
BILIRUBIN TOTAL: 1.3 mg/dL — AB (ref 0.3–1.2)
BUN: 34 mg/dL — ABNORMAL HIGH (ref 6–20)
CALCIUM: 8.5 mg/dL — AB (ref 8.9–10.3)
CO2: 22 mmol/L (ref 22–32)
Chloride: 103 mmol/L (ref 101–111)
Creatinine, Ser: 2.05 mg/dL — ABNORMAL HIGH (ref 0.44–1.00)
GFR calc non Af Amer: 27 mL/min — ABNORMAL LOW (ref >60)
GFR, EST AFRICAN AMERICAN: 31 mL/min — AB (ref >60)
Glucose, Bld: 119 mg/dL — ABNORMAL HIGH (ref 65–99)
POTASSIUM: 4.2 mmol/L (ref 3.5–5.1)
Sodium: 135 mmol/L (ref 135–145)
TOTAL PROTEIN: 7.3 g/dL (ref 6.5–8.1)

## 2016-06-14 LAB — CK: CK TOTAL: 75 U/L (ref 38–234)

## 2016-06-14 MED ORDER — SODIUM CHLORIDE 0.9 % IV BOLUS (SEPSIS)
500.0000 mL | Freq: Once | INTRAVENOUS | Status: AC
  Administered 2016-06-14: 500 mL via INTRAVENOUS

## 2016-06-14 MED ORDER — CLONIDINE HCL 0.2 MG PO TABS
0.2000 mg | ORAL_TABLET | Freq: Two times a day (BID) | ORAL | Status: DC
  Administered 2016-06-14 – 2016-06-17 (×7): 0.2 mg via ORAL
  Filled 2016-06-14 (×7): qty 1

## 2016-06-14 NOTE — Progress Notes (Signed)
ANTICOAGULATION CONSULT NOTE  Pharmacy Consult for heparin Indication: DVT  No Known Allergies  Patient Measurements: Height: 5\' 7"  (170.2 cm) Weight: 170 lb (77.1 kg) IBW/kg (Calculated) : 61.6 Heparin Dosing Weight: 77 kg  Vital Signs:    Labs:  Recent Labs  06/12/16 0632 06/12/16 1153 06/12/16 1949 06/13/16 0245 06/14/16 0310  HGB 8.4*  --   --  7.9* 8.5*  HCT 25.1*  --   --  24.1* 26.2*  PLT 169  --   --  171 267  LABPROT  --  14.4  --   --   --   INR  --  1.11  --   --   --   HEPARINUNFRC  --   --  0.54 0.58 0.71*  CREATININE 4.10*  --   --  2.92* 2.05*  CKTOTAL 238*  --   --  129 75     Medical History: Past Medical History:  Diagnosis Date  . Hypertension   . Stroke [facility])      Assessment: 56 yo female admitted with lower extremity pain after falling a couple days ago. She is wheelchair bound at baseline. 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 Heparin level 0.71, slightly above goal this morning, on 1100 units/hr. CBC stable, No bleeding noted per chart.  Goal of Therapy:  Heparin level 0.3-0.7 units/ml Monitor platelets by anticoagulation protocol: Yes    Plan:  -Decrease heparin slightly to 1000 units/hr -Daily HL, CBC -F/u plan for oral anticoagulation  Maryanna Shape, PharmD, BCPS  Clinical Pharmacist  Pager: [phone]   06/14/2016,8:34 AM

## 2016-06-14 NOTE — Progress Notes (Signed)
PT Cancellation Note  Patient Details Name: Ann Lynch MRN: [id number] DOB: 08/28/1960   Cancelled Treatment:    Reason Eval/Treat Not Completed: Other (comment). Fatigue after just finishing with OT. Will try again tomorrow.   [place] 06/14/2016, 1:48 PM [place]

## 2016-06-14 NOTE — Progress Notes (Addendum)
Rehab Admissions Coordinator Note:  Patient was screened by Cleatrice Burke for appropriateness for an Inpatient Acute Rehab Consult per OT recommendation. PT eval pending.   At this time, we are recommending inpt rehab consult if pt would like to be considered for admit.Cleatrice Burke 06/14/2016, 2:25 PM  I can be reached at [phone].

## 2016-06-14 NOTE — Evaluation (Signed)
Occupational Therapy Evaluation Patient Details Name: Ann Lynch MRN: [id number] DOB: [birth year] Today's Date: 06/14/2016    History of Present Illness Ann Lynch is a 56 y.o. female with medical history significant of hypertension, CVA with right-sided hemiplegia who was brought to the emergency department with left lower extremity pain after having a fall 2 days ago, followed by drowsiness since day before admit. Found to have Bil LE DVTs   Clinical Impression   This 56 yo female admitted and found to have above presents to acute OT with deficits below (see OT problem list) thus affecting her PLOF of being totally Mod Independent with basic ADLs (except for LB clothing management in standing). She will benefit from acute OT with follow up OT on CIR to get back to PLOF to return home with family.     Follow Up Recommendations  CIR;Supervision/Assistance - 24 hour    Equipment Recommendations  Hospital bed;Other (comment) (family reports theirs is really old)       Precautions / Restrictions Precautions Precautions: Fall Restrictions Weight Bearing Restrictions: No      Mobility Bed Mobility Overal bed mobility: Needs Assistance Bed Mobility: Supine to Sit;Sit to Supine;Rolling Rolling: Mod assist (use or rail)   Supine to sit: Mod assist;HOB elevated (and use of rail. Pt "hooks" RLE with LLE to A it off of bed) Sit to supine: Mod assist (Need A for RLE and trunk)      Transfers                      Balance Overall balance assessment: Needs assistance Sitting-balance support: No upper extremity supported;Feet supported Sitting balance-Leahy Scale: Fair                                     ADL either performed or assessed with clinical judgement   ADL Overall ADL's : Needs assistance/impaired Eating/Feeding: Set up (supported sitting)     Grooming Details (indicate cue type and reason): setup/S for all but deodorant (mod  A) and hair total A Upper Body Bathing: Minimal assistance (supported sitting)   Lower Body Bathing: Moderate assistance Lower Body Bathing Details (indicate cue type and reason): supported sit/lateral leans Upper Body Dressing : Minimal assistance Upper Body Dressing Details (indicate cue type and reason): supported sitting Lower Body Dressing: Moderate assistance Lower Body Dressing Details (indicate cue type and reason): supported sit/lateral leans                     Vision Baseline Vision/History: Wears glasses Wears Glasses: Reading only Patient Visual Report: No change from baseline Additional Comments: decreased tracking, disconjugate gaze (no double vision reported), able to reach for object in all quadrants with head turns            Pertinent Vitals/Pain Pain Assessment: No/denies pain     Hand Dominance Right (but uses left due to old CVA on right)   Extremity/Trunk Assessment Upper Extremity Assessment Upper Extremity Assessment: RUE deficits/detail RUE Deficits / Details: Flaccid, 1 finger subluxation   Lower Extremity Assessment Lower Extremity Assessment: Defer to PT evaluation       Communication Communication Communication: No difficulties   Cognition Arousal/Alertness: Awake/alert Behavior During Therapy: Flat affect Overall Cognitive Status: Within Functional Limits for tasks assessed (for tasks assessed today)  Home Living Family/patient expects to be discharged to:: Inpatient rehab Living Arrangements: Parent Available Help at Discharge: Available 24 hours/day;Family Type of Home: House Home Access: Ramped entrance     Home Layout: One level     Bathroom Shower/Tub: Tub/shower unit;Curtain   Biochemist, clinical: Standard     Home Equipment: Environmental consultant - 2 wheels;Bedside commode;Wheelchair - Education officer, community - power;Hospital bed;Hand held shower head          Prior  Functioning/Environment Level of Independence: Needs assistance  Gait / Transfers Assistance Needed: non ambulatory only doing stand pivots only ADL's / Homemaking Assistance Needed: Feeds self, does dressing except cannot stand and pull up clothing at same time, can bath self once A'd into tub; can toilet except cannot stand and doing clothing management at same time            OT Problem List: Decreased strength;Decreased range of motion;Impaired balance (sitting and/or standing);Impaired vision/perception;Impaired UE functional use;Impaired tone      OT Treatment/Interventions: Self-care/ADL training;Balance training;Therapeutic activities;DME and/or AE instruction;Visual/perceptual remediation/compensation;Patient/family education    OT Goals(Current goals can be found in the care plan section) Acute Rehab OT Goals Patient Stated Goal: to be able to move around again as pta OT Goal Formulation: With patient/family Time For Goal Achievement: 06/28/16 Potential to Achieve Goals: Good  OT Frequency: Min 3X/week              AM-PAC PT "6 Clicks" Daily Activity     Outcome Measure Help from another person eating meals?: A Little Help from another person taking care of personal grooming?: A Little Help from another person toileting, which includes using toliet, bedpan, or urinal?: A Lot Help from another person bathing (including washing, rinsing, drying)?: A Lot Help from another person to put on and taking off regular upper body clothing?: A Lot Help from another person to put on and taking off regular lower body clothing?: A Lot 6 Click Score: 14   End of Session Nurse Communication:  (NT: external female catheter needed to be re-applied)  Activity Tolerance: Patient tolerated treatment well Patient left: in bed;with call bell/phone within reach;with family/visitor present (NT in room)  OT Visit Diagnosis: Other abnormalities of gait and mobility (R26.89);Muscle weakness  (generalized) (M62.81);Other symptoms and signs involving the nervous system (R29.898);Hemiplegia and hemiparesis Hemiplegia - Right/Left: Right Hemiplegia - dominant/non-dominant: Dominant Hemiplegia - caused by: Cerebral infarction                Time: 1610-9604 OT Time Calculation (min): 30 min Charges:  OT General Charges $OT Visit: 1 Procedure OT Evaluation $OT Eval Moderate Complexity: 1 Procedure OT Treatments $Therapeutic Activity: 8-22 mins [place], OTR/L [phone] 06/14/2016

## 2016-06-14 NOTE — Progress Notes (Addendum)
[name] PROGRESS NOTE  Ann Lynch KHT:[id number] DOB: 05-Dec-1960 DOA: 06/11/2016   PCP: [name], MD     Assessment/Plan: Principal Problem:   Sepsis due to cellulitis [facility]) Active Problems:   AKI (acute kidney injury) ([place])   CVA, old, hemiparesis ([place])   HTN (hypertension)   56 y.o. female with medical history significant of hypertension, CVA with right-sided hemiplegia who was brought to the emergency department with left lower extremity pain after having a fall 2 days ago, followed by drowsiness since yesterday. Patient presented with altered mental status and left leg pain. Admitted for left leg swelling, acute kidney injury,  Assessment and plan Left leg pain,doubt cellulitis or sepsis  Continue telemetry, admission lactic acid within normal limits,   Started on  vancomycin and Zosyn per pharmacy. Antibiotics have not been discontinued Blood culture showed gram-positive rods, essentially contaminant Leukocytosis   improving Primary issue seems to be extensive DVT      AKI (acute kidney injury) ([place]) presented with a creatinine of 5.22>now 2.05 Hold Micardis HCT. Hyponatremia likely secondary to HCTZ,resolved  Hold the spironolactone in the setting of dehydration. Renal ultrasound shows medical renal disease without any hydronephrosis Renal function is improving,bolus today due to low BP Creatinine improving slowly, baseline unknown but probably within normal range based on 06/10/13 CK normal today, IV fluids discontinued    CVA, old, hemiparesis ([place]) Supportive care. Continue prophylactic phenytoin 100 mg by mouth 3 times a day. Check phenytoin level.    HTN (hypertension) Continue metoprolol 100 mg by mouth twice a day. Reduce  clonidine 0.2 mg by mouth 2 times a day Monitor blood pressure. Bolus 500 cc as BP soft  Extensive pelvic and B/L LE DVT's  Left - Positive for acute DVT and age indeterminate DVT in the iliac, common  femoral, femoral, and popliteal veins. There is also superficial thromobosis of the saphenofemoral junction and the proximal greater saphenous vein. Right - Positive for acute DVT and age indeterminate DVT of the iliac, common femoral, femoral, and popliteal veins  continue heparin drip, day #2 Not a candidate for thrombolysis due to renal insufficiency, per vascular surgery Patient would need outpatient age-appropriate screening for malignancy to be arranged for by PCP     DVT prophylaxsis heparin gtt  Code Status:  Full code    Family Communication: Discussed in detail with the patient's mother and sister by the bedside , all imaging results, lab results explained to the patient   Disposition Plan:  SNF in 1-2 days      Consultants:  Vascular  Procedures:  None  Antibiotics: Anti-infectives    Start     Dose/Rate Route Frequency Ordered Stop   06/13/16 2100  vancomycin (VANCOCIN) IVPB 1000 mg/200 mL premix  Status:  Discontinued     1,000 mg 200 mL/hr over 60 Minutes Intravenous Every 48 hours 06/12/16 0341 06/13/16 0918   06/12/16 0600  piperacillin-tazobactam (ZOSYN) IVPB 2.25 g  Status:  Discontinued     2.25 g 100 mL/hr over 30 Minutes Intravenous Every 8 hours 06/12/16 0137 06/13/16 0918   06/12/16 0000  vancomycin (VANCOCIN) IVPB 1000 mg/200 mL premix     1,000 mg 200 mL/hr over 60 Minutes Intravenous  Once 06/11/16 2258 06/12/16 0131   06/11/16 1745  piperacillin-tazobactam (ZOSYN) IVPB 3.375 g     3.375 g 100 mL/hr over 30 Minutes Intravenous  Once 06/11/16 1741 06/11/16 2034  HPI/Subjective: Mother by bedside , patient sleepy ,had breakfast ,appetitie is improving   Objective: Vitals:   06/13/16 0953 06/13/16 0954 06/13/16 1205 06/13/16 2031  BP:  (!) 156/98 138/88 105/66  Pulse: (!) 106  95 (!) 104  Resp:   18 18  Temp:   99.3 F (37.4 C) 99.8 F (37.7 C)  TempSrc:   Oral Oral  SpO2:   99% 97%  Weight:      Height:         Intake/Output Summary (Last 24 hours) at 06/14/16 0844 Last data filed at 06/13/16 1836  Gross per 24 hour  Intake            618.6 ml  Output                0 ml  Net            618.6 ml    Exam:  Cardiovascular: Regular rate and rhythm, no murmurs / rubs / gallops. No extremity edema. 2+ pedal pulses. No carotid bruits.  Abdomen: Soft, no tenderness, no masses palpated. No hepatosplenomegaly. Bowel sounds positive.  Musculoskeletal:  swelling of the left lower extremity Skin no rashes Neurologic: CN 2-12 grossly intact. Sensation intact, Right hemiplegia.     Data Reviewed: I have personally reviewed following labs and imaging studies  Micro Results Recent Results (from the past 240 hour(s))  Culture, blood (routine x 2)     Status: None (Preliminary result)   Collection Time: 06/11/16  5:19 PM  Result Value Ref Range Status   Specimen Description BLOOD LEFT ANTECUBITAL  Final   Special Requests   Final    BOTTLES DRAWN AEROBIC AND ANAEROBIC Blood Culture adequate volume   Culture NO GROWTH 2 DAYS  Final   Report Status PENDING  Incomplete  Culture, blood (routine x 2)     Status: None (Preliminary result)   Collection Time: 06/11/16  5:24 PM  Result Value Ref Range Status   Specimen Description BLOOD LEFT FOREARM  Final   Special Requests   Final    BOTTLES DRAWN AEROBIC AND ANAEROBIC Blood Culture results may not be optimal due to an inadequate volume of blood received in culture bottles   Culture  Setup Time   Final    GRAM POSITIVE RODS AEROBIC BOTTLE ONLY CRITICAL RESULT CALLED TO, READ BACK BY AND VERIFIED WITH: E MARTIN,PHARMD AT 1800 06/13/16 BY L BENFIELD    Culture GRAM POSITIVE RODS  Final   Report Status PENDING  Incomplete  Blood Culture ID Panel (Reflexed)     Status: None   Collection Time: 06/11/16  5:24 PM  Result Value Ref Range Status   Enterococcus species NOT DETECTED NOT DETECTED Final   Listeria monocytogenes NOT DETECTED NOT DETECTED Final    Staphylococcus species NOT DETECTED NOT DETECTED Final   Staphylococcus aureus NOT DETECTED NOT DETECTED Final   Streptococcus species NOT DETECTED NOT DETECTED Final   Streptococcus agalactiae NOT DETECTED NOT DETECTED Final   Streptococcus pneumoniae NOT DETECTED NOT DETECTED Final   Streptococcus pyogenes NOT DETECTED NOT DETECTED Final   Acinetobacter baumannii NOT DETECTED NOT DETECTED Final   Enterobacteriaceae species NOT DETECTED NOT DETECTED Final   Enterobacter cloacae complex NOT DETECTED NOT DETECTED Final   Escherichia coli NOT DETECTED NOT DETECTED Final   Klebsiella oxytoca NOT DETECTED NOT DETECTED Final   Klebsiella pneumoniae NOT DETECTED NOT DETECTED Final   Proteus species NOT DETECTED NOT DETECTED Final   Serratia marcescens  NOT DETECTED NOT DETECTED Final   Haemophilus influenzae NOT DETECTED NOT DETECTED Final   Neisseria meningitidis NOT DETECTED NOT DETECTED Final   Pseudomonas aeruginosa NOT DETECTED NOT DETECTED Final   Candida albicans NOT DETECTED NOT DETECTED Final   Candida glabrata NOT DETECTED NOT DETECTED Final   Candida krusei NOT DETECTED NOT DETECTED Final   Candida parapsilosis NOT DETECTED NOT DETECTED Final   Candida tropicalis NOT DETECTED NOT DETECTED Final  Urine culture     Status: None   Collection Time: 06/11/16  9:07 PM  Result Value Ref Range Status   Specimen Description URINE, CLEAN CATCH  Final   Special Requests NONE  Final   Culture NO GROWTH  Final   Report Status 06/13/2016 FINAL  Final    Radiology Reports Dg Pelvis 1-2 Views  Result Date: 06/11/2016 CLINICAL DATA:  Recent fall with pelvic pain, initial encounter EXAM: PELVIS - 1-2 VIEW COMPARISON:  None. FINDINGS: Pelvic ring appears intact. The proximal right femur is within normal limits. Some linear bony densities are noted superimposed over the right hip joint likely related to dystrophic bone formation although the possibility of a pelvic fracture in the posterior  aspect of the right acetabulum could not be excluded. CT would be helpful for further evaluation. IMPRESSION: Irregular bony density overlying the right hip joint which is likely chronic although the possibility of an acute acetabular fracture deserves consideration. CT of the pelvis is recommended for further evaluation. Electronically Signed   By: [name] M.D.   On: 06/11/2016 18:30   Dg Knee 1-2 Views Left  Result Date: 06/11/2016 CLINICAL DATA:  Altered mental status and recent fall with left knee pain, initial encounter EXAM: LEFT KNEE - 2 VIEW COMPARISON:  None. FINDINGS: No evidence of fracture, dislocation, or joint effusion. No evidence of arthropathy or other focal bone abnormality. Soft tissues are unremarkable. IMPRESSION: No acute abnormality noted. Electronically Signed   By: [name] M.D.   On: 06/11/2016 18:31   Ct Head Wo Contrast  Result Date: 06/11/2016 CLINICAL DATA:  Altered mental status. EXAM: CT HEAD WITHOUT CONTRAST TECHNIQUE: Contiguous axial images were obtained from the base of the skull through the vertex without intravenous contrast. COMPARISON:  Head CT 06/08/2013 and MRI 06/09/2013 FINDINGS: Brain: A chronic left thalamic infarct is again seen with associated ex vacuo dilatation of the left lateral ventricle and wallerian degeneration along the corticospinal tract. Confluent gliosis extending superiorly into the centrum semiovale is unchanged. Chronic small vessel ischemic changes in the right cerebral white matter are also similar to the prior studies. No acute infarct, acute intracranial hemorrhage, mass, or extra-axial fluid collection is seen. Vascular: Calcified atherosclerosis at the skullbase, age advanced. No hyperdense vessel. Skull: No fracture or focal osseous lesion. Sinuses/Orbits: Paranasal sinuses and mastoid air cells are clear. Unremarkable orbits. Other: None. IMPRESSION: 1. No evidence of acute intracranial abnormality. 2. Chronic left thalamic  infarct and chronic small vessel ischemic disease. Electronically Signed   By: Logan Bores M.D.   On: 06/11/2016 20:56   US Renal  Result Date: 06/12/2016 CLINICAL DATA:  Acute kidney injury, hypertension EXAM: RENAL / URINARY TRACT ULTRASOUND COMPLETE COMPARISON:  06/08/2013 FINDINGS: Right Kidney: Length: 10.2 cm. Normal cortical thickness. Increased cortical echogenicity. No mass, hydronephrosis or shadowing calcification. Left Kidney: Length: 10.8 cm. Normal cortical thickness. Increased cortical echogenicity. No mass, hydronephrosis or shadowing calcification. Small amount of perinephric fluid at inferior pole. Bladder: Appears normal for degree of bladder distention. Incidentally noted  minimal ascites adjacent to liver. IMPRESSION: Medical renal disease changes of both kidneys without mass or hydronephrosis. Minimal ascites. Electronically Signed   By: Lavonia Dana M.D.   On: 06/12/2016 10:06   Ct Pelvis Limited Wo Contrast  Result Date: 06/12/2016 CLINICAL DATA:  Left thigh pain after recent fall. Abnormal radiographs. EXAM: CT PELVIS WITHOUT CONTRAST TECHNIQUE: Multidetector CT imaging of the pelvis was performed following the standard protocol without intravenous contrast. COMPARISON:  Radiographs 06/11/2016. FINDINGS: Urinary Tract: Mild diffuse bladder wall thickening without apparent focal abnormality. The distal ureters are not well visualized, but do not appear dilated. Bowel: No bowel wall thickening, distention or focal surrounding inflammation identified in the pelvis. Vascular/Lymphatic: There are asymmetric mildly enlarged left external iliac and inguinal lymph nodes, measuring up to 12 mm short axis. There is edema surrounding the left external iliac and proximal femoral vessels. The left external iliac and femoral veins appear mildly distended with heterogeneous, generally increased density. Appearance is suspicious for DVT. Mild aortoiliac atherosclerosis noted. Reproductive: The uterus  appears unremarkable. No evidence of adnexal mass. Other: Moderate amount of free pelvic fluid. There is nonspecific soft tissue edema throughout the pelvis. Musculoskeletal: No evidence of acute fracture, dislocation or femoral head avascular necrosis. 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 This certainly has the potential to restrict external rotation of the hip. The hip joint spaces are maintained. There are mild sacroiliac degenerative changes bilaterally. There is more advanced facet hypertrophy in the lower lumbar spine, worst on the right at L5-S1. Asymmetric diffuse atrophy of the right hip musculature is noted. IMPRESSION: 1. No acute osseous findings. 2. 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 This may restrict external rotation of the hip and is associated with diffuse right pelvic muscular atrophy. 3. Suspicion of left pelvic and femoral vein DVT. Correlation with venous Doppler recommended. 4. Nonspecific pelvic edema and mild left inguinal adenopathy. 5. These results will be called to the ordering clinician or representative by the Radiologist Assistant, and communication documented in the PACS or zVision Dashboard. Electronically Signed   By: Richardean Sale M.D.   On: 06/12/2016 11:19   Dg Chest Port 1 View  Result Date: 06/11/2016 CLINICAL DATA:  Altered mental status EXAM: PORTABLE CHEST 1 VIEW COMPARISON:  06/08/2013 FINDINGS: Cardiac shadow is mildly enlarged. The lungs are well aerated bilaterally. No focal infiltrate or sizable effusion is seen. Previously seen masslike densities are less well appreciated on the current exam. IMPRESSION: No acute abnormality seen. Electronically Signed   By: [name] M.D.   On: 06/11/2016 18:28   Dg Femur Min 2 Views Left  Result Date: 06/11/2016 CLINICAL DATA:  Left leg pain following recent fall EXAM: LEFT FEMUR 2 VIEWS  COMPARISON:  None. FINDINGS: Degenerative changes of left hip joint are seen. No acute fracture or dislocation is noted. Vascular calcifications are seen. No soft tissue abnormality is noted. IMPRESSION: Degenerative change without acute abnormality. Electronically Signed   By: [name] M.D.   On: 06/11/2016 18:29     CBC  Recent Labs Lab 06/11/16 1703 06/12/16 0632 06/13/16 0245 06/14/16 0310  WBC 13.2* 12.0* 10.0 11.2*  HGB 8.7* 8.4* 7.9* 8.5*  HCT 26.8* 25.1* 24.1* 26.2*  PLT 171 169 171 267  MCV 90.8 90.3 89.9 90.7  MCH 29.5 30.2 29.5 29.4  MCHC 32.5 33.5 32.8 32.4  RDW 13.5 13.5 13.6 13.8  LYMPHSABS 1.1 0.7 0.8 1.5  MONOABS 1.2* 1.4* 1.5* 1.6*  EOSABS 0.1 0.1 0.1 0.2  BASOSABS 0.0 0.0 0.0 0.0    Chemistries   Recent Labs Lab 06/11/16 1703 06/12/16 0632 06/13/16 0245 06/14/16 0310  NA 131* 130* 136 135  K 3.8 3.2* 4.5 4.2  CL 94* 97* 105 103  CO2 23 20* 22 22  GLUCOSE 131* 165* 129* 119*  BUN 63* 57* 43* 34*  CREATININE 5.22* 4.10* 2.92* 2.05*  CALCIUM 8.9 8.2* 8.1* 8.5*  MG  --  2.2  --   --   AST 114* 108* 96* 89*  ALT 51 48 46 43  ALKPHOS 111 106 113 122  BILITOT 1.3* 1.5* 1.4* 1.3*   ------------------------------------------------------------------------------------------------------------------ estimated creatinine clearance is 34 mL/min (A) (by C-G formula based on SCr of 2.05 mg/dL (H)). ------------------------------------------------------------------------------------------------------------------ No results for input(s): HGBA1C in the last 72 hours. ------------------------------------------------------------------------------------------------------------------ No results for input(s): CHOL, HDL, LDLCALC, TRIG, CHOLHDL, LDLDIRECT in the last 72 hours. ------------------------------------------------------------------------------------------------------------------  Recent Labs  06/12/16 1125  TSH 0.613    ------------------------------------------------------------------------------------------------------------------ No results for input(s): VITAMINB12, FOLATE, FERRITIN, TIBC, IRON, RETICCTPCT in the last 72 hours.  Coagulation profile  Recent Labs Lab 06/12/16 1153  INR 1.11    No results for input(s): DDIMER in the last 72 hours.  Cardiac Enzymes No results for input(s): CKMB, TROPONINI, MYOGLOBIN in the last 168 hours.  Invalid input(s): CK ------------------------------------------------------------------------------------------------------------------ Invalid input(s): POCBNP   CBG: No results for input(s): GLUCAP in the last 168 hours.     Studies: US Renal  Result Date: 06/12/2016 CLINICAL DATA:  Acute kidney injury, hypertension EXAM: RENAL / URINARY TRACT ULTRASOUND COMPLETE COMPARISON:  06/08/2013 FINDINGS: Right Kidney: Length: 10.2 cm. Normal cortical thickness. Increased cortical echogenicity. No mass, hydronephrosis or shadowing calcification. Left Kidney: Length: 10.8 cm. Normal cortical thickness. Increased cortical echogenicity. No mass, hydronephrosis or shadowing calcification. Small amount of perinephric fluid at inferior pole. Bladder: Appears normal for degree of bladder distention. Incidentally noted minimal ascites adjacent to liver. IMPRESSION: Medical renal disease changes of both kidneys without mass or hydronephrosis. Minimal ascites. Electronically Signed   By: Lavonia Dana M.D.   On: 06/12/2016 10:06      Lab Results  Component Value Date   HGBA1C 5.3 06/09/2013   Lab Results  Component Value Date   LDLCALC 131 (H) 06/09/2013   CREATININE 2.05 (H) 06/14/2016       Scheduled Meds: . amitriptyline  25 mg Oral QHS  . aspirin EC  81 mg Oral Daily  . atorvastatin  40 mg Oral QHS  . cloNIDine  0.2 mg Oral QID  . DULoxetine  20 mg Oral QHS  . feeding supplement (NEPRO CARB STEADY)  237 mL Oral BID BM  . folic acid  1 mg Oral Daily  .  gabapentin  400 mg Oral TID  . metoprolol tartrate  100 mg Oral BID  . phenytoin  100 mg Oral TID   Continuous Infusions: . heparin 1,100 Units/hr (06/13/16 0804)  . sodium chloride       LOS: 3 days    Time spent: >30 MINS    Reyne Dumas  [facility] Pager [phone]. If 7PM-7AM, please contact night-coverage at [email or web address], password [facility] 06/14/2016, 8:44 AM  LOS: 3 days

## 2016-06-14 NOTE — Progress Notes (Signed)
Nutrition Brief Note  Patient identified on the Malnutrition Screening Tool (MST) Report.  Wt Readings from Last 15 Encounters:  06/12/16 170 lb (77.1 kg)   Body mass index is 26.63 kg/m. Patient meets criteria for Overweight based on current BMI.   Current diet order is Renal with 1200 ml fluid restriction, patient is consuming approximately 75% of meals at this time. Labs and medications reviewed.   No nutrition interventions warranted at this time. If nutrition issues arise, please consult RD.   Arthur Holms, RD, LDN Pager #: [phone] After-Hours Pager #: [phone]

## 2016-06-15 ENCOUNTER — Inpatient Hospital Stay (HOSPITAL_COMMUNITY): Payer: Medicare HMO

## 2016-06-15 DIAGNOSIS — A419 Sepsis, unspecified organism: Principal | ICD-10-CM

## 2016-06-15 DIAGNOSIS — L0390 Cellulitis, unspecified: Secondary | ICD-10-CM

## 2016-06-15 LAB — COMPREHENSIVE METABOLIC PANEL
ALT: 43 U/L (ref 14–54)
ANION GAP: 8 (ref 5–15)
AST: 95 U/L — ABNORMAL HIGH (ref 15–41)
Albumin: 2 g/dL — ABNORMAL LOW (ref 3.5–5.0)
Alkaline Phosphatase: 139 U/L — ABNORMAL HIGH (ref 38–126)
BILIRUBIN TOTAL: 2 mg/dL — AB (ref 0.3–1.2)
BUN: 25 mg/dL — AB (ref 6–20)
CHLORIDE: 102 mmol/L (ref 101–111)
CO2: 24 mmol/L (ref 22–32)
Calcium: 8.6 mg/dL — ABNORMAL LOW (ref 8.9–10.3)
Creatinine, Ser: 1.51 mg/dL — ABNORMAL HIGH (ref 0.44–1.00)
GFR, EST AFRICAN AMERICAN: 44 mL/min — AB (ref >60)
GFR, EST NON AFRICAN AMERICAN: 38 mL/min — AB (ref >60)
Glucose, Bld: 134 mg/dL — ABNORMAL HIGH (ref 65–99)
POTASSIUM: 3.6 mmol/L (ref 3.5–5.1)
Sodium: 134 mmol/L — ABNORMAL LOW (ref 135–145)
TOTAL PROTEIN: 7 g/dL (ref 6.5–8.1)

## 2016-06-15 LAB — CBC
HEMATOCRIT: 24.3 % — AB (ref 36.0–46.0)
Hemoglobin: 8.1 g/dL — ABNORMAL LOW (ref 12.0–15.0)
MCH: 30.3 pg (ref 26.0–34.0)
MCHC: 33.3 g/dL (ref 30.0–36.0)
MCV: 91 fL (ref 78.0–100.0)
PLATELETS: 208 10*3/uL (ref 150–400)
RBC: 2.67 MIL/uL — ABNORMAL LOW (ref 3.87–5.11)
RDW: 14.1 % (ref 11.5–15.5)
WBC: 11.7 10*3/uL — ABNORMAL HIGH (ref 4.0–10.5)

## 2016-06-15 LAB — HEPARIN LEVEL (UNFRACTIONATED): Heparin Unfractionated: 0.55 IU/mL (ref 0.30–0.70)

## 2016-06-15 LAB — GLUCOSE, CAPILLARY: GLUCOSE-CAPILLARY: 110 mg/dL — AB (ref 65–99)

## 2016-06-15 MED ORDER — WARFARIN - PHARMACIST DOSING INPATIENT
Freq: Every day | Status: DC
  Administered 2016-06-15 – 2016-06-16 (×2)

## 2016-06-15 MED ORDER — ENOXAPARIN SODIUM 120 MG/0.8ML ~~LOC~~ SOLN
120.0000 mg | SUBCUTANEOUS | Status: DC
  Administered 2016-06-15 – 2016-06-16 (×2): 120 mg via SUBCUTANEOUS
  Filled 2016-06-15 (×2): qty 0.8

## 2016-06-15 MED ORDER — WARFARIN SODIUM 7.5 MG PO TABS
7.5000 mg | ORAL_TABLET | Freq: Once | ORAL | Status: AC
  Administered 2016-06-15: 7.5 mg via ORAL
  Filled 2016-06-15: qty 1

## 2016-06-15 NOTE — Progress Notes (Signed)
Occupational Therapy Treatment Patient Details Name: Ann Lynch MRN: [id number] DOB: 05-Jan-1961 Today's Date: 06/15/2016    History of present illness Ann Lynch is a 56 y.o. female with medical history significant of hypertension, CVA with right-sided hemiplegia who was brought to the emergency department with left lower extremity pain after having a fall 2 days ago, followed by drowsiness since day before admit. Found to have Bil LE DVTs   OT comments  This 56 yo female admitted with above, making progress towards transfers and basic ADLs. She will continue to benefit from acute OT with follow up on CIR to get back to PLOF of Mod Independent (except for LB clothing manipulation--under/pants up/down).  Follow Up Recommendations  CIR;Supervision/Assistance - 24 hour    Equipment Recommendations  Hospital bed;Other (comment) (family reports there's is really old)       Precautions / Restrictions Precautions Precautions: Fall Restrictions Weight Bearing Restrictions: No       Mobility Bed Mobility Overal bed mobility: Needs Assistance Bed Mobility: Supine to Sit     Supine to sit: Min guard;HOB elevated     General bed mobility comments: use of rail and coming out on left side of bed, increased time, momentum, and hooking her RLE with her LLE  Transfers Overall transfer level: Needs assistance Equipment used: 1 person hand held assist Transfers: Sit to/from [employer] Sit to Stand: Min assist;Mod assist   Squat pivot transfers: Min assist (from bed to recliner going to her left)     General transfer comment: For sit<>stand; 4 times. Min A from bed, min A from recliner with chair in front of her; Mod A from recliner holding onto sink on her left side and sink in front of her. Pt with tendency with posterior lean and RLE going into extension with sit>stand (verbal cues to lean more foreward and A to block RLE)--per mother with sit>stand pt  usually has her RLE behind her LLE with sit to stand    Balance Overall balance assessment: Needs assistance Sitting-balance support: No upper extremity supported;Feet supported Sitting balance-Leahy Scale: Fair   Postural control: Posterior lean Standing balance support: Single extremity supported Standing balance-Leahy Scale: Poor Standing balance comment: reliant on LUE support and A                           ADL either performed or assessed with clinical judgement   ADL Overall ADL's : Needs assistance/impaired Eating/Feeding: Set up (supported sitting)   Grooming: Wash/dry hands;Wash/dry face;Set up Grooming Details (indicate cue type and reason): supported sitting               Lower Body Dressing Details (indicate cue type and reason): S to doff socks, min guard A to don socks; min A for sit>stand and total A to pull up/down clothing Toilet Transfer: Minimal assistance;Squat-pivot Toilet Transfer Details (indicate cue type and reason): bed>recliner going to left                 Vision Baseline Vision/History: Wears glasses Wears Glasses: Reading only Patient Visual Report: No change from baseline            Cognition Arousal/Alertness: Awake/alert Behavior During Therapy: Flat affect                                   General Comments: Some of the information  she gave [place] was not what her mother gave [place] as far as her PLOF with transfers and clothing management                   Pertinent Vitals/ Pain       Pain Assessment: No/denies pain         Frequency  Min 3X/week        Progress Toward Goals  OT Goals(current goals can now be found in the care plan section)  Progress towards OT goals: Progressing toward goals     Plan Discharge plan remains appropriate    Co-evaluation    PT/OT/SLP Co-Evaluation/Treatment: Yes (partial) Reason for Co-Treatment: For patient/therapist safety;To address functional/ADL  transfers          AM-PAC PT "6 Clicks" Daily Activity     Outcome Measure   Help from another person eating meals?: A Little Help from another person taking care of personal grooming?: A Little Help from another person toileting, which includes using toliet, bedpan, or urinal?: A Lot Help from another person bathing (including washing, rinsing, drying)?: A Little Help from another person to put on and taking off regular upper body clothing?: A Little Help from another person to put on and taking off regular lower body clothing?: A Little 6 Click Score: 17    End of Session Equipment Utilized During Treatment: Gait belt  OT Visit Diagnosis: Other abnormalities of gait and mobility (R26.89);Muscle weakness (generalized) (M62.81);Other symptoms and signs involving the nervous system (R29.898);Hemiplegia and hemiparesis Hemiplegia - Right/Left: Right Hemiplegia - dominant/non-dominant: Dominant Hemiplegia - caused by: Cerebral infarction   Activity Tolerance Patient tolerated treatment well   Patient Left in chair;with call bell/phone within reach;with chair alarm set   Nurse Communication Mobility status        Time: 6948-5462 OT Time Calculation (min): 39 min  Charges: OT General Charges $OT Visit: 1 Procedure OT Treatments $Self Care/Home Management : 23-37 mins  [address], OTR/L [phone] 06/15/2016

## 2016-06-15 NOTE — Progress Notes (Signed)
ANTICOAGULATION CONSULT NOTE  Pharmacy Consult for heparin Indication: DVT  No Known Allergies  Patient Measurements: Height: 5\' 7"  (170.2 cm) Weight: 170 lb (77.1 kg) IBW/kg (Calculated) : 61.6 Heparin Dosing Weight: 77 kg  Vital Signs: Temp: 98.9 F (37.2 C) (05/29 0300) Temp Source: Oral (05/29 0300) BP: 146/98 (05/29 0300) Pulse Rate: 105 (05/29 0300)  Labs:  Recent Labs  06/12/16 1153  06/13/16 0245 06/14/16 0310 06/15/16 0340  HGB  --   < > 7.9* 8.5* 8.1*  HCT  --   --  24.1* 26.2* 24.3*  PLT  --   --  171 267 208  LABPROT 14.4  --   --   --   --   INR 1.11  --   --   --   --   HEPARINUNFRC  --   < > 0.58 0.71* 0.55  CREATININE  --   --  2.92* 2.05* 1.51*  CKTOTAL  --   --  129 75  --   < > = values in this interval not displayed.   Medical History: Past Medical History:  Diagnosis Date  . Hypertension   . Stroke [facility])      Assessment: 56 yo female admitted with lower extremity pain after falling a couple days ago. She is wheelchair bound at baseline. 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 The heparin level is noted at goal -Phenytoin is a strong CYP3A4 and P-gp inducer and should not be used with DOACs (although there is little data with edoxaban)   Goal of Therapy:  Heparin level 0.3-0.7 units/ml Monitor platelets by anticoagulation protocol: Yes    Plan:  -No heparin changes needed -Daily heparin level and CBC -Consider starting warfarin?  Hildred Laser, Pharm D 06/15/2016 10:59 AM

## 2016-06-15 NOTE — Evaluation (Signed)
Physical Therapy Evaluation Patient Details Name: Ann Lynch MRN: [id number] DOB: [birth year] Today's Date: 06/15/2016   History of Present Illness  Ann Lynch is a 56 y.o. female with medical history significant of hypertension, CVA with right-sided hemiplegia who was brought to the emergency department with left lower extremity pain after having a fall 2 days ago, followed by drowsiness since day before admit. Found to have Bil LE DVTs  Clinical Impression  Pt pleasant and very willing to participate. Pt at times has difficulty recalling the specifics of her mobility and how much assist she required at home. Per mom pt was able to perform standing and pivot transfers holding onto bathroom bar or chair until approximately 5/16 when she began to require assist. Mom prefers pt be able to return to her baseline functional status to decrease burden of care on aging mother. Pt with decreased strength, balance, transfers and cognition who will benefit from acute therapy as well as CIR to return to minguard functional level with mobility and transfers to return to PLOF and decrease burden of care.     Follow Up Recommendations CIR;Supervision/Assistance - 24 hour    Equipment Recommendations  3in1 (PT);Hospital bed    Recommendations for Other Services       Precautions / Restrictions Precautions Precautions: Fall Restrictions Weight Bearing Restrictions: No      Mobility  Bed Mobility Overal bed mobility: Needs Assistance Bed Mobility: Supine to Sit     Supine to sit: Min guard;HOB elevated     General bed mobility comments: use of rail and coming out on left side of bed, increased time, momentum, and hooking her RLE with her LLE. pt able to adjust pelvis and problem solve with transfer to perform on her own. She transfers to left at home  Transfers Overall transfer level: Needs assistance Equipment used: 1 person hand held assist Transfers: Sit to/from  [employer] Sit to Stand: Min assist;Mod assist   Squat pivot transfers: Min assist     General transfer comment: For sit<>stand; 4 times. Min A from bed, min A from recliner with chair in front of her; Mod A from recliner holding onto sink on her left side and sink in front of her. Pt with tendency with posterior lean and right knee going into extension with sit>stand (verbal cues to lean more foreward and A to block RLE)--per mother with sit>stand pt usually has her RLE behind her LLE with sit to stand  Ambulation/Gait                Stairs            Wheelchair Mobility    Modified Rankin (Stroke Patients Only)       Balance Overall balance assessment: Needs assistance Sitting-balance support: No upper extremity supported;Feet supported Sitting balance-Leahy Scale: Fair   Postural control: Posterior lean Standing balance support: Single extremity supported Standing balance-Leahy Scale: Poor Standing balance comment: reliant on LUE support and A                             Pertinent Vitals/Pain Pain Assessment: No/denies pain    Home Living Family/patient expects to be discharged to:: Inpatient rehab Living Arrangements: Parent Available Help at Discharge: Available 24 hours/day;Family Type of Home: House Home Access: Ramped entrance     Home Layout: One level Home Equipment: Walker - 2 wheels;Bedside commode;Wheelchair - Education officer, community - power;Hospital bed;Hand held shower head  Additional Comments: per mom the [facility] and hospital bed need to be replaced due to poor condition from wear    Prior Function Level of Independence: Needs assistance   Gait / Transfers Assistance Needed: non ambulatory only performs stand pivots to the left other than from toilet where she transfers to the right as well  ADL's / Homemaking Assistance Needed: Feeds self, does dressing except cannot stand and pull up clothing at same time, can bath self  once A'd into tub; can toilet with assist for pants  Comments: pt was transferring on her own until approximately 10 days prior to admission when she required help from mom due to weakness and pain.      Hand Dominance        Extremity/Trunk Assessment   Upper Extremity Assessment Upper Extremity Assessment: Defer to OT evaluation    Lower Extremity Assessment Lower Extremity Assessment: RLE deficits/detail;Generalized weakness RLE Deficits / Details: baseline hemiplegia    Cervical / Trunk Assessment Cervical / Trunk Assessment: Kyphotic  Communication   Communication: Other (comment) (mumbled speech)  Cognition Arousal/Alertness: Awake/alert Behavior During Therapy: Flat affect Overall Cognitive Status: Within Functional Limits for tasks assessed                                 General Comments: Some of the information she gave [place] was not what her mother gave [place] as far as her PLOF with transfers and clothing management      General Comments      Exercises     Assessment/Plan    PT Assessment Patient needs continued PT services  PT Problem List Decreased strength;Decreased mobility;Decreased activity tolerance;Decreased balance;Decreased coordination       PT Treatment Interventions DME instruction;Therapeutic activities;Therapeutic exercise;Balance training;Functional mobility training;Neuromuscular re-education;Patient/family education    PT Goals (Current goals can be found in the Care Plan section)  Acute Rehab PT Goals Patient Stated Goal: to be able to return home PT Goal Formulation: With patient Time For Goal Achievement: 06/29/16 Potential to Achieve Goals: Good    Frequency Min 3X/week   Barriers to discharge Decreased caregiver support mom available 24hrs/day but needs pt to return to baseline of performing transfers with minguard assist    Co-evaluation PT/OT/SLP Co-Evaluation/Treatment: Yes (partial) Reason for Co-Treatment:  For patient/therapist safety PT goals addressed during session: Mobility/safety with mobility;Balance         AM-PAC PT "6 Clicks" Daily Activity  Outcome Measure Difficulty turning over in bed (including adjusting bedclothes, sheets and blankets)?: Total Difficulty moving from lying on back to sitting on the side of the bed? : Total Difficulty sitting down on and standing up from a chair with arms (e.g., wheelchair, bedside commode, etc,.)?: Total Help needed moving to and from a bed to chair (including a wheelchair)?: A Little Help needed walking in hospital room?: Total Help needed climbing 3-5 steps with a railing? : Total 6 Click Score: 8    End of Session Equipment Utilized During Treatment: Gait belt Activity Tolerance: Patient tolerated treatment well Patient left: in chair;with call bell/phone within reach;with chair alarm set Nurse Communication: Mobility status;Precautions;Other (comment) (sequence of transfer for pivot to left) PT Visit Diagnosis: Unsteadiness on feet (R26.81);Other abnormalities of gait and mobility (R26.89);Muscle weakness (generalized) (M62.81)    Time: 4128-7867 PT Time Calculation (min) (ACUTE ONLY): 37 min   Charges:   PT Evaluation $PT Eval Moderate Complexity: 1 Procedure  PT G Codes:        Elwyn Reach, PT [phone]   [place] 06/15/2016, 9:45 AM

## 2016-06-15 NOTE — Consult Note (Signed)
Physical Medicine and Rehabilitation Consult Reason for Consult: Decreased functional mobility Referring Physician: [facility]   HPI: Ann Lynch is a 56 y.o. 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 Per chart review patient lives with mother and essentially wheelchair bound however she was able to perform stand pivot transfers with assistance. Presented 06/11/2016 with left lower extremity pain after recent fall. CT of the head negative for acute changes. CT of the pelvis showed suspicion of left pelvic and femoral vein DVT. Bilateral lower extremity Doppler showed left- positive acute DVT indeterminate age in the iliac, common femoral, femoral and popliteal veins. Also superficial thrombosis of the saphenofemoral junction and the proximal greater saphenous vein. Right positive DVT iliac, common femoral, femoral popliteal veins. Placed on intravenous heparin. Findings of elevated creatinine 5.22 on admission and sodium 1:30. Renal ultrasound negative for hydronephrosis. Latest renal function 2015 within normal limits. Myocarditis was held as well as HCTZ. Vascular surgery follow-up for bilateral DVTs not a candidate for thrombo-lysis due to renal insufficiency. Renal function normalizing 1.51. Physical therapy evaluation completed 06/15/2016 with recommendations of physical medicine rehabilitation consult.   Review of Systems  Constitutional: Positive for malaise/fatigue. Negative for chills and fever.       Right sided weakness  Eyes: Negative for blurred vision and double vision.  Respiratory: Negative for cough and shortness of breath.   Cardiovascular: Positive for leg swelling. Negative for chest pain and palpitations.  Gastrointestinal: Positive for constipation. Negative for nausea and vomiting.  Genitourinary: Negative for dysuria, flank pain, hematuria and urgency.  Musculoskeletal: Positive for joint  pain and myalgias.  Skin: Negative for rash.  Neurological: Positive for speech change and seizures.  All other systems reviewed and are negative.  Past Medical History:  Diagnosis Date  . Hypertension   . Stroke [facility])    Past Surgical History:  Procedure Laterality Date  . TRACHEOSTOMY     Family History  Problem Relation Age of Onset  . Hypertension Mother   . Diabetes Mellitus II Mother   . Hypertension Father    Social History:  reports that she has never smoked. She has never used smokeless tobacco. She reports that she does not drink alcohol. Her drug history is not on file. Allergies: No Known Allergies Medications Prior to Admission  Medication Sig Dispense Refill  . amitriptyline (ELAVIL) 25 MG tablet Take 25 mg by mouth at bedtime.    Marland Kitchen aspirin EC 81 MG tablet Take 81 mg by mouth daily.    Marland Kitchen atorvastatin (LIPITOR) 40 MG tablet Take 40 mg by mouth at bedtime.    . cloNIDine (CATAPRES) 0.3 MG tablet Take 1 tablet (0.3 mg total) by mouth 4 (four) times daily. (Patient taking differently: Take 0.2 mg by mouth 4 (four) times daily. ) 120 tablet 0  . DULoxetine (CYMBALTA) 20 MG capsule Take 20 mg by mouth at bedtime.     . folic acid (FOLVITE) 1 MG tablet Take 1 mg by mouth daily.    Marland Kitchen gabapentin (NEURONTIN) 400 MG capsule Take 400 mg by mouth 3 (three) times daily.    . metoprolol (LOPRESSOR) 100 MG tablet Take 100 mg by mouth 2 (two) times daily.    . phenytoin (DILANTIN) 100 MG ER capsule Take 100 mg by mouth 3 (three) times daily.     Marland Kitchen spironolactone (ALDACTONE) 25 MG tablet Take 25 mg by mouth 3 (three) times daily.    Marland Kitchen  telmisartan-hydrochlorothiazide (MICARDIS HCT) 80-12.5 MG per tablet Take 1 tablet by mouth daily.      Home: Home Living Family/patient expects to be discharged to:: Inpatient rehab Living Arrangements: Parent Available Help at Discharge: Available 24 hours/day, Family Type of Home: House Home Access: Ramped entrance [place]: One  level Bathroom Shower/Tub: Tub/shower unit, Architectural technologist: Standard Home Equipment: Environmental consultant - 2 wheels, Bedside commode, Wheelchair - manual, Wheelchair - power, Hospital bed, Hand held shower head Additional Comments: per mom the [facility] and hospital bed need to be replaced due to poor condition from wear  Functional History: Prior Function Level of Independence: Needs assistance Gait / Transfers Assistance Needed: non ambulatory only performs stand pivots to the left other than from toilet where she transfers to the right as well ADL's / Homemaking Assistance Needed: Feeds self, does dressing except cannot stand and pull up clothing at same time, can bath self once A'd into tub; can toilet with assist for pants Comments: pt was transferring on her own until approximately 10 days prior to admission when she required help from mom due to weakness and pain.  Functional Status:  Mobility: Bed Mobility Overal bed mobility: Needs Assistance Bed Mobility: Supine to Sit Rolling: Mod assist (use or rail) Supine to sit: Min guard, HOB elevated Sit to supine: Mod assist (Need A for RLE and trunk) General bed mobility comments: use of rail and coming out on left side of bed, increased time, momentum, and hooking her RLE with her LLE. pt able to adjust pelvis and problem solve with transfer to perform on her own. She transfers to left at home Transfers Overall transfer level: Needs assistance Equipment used: 1 person hand held assist Transfers: Sit to/from Stand, [employer] Transfers Sit to Stand: Min assist, Mod assist Squat pivot transfers: Min assist General transfer comment: For sit<>stand; 4 times. Min A from bed, min A from recliner with chair in front of her; Mod A from recliner holding onto sink on her left side and sink in front of her. Pt with tendency with posterior lean and right knee going into extension with sit>stand (verbal cues to lean more foreward and A to block RLE)--per  mother with sit>stand pt usually has her RLE behind her LLE with sit to stand      ADL: ADL Overall ADL's : Needs assistance/impaired Eating/Feeding: Set up (supported sitting) Grooming: Wash/dry hands, Wash/dry face, Set up Grooming Details (indicate cue type and reason): supported sitting Upper Body Bathing: Minimal assistance (supported sitting) Lower Body Bathing: Moderate assistance Lower Body Bathing Details (indicate cue type and reason): supported sit/lateral leans Upper Body Dressing : Minimal assistance Upper Body Dressing Details (indicate cue type and reason): supported sitting Lower Body Dressing: Moderate assistance Lower Body Dressing Details (indicate cue type and reason): S to doff socks, min guard A to don socks; min A for sit>stand and total A to pull up/down clothing Toilet Transfer: Minimal assistance, Control and instrumentation engineer Details (indicate cue type and reason): bed>recliner going to left  Cognition: Cognition Overall Cognitive Status: Within Functional Limits for tasks assessed Orientation Level: Oriented X4 Cognition Arousal/Alertness: Awake/alert Behavior During Therapy: Flat affect Overall Cognitive Status: Within Functional Limits for tasks assessed General Comments: Some of the information she gave [place] was not what her mother gave [place] as far as her PLOF with transfers and clothing management  Blood pressure (!) 146/98, pulse (!) 105, temperature 98.9 F (37.2 C), temperature source Oral, resp. rate 18, height 5\' 7"  (1.702 m),  weight 77.1 kg (170 lb), SpO2 99 %. Physical Exam  Constitutional:  56 year old right-handed female  HENT:  Head: Normocephalic.  Eyes:  Pupils reactive to light  Neck: Normal range of motion. Neck supple. No thyromegaly present.  Cardiovascular: Normal rate and regular rhythm.   Respiratory: Effort normal and breath sounds normal. No respiratory distress.  GI: Soft. Bowel sounds are normal. She exhibits no distension.   Musculoskeletal: She exhibits edema.  Bilateral LE edema left more than right  Neurological: She is alert.  Patient is oriented to person, place and time. She provides her date of birth. Fair awareness of deficits. Dense RUE hemiparesis. RLE 1-2/5 in extension synergy pattern.  Dysconjugate gaze. 1-2/4 tone RLE. 3 beats clonus right ankle  Psychiatric: She has a normal mood and affect. Her behavior is normal.    Results for orders placed or performed during the hospital encounter of 06/11/16 (from the past 24 hour(s))  Heparin level (unfractionated)     Status: None   Collection Time: 06/15/16  3:40 AM  Result Value Ref Range   Heparin Unfractionated 0.55 0.30 - 0.70 IU/mL  CBC     Status: Abnormal   Collection Time: 06/15/16  3:40 AM  Result Value Ref Range   WBC 11.7 (H) 4.0 - 10.5 K/uL   RBC 2.67 (L) 3.87 - 5.11 MIL/uL   Hemoglobin 8.1 (L) 12.0 - 15.0 g/dL   HCT 24.3 (L) 36.0 - 46.0 %   MCV 91.0 78.0 - 100.0 fL   MCH 30.3 26.0 - 34.0 pg   MCHC 33.3 30.0 - 36.0 g/dL   RDW 14.1 11.5 - 15.5 %   Platelets 208 150 - 400 K/uL  Comprehensive metabolic panel     Status: Abnormal   Collection Time: 06/15/16  3:40 AM  Result Value Ref Range   Sodium 134 (L) 135 - 145 mmol/L   Potassium 3.6 3.5 - 5.1 mmol/L   Chloride 102 101 - 111 mmol/L   CO2 24 22 - 32 mmol/L   Glucose, Bld 134 (H) 65 - 99 mg/dL   BUN 25 (H) 6 - 20 mg/dL   Creatinine, Ser 1.51 (H) 0.44 - 1.00 mg/dL   Calcium 8.6 (L) 8.9 - 10.3 mg/dL   Total Protein 7.0 6.5 - 8.1 g/dL   Albumin 2.0 (L) 3.5 - 5.0 g/dL   AST 95 (H) 15 - 41 U/L   ALT 43 14 - 54 U/L   Alkaline Phosphatase 139 (H) 38 - 126 U/L   Total Bilirubin 2.0 (H) 0.3 - 1.2 mg/dL   GFR calc non Af Amer 38 (L) >60 mL/min   GFR calc Af Amer 44 (L) >60 mL/min   Anion gap 8 5 - 15   No results found.  Assessment/Plan: Diagnosis: debility due bilateral LE DVT's with history of prior left CVA with right hemiparesis 1. Does the need for close, 24 hr/day medical  supervision in concert with the patient's rehab needs make it unreasonable for this patient to be served in a less intensive setting? Yes 2. Co-Morbidities requiring supervision/potential complications: HTN, depression, spasticity 3. Due to bladder management, bowel management, safety, skin/wound care, disease management, medication administration, pain management and patient education, does the patient require 24 hr/day rehab nursing? Yes 4. Does the patient require coordinated care of a physician, rehab nurse, PT (1-2 hrs/day, 5 days/week) and OT (1-2 hrs/day, 5 days/week) to address physical and functional deficits in the context of the above medical diagnosis(es)? Yes Addressing deficits in the  following areas: balance, endurance, locomotion, strength, transferring, bowel/bladder control, bathing, dressing, feeding, grooming, toileting and psychosocial support 5. Can the patient actively participate in an intensive therapy program of at least 3 hrs of therapy per day at least 5 days per week? Yes 6. The potential for patient to make measurable gains while on inpatient rehab is excellent 7. Anticipated functional outcomes upon discharge from inpatient rehab are modified independent, supervision, min assist and w/c level  with PT, modified independent, supervision and min assist with OT, n/a with SLP. 8. Estimated rehab length of stay to reach the above functional goals is: 8-10 days 9. Anticipated D/C setting: Home 10. Anticipated post D/C treatments: HH therapy and Outpatient therapy 11. Overall Rehab/Functional Prognosis: good  RECOMMENDATIONS: This patient's condition is appropriate for continued rehabilitative care in the following setting: CIR Patient has agreed to participate in recommended program. Yes Note that insurance prior authorization may be required for reimbursement for recommended care.  Comment: Rehab Admissions Coordinator to follow up.  Thanks,  [name], MD,  Mellody Drown    Cathlyn Parsons., PA-C 06/15/2016

## 2016-06-15 NOTE — Progress Notes (Signed)
[name] PROGRESS NOTE  Ann Lynch EAV:[id number] DOB: 1960-08-25 DOA: 06/11/2016   PCP: [name], MD     Assessment/Plan: Principal Problem:   Sepsis due to cellulitis [facility]) Active Problems:   AKI (acute kidney injury) ([place])   CVA, old, hemiparesis ([place])   HTN (hypertension)   56 y.o. female with medical history significant of hypertension, CVA with right-sided hemiplegia who was brought to the emergency department with left lower extremity pain after having a fall 2 days ago, followed by drowsiness since yesterday. Patient presented with altered mental status and left leg pain. Admitted for left leg swelling, acute kidney injury, found to have extensive DVT of the left leg.  Assessment and plan Left leg pain,doubt cellulitis or sepsis  Continue telemetry, admission lactic acid within normal limits,   Started on  vancomycin and Zosyn per pharmacy. Antibiotics have now been discontinued Blood culture showed gram-positive rods, essentially contaminant Leukocytosis   improving Primary issue seems to be extensive DVT      AKI (acute kidney injury) ([place]) presented with a creatinine of 5.22>now 1.51 Hold Micardis HCT. Hyponatremia likely secondary to HCTZ,resolved  Hold the spironolactone in the setting of dehydration. Renal ultrasound shows medical renal disease without any hydronephrosis Renal function is improving,bolus today due to low BP Creatinine improving slowly, baseline unknown but probably within normal range based on 06/10/13 CK normal today, IV fluids discontinued, patient encouraged to eat and drink    CVA, old, hemiparesis ([place]) Supportive care. Continue prophylactic phenytoin 100 mg by mouth 3 times a day. Phenytoin level is pending    HTN (hypertension) Continue metoprolol 100 mg by mouth twice a day. Reduce  clonidine 0.2 mg by mouth 2 times a day Monitor blood pressure. Bolus 500 cc as BP soft  Extensive pelvic and B/L LE  DVT's  Left - Positive for acute DVT and age indeterminate DVT in the iliac, common femoral, femoral, and popliteal veins. There is also superficial thromobosis of the saphenofemoral junction and the proximal greater saphenous vein. Right - Positive for acute DVT and age indeterminate DVT of the iliac, common femoral, femoral, and popliteal veins  Patient has been on heparin drip since 5/26. Now transition to [place] Not a candidate for thrombolysis due to renal insufficiency, per vascular surgery Dr. Donnetta Hutching Patient would need outpatient age-appropriate screening for malignancy to be arranged for by PCP   Cough Chest x-ray to rule out aspiration  DVT prophylaxsis Xarelto  Code Status:  Full code    Family Communication: Discussed in detail with the patient's mother and sister by the bedside , all imaging results, lab results explained to the patient   Disposition Plan:  CIR tomorrow when creatinine is back to baseline     Consultants:  Vascular  Procedures:  None  Antibiotics: Anti-infectives    Start     Dose/Rate Route Frequency Ordered Stop   06/13/16 2100  vancomycin (VANCOCIN) IVPB 1000 mg/200 mL premix  Status:  Discontinued     1,000 mg 200 mL/hr over 60 Minutes Intravenous Every 48 hours 06/12/16 0341 06/13/16 0918   06/12/16 0600  piperacillin-tazobactam (ZOSYN) IVPB 2.25 g  Status:  Discontinued     2.25 g 100 mL/hr over 30 Minutes Intravenous Every 8 hours 06/12/16 0137 06/13/16 0918   06/12/16 0000  vancomycin (VANCOCIN) IVPB 1000 mg/200 mL premix     1,000 mg 200 mL/hr over 60 Minutes Intravenous  Once 06/11/16 2258 06/12/16 0131   06/11/16  1745  piperacillin-tazobactam (ZOSYN) IVPB 3.375 g     3.375 g 100 mL/hr over 30 Minutes Intravenous  Once 06/11/16 1741 06/11/16 2034         HPI/Subjective:  Complains of a slight cough,  Objective: Vitals:   06/13/16 2031 06/14/16 1344 06/14/16 1939 06/15/16 0300  BP: 105/66 (!) 136/93 (!) 156/100 (!)  146/98  Pulse: (!) 104 99 (!) 113 (!) 105  Resp: 18 16 18 18   Temp: 99.8 F (37.7 C) 99.9 F (37.7 C) 98.9 F (37.2 C) 98.9 F (37.2 C)  TempSrc: Oral Oral Oral Oral  SpO2: 97% 98% 100% 99%  Weight:      Height:        Intake/Output Summary (Last 24 hours) at 06/15/16 1211 Last data filed at 06/14/16 2300  Gross per 24 hour  Intake              480 ml  Output             1200 ml  Net             -720 ml    Exam:  Cardiovascular: Regular rate and rhythm, no murmurs / rubs / gallops. No extremity edema. 2+ pedal pulses. No carotid bruits.  Abdomen: Soft, no tenderness, no masses palpated. No hepatosplenomegaly. Bowel sounds positive.  Musculoskeletal:  swelling of the left lower extremity Skin no rashes Neurologic: CN 2-12 grossly intact. Sensation intact, Right hemiplegia.     Data Reviewed: I have personally reviewed following labs and imaging studies  Micro Results Recent Results (from the past 240 hour(s))  Culture, blood (routine x 2)     Status: None (Preliminary result)   Collection Time: 06/11/16  5:19 PM  Result Value Ref Range Status   Specimen Description BLOOD LEFT ANTECUBITAL  Final   Special Requests   Final    BOTTLES DRAWN AEROBIC AND ANAEROBIC Blood Culture adequate volume   Culture NO GROWTH 3 DAYS  Final   Report Status PENDING  Incomplete  Culture, blood (routine x 2)     Status: None   Collection Time: 06/11/16  5:24 PM  Result Value Ref Range Status   Specimen Description BLOOD LEFT FOREARM  Final   Special Requests   Final    BOTTLES DRAWN AEROBIC AND ANAEROBIC Blood Culture results may not be optimal due to an inadequate volume of blood received in culture bottles   Culture  Setup Time   Final    GRAM POSITIVE RODS AEROBIC BOTTLE ONLY CRITICAL RESULT CALLED TO, READ BACK BY AND VERIFIED WITH: E MARTIN,PHARMD AT 1800 06/13/16 BY L BENFIELD    Culture   Final    GRAM POSITIVE RODS THE SIGNIFICANCE OF ISOLATING THIS ORGANISM FROM A SINGLE SET  OF BLOOD CULTURES WHEN MULTIPLE SETS ARE DRAWN IS UNCERTAIN. PLEASE NOTIFY THE MICROBIOLOGY DEPARTMENT WITHIN ONE WEEK IF SPECIATION AND SENSITIVITIES ARE REQUIRED.    Report Status 06/15/2016 FINAL  Final  Blood Culture ID Panel (Reflexed)     Status: None   Collection Time: 06/11/16  5:24 PM  Result Value Ref Range Status   Enterococcus species NOT DETECTED NOT DETECTED Final   Listeria monocytogenes NOT DETECTED NOT DETECTED Final   Staphylococcus species NOT DETECTED NOT DETECTED Final   Staphylococcus aureus NOT DETECTED NOT DETECTED Final   Streptococcus species NOT DETECTED NOT DETECTED Final   Streptococcus agalactiae NOT DETECTED NOT DETECTED Final   Streptococcus pneumoniae NOT DETECTED NOT DETECTED Final   Streptococcus  pyogenes NOT DETECTED NOT DETECTED Final   Acinetobacter baumannii NOT DETECTED NOT DETECTED Final   Enterobacteriaceae species NOT DETECTED NOT DETECTED Final   Enterobacter cloacae complex NOT DETECTED NOT DETECTED Final   Escherichia coli NOT DETECTED NOT DETECTED Final   Klebsiella oxytoca NOT DETECTED NOT DETECTED Final   Klebsiella pneumoniae NOT DETECTED NOT DETECTED Final   Proteus species NOT DETECTED NOT DETECTED Final   Serratia marcescens NOT DETECTED NOT DETECTED Final   Haemophilus influenzae NOT DETECTED NOT DETECTED Final   Neisseria meningitidis NOT DETECTED NOT DETECTED Final   Pseudomonas aeruginosa NOT DETECTED NOT DETECTED Final   Candida albicans NOT DETECTED NOT DETECTED Final   Candida glabrata NOT DETECTED NOT DETECTED Final   Candida krusei NOT DETECTED NOT DETECTED Final   Candida parapsilosis NOT DETECTED NOT DETECTED Final   Candida tropicalis NOT DETECTED NOT DETECTED Final  Urine culture     Status: None   Collection Time: 06/11/16  9:07 PM  Result Value Ref Range Status   Specimen Description URINE, CLEAN CATCH  Final   Special Requests NONE  Final   Culture NO GROWTH  Final   Report Status 06/13/2016 FINAL  Final     Radiology Reports Dg Pelvis 1-2 Views  Result Date: 06/11/2016 CLINICAL DATA:  Recent fall with pelvic pain, initial encounter EXAM: PELVIS - 1-2 VIEW COMPARISON:  None. FINDINGS: Pelvic ring appears intact. The proximal right femur is within normal limits. Some linear bony densities are noted superimposed over the right hip joint likely related to dystrophic bone formation although the possibility of a pelvic fracture in the posterior aspect of the right acetabulum could not be excluded. CT would be helpful for further evaluation. IMPRESSION: Irregular bony density overlying the right hip joint which is likely chronic although the possibility of an acute acetabular fracture deserves consideration. CT of the pelvis is recommended for further evaluation. Electronically Signed   By: [name] M.D.   On: 06/11/2016 18:30   Dg Knee 1-2 Views Left  Result Date: 06/11/2016 CLINICAL DATA:  Altered mental status and recent fall with left knee pain, initial encounter EXAM: LEFT KNEE - 2 VIEW COMPARISON:  None. FINDINGS: No evidence of fracture, dislocation, or joint effusion. No evidence of arthropathy or other focal bone abnormality. Soft tissues are unremarkable. IMPRESSION: No acute abnormality noted. Electronically Signed   By: [name] M.D.   On: 06/11/2016 18:31   Ct Head Wo Contrast  Result Date: 06/11/2016 CLINICAL DATA:  Altered mental status. EXAM: CT HEAD WITHOUT CONTRAST TECHNIQUE: Contiguous axial images were obtained from the base of the skull through the vertex without intravenous contrast. COMPARISON:  Head CT 06/08/2013 and MRI 06/09/2013 FINDINGS: Brain: A chronic left thalamic infarct is again seen with associated ex vacuo dilatation of the left lateral ventricle and wallerian degeneration along the corticospinal tract. Confluent gliosis extending superiorly into the centrum semiovale is unchanged. Chronic small vessel ischemic changes in the right cerebral white matter are also  similar to the prior studies. No acute infarct, acute intracranial hemorrhage, mass, or extra-axial fluid collection is seen. Vascular: Calcified atherosclerosis at the skullbase, age advanced. No hyperdense vessel. Skull: No fracture or focal osseous lesion. Sinuses/Orbits: Paranasal sinuses and mastoid air cells are clear. Unremarkable orbits. Other: None. IMPRESSION: 1. No evidence of acute intracranial abnormality. 2. Chronic left thalamic infarct and chronic small vessel ischemic disease. Electronically Signed   By: Logan Bores M.D.   On: 06/11/2016 20:56   US Renal  Result Date: 06/12/2016 CLINICAL DATA:  Acute kidney injury, hypertension EXAM: RENAL / URINARY TRACT ULTRASOUND COMPLETE COMPARISON:  06/08/2013 FINDINGS: Right Kidney: Length: 10.2 cm. Normal cortical thickness. Increased cortical echogenicity. No mass, hydronephrosis or shadowing calcification. Left Kidney: Length: 10.8 cm. Normal cortical thickness. Increased cortical echogenicity. No mass, hydronephrosis or shadowing calcification. Small amount of perinephric fluid at inferior pole. Bladder: Appears normal for degree of bladder distention. Incidentally noted minimal ascites adjacent to liver. IMPRESSION: Medical renal disease changes of both kidneys without mass or hydronephrosis. Minimal ascites. Electronically Signed   By: Lavonia Dana M.D.   On: 06/12/2016 10:06   Ct Pelvis Limited Wo Contrast  Result Date: 06/12/2016 CLINICAL DATA:  Left thigh pain after recent fall. Abnormal radiographs. EXAM: CT PELVIS WITHOUT CONTRAST TECHNIQUE: Multidetector CT imaging of the pelvis was performed following the standard protocol without intravenous contrast. COMPARISON:  Radiographs 06/11/2016. FINDINGS: Urinary Tract: Mild diffuse bladder wall thickening without apparent focal abnormality. The distal ureters are not well visualized, but do not appear dilated. Bowel: No bowel wall thickening, distention or focal surrounding inflammation  identified in the pelvis. Vascular/Lymphatic: There are asymmetric mildly enlarged left external iliac and inguinal lymph nodes, measuring up to 12 mm short axis. There is edema surrounding the left external iliac and proximal femoral vessels. The left external iliac and femoral veins appear mildly distended with heterogeneous, generally increased density. Appearance is suspicious for DVT. Mild aortoiliac atherosclerosis noted. Reproductive: The uterus appears unremarkable. No evidence of adnexal mass. Other: Moderate amount of free pelvic fluid. There is nonspecific soft tissue edema throughout the pelvis. Musculoskeletal: No evidence of acute fracture, dislocation or femoral head avascular necrosis. 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 This certainly has the potential to restrict external rotation of the hip. The hip joint spaces are maintained. There are mild sacroiliac degenerative changes bilaterally. There is more advanced facet hypertrophy in the lower lumbar spine, worst on the right at L5-S1. Asymmetric diffuse atrophy of the right hip musculature is noted. IMPRESSION: 1. No acute osseous findings. 2. 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 This may restrict external rotation of the hip and is associated with diffuse right pelvic muscular atrophy. 3. Suspicion of left pelvic and femoral vein DVT. Correlation with venous Doppler recommended. 4. Nonspecific pelvic edema and mild left inguinal adenopathy. 5. These results will be called to the ordering clinician or representative by the Radiologist Assistant, and communication documented in the PACS or zVision Dashboard. Electronically Signed   By: Richardean Sale M.D.   On: 06/12/2016 11:19   Dg Chest Port 1 View  Result Date: 06/11/2016 CLINICAL DATA:  Altered mental status EXAM: PORTABLE CHEST 1 VIEW COMPARISON:  06/08/2013  FINDINGS: Cardiac shadow is mildly enlarged. The lungs are well aerated bilaterally. No focal infiltrate or sizable effusion is seen. Previously seen masslike densities are less well appreciated on the current exam. IMPRESSION: No acute abnormality seen. Electronically Signed   By: [name] M.D.   On: 06/11/2016 18:28   Dg Femur Min 2 Views Left  Result Date: 06/11/2016 CLINICAL DATA:  Left leg pain following recent fall EXAM: LEFT FEMUR 2 VIEWS COMPARISON:  None. FINDINGS: Degenerative changes of left hip joint are seen. No acute fracture or dislocation is noted. Vascular calcifications are seen. No soft tissue abnormality is noted. IMPRESSION: Degenerative change without acute abnormality. Electronically Signed   By: [name] M.D.   On:  06/11/2016 18:29     CBC  Recent Labs Lab 06/11/16 1703 06/12/16 0632 06/13/16 0245 06/14/16 0310 06/15/16 0340  WBC 13.2* 12.0* 10.0 11.2* 11.7*  HGB 8.7* 8.4* 7.9* 8.5* 8.1*  HCT 26.8* 25.1* 24.1* 26.2* 24.3*  PLT 171 169 171 267 208  MCV 90.8 90.3 89.9 90.7 91.0  MCH 29.5 30.2 29.5 29.4 30.3  MCHC 32.5 33.5 32.8 32.4 33.3  RDW 13.5 13.5 13.6 13.8 14.1  LYMPHSABS 1.1 0.7 0.8 1.5  --   MONOABS 1.2* 1.4* 1.5* 1.6*  --   EOSABS 0.1 0.1 0.1 0.2  --   BASOSABS 0.0 0.0 0.0 0.0  --     Chemistries   Recent Labs Lab 06/11/16 1703 06/12/16 0632 06/13/16 0245 06/14/16 0310 06/15/16 0340  NA 131* 130* 136 135 134*  K 3.8 3.2* 4.5 4.2 3.6  CL 94* 97* 105 103 102  CO2 23 20* 22 22 24   GLUCOSE 131* 165* 129* 119* 134*  BUN 63* 57* 43* 34* 25*  CREATININE 5.22* 4.10* 2.92* 2.05* 1.51*  CALCIUM 8.9 8.2* 8.1* 8.5* 8.6*  MG  --  2.2  --   --   --   AST 114* 108* 96* 89* 95*  ALT 51 48 46 43 43  ALKPHOS 111 106 113 122 139*  BILITOT 1.3* 1.5* 1.4* 1.3* 2.0*   ------------------------------------------------------------------------------------------------------------------ estimated creatinine clearance is 46.1 mL/min (A) (by C-G formula  based on SCr of 1.51 mg/dL (H)). ------------------------------------------------------------------------------------------------------------------ No results for input(s): HGBA1C in the last 72 hours. ------------------------------------------------------------------------------------------------------------------ No results for input(s): CHOL, HDL, LDLCALC, TRIG, CHOLHDL, LDLDIRECT in the last 72 hours. ------------------------------------------------------------------------------------------------------------------ No results for input(s): TSH, T4TOTAL, T3FREE, THYROIDAB in the last 72 hours.  Invalid input(s): FREET3 ------------------------------------------------------------------------------------------------------------------ No results for input(s): VITAMINB12, FOLATE, FERRITIN, TIBC, IRON, RETICCTPCT in the last 72 hours.  Coagulation profile  Recent Labs Lab 06/12/16 1153  INR 1.11    No results for input(s): DDIMER in the last 72 hours.  Cardiac Enzymes No results for input(s): CKMB, TROPONINI, MYOGLOBIN in the last 168 hours.  Invalid input(s): CK ------------------------------------------------------------------------------------------------------------------ Invalid input(s): POCBNP   CBG:  Recent Labs Lab 06/11/16 1729  GLUCAP 110*       Studies: No results found.    Lab Results  Component Value Date   HGBA1C 5.3 06/09/2013   Lab Results  Component Value Date   LDLCALC 131 (H) 06/09/2013   CREATININE 1.51 (H) 06/15/2016       Scheduled Meds: . amitriptyline  25 mg Oral QHS  . aspirin EC  81 mg Oral Daily  . atorvastatin  40 mg Oral QHS  . cloNIDine  0.2 mg Oral BID  . DULoxetine  20 mg Oral QHS  . feeding supplement (NEPRO CARB STEADY)  237 mL Oral BID BM  . folic acid  1 mg Oral Daily  . gabapentin  400 mg Oral TID  . metoprolol tartrate  100 mg Oral BID  . phenytoin  100 mg Oral TID   Continuous Infusions:    LOS: 4 days     Time spent: >30 MINS    Reyne Dumas  [facility] Pager [phone]. If 7PM-7AM, please contact night-coverage at [email or web address], password [facility] 06/15/2016, 12:11 PM  LOS: 4 days

## 2016-06-15 NOTE — Progress Notes (Signed)
I met with pt at bedside, and with her permission contacted her Mom by phone. We discussed a possible inpt rehab admit pending insurance approval with [employer]. Both are in agreement. I will begin insurance authorization for a possible admit tomorrow if insurance approved. I will follow up in the morning. [phone]

## 2016-06-15 NOTE — Progress Notes (Signed)
Leslie for heparin> lovenox/coumadin Indication: DVT  No Known Allergies  Patient Measurements: Height: 5\' 7"  (170.2 cm) Weight: 170 lb (77.1 kg) IBW/kg (Calculated) : 61.6 Heparin Dosing Weight: 77 kg  Vital Signs: Temp: 98.9 F (37.2 C) (05/29 0300) Temp Source: Oral (05/29 0300) BP: 146/98 (05/29 0300) Pulse Rate: 105 (05/29 0300)  Labs:  Recent Labs  06/13/16 0245 06/14/16 0310 06/15/16 0340  HGB 7.9* 8.5* 8.1*  HCT 24.1* 26.2* 24.3*  PLT 171 267 208  HEPARINUNFRC 0.58 0.71* 0.55  CREATININE 2.92* 2.05* 1.51*  CKTOTAL 129 75  --      Medical History: Past Medical History:  Diagnosis Date  . Hypertension   . Stroke [facility])      Assessment: 56 yo female admitted with lower extremity pain after falling a couple days ago. She is wheelchair bound at baseline. 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 Plans were for Xarelto but there is an interaction with phenytoin.  Phenytoin is a strong CYP3A4 and P-gp inducer and should not be used with DOACs (although there is little data with edoxaban).   Spoke with Dr. Allyson Sabal and plans are for coumadin/lovenox -SCr= 1.51 (down, CrCl ~ 45) -Hg= 8.1 -baseline INR= 1.11, AST/ALT= 95/43, t bili= 2   Goal of Therapy:  Heparin level 0.3-0.7 units/ml Monitor platelets by anticoagulation protocol: Yes    Plan:  -Coumadin 7.5mg  po today -Daily PT/INR -Lovenox 120mg  [place] daily (~1.5mg /kg/day) -CBC every 3 days  Hildred Laser, Pharm D 06/15/2016 1:38 PM

## 2016-06-15 NOTE — H&P (Signed)
Physical Medicine and Rehabilitation Admission H&P    Chief Complaint  Patient presents with  . Fall  . Altered Mental Status  : HPI: Ann Lynch is a 56 y.o. 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 Per chart review patient lives with mother and essentially wheelchair bound however she was able to perform stand pivot transfers with assistance. Presented 06/11/2016 with left lower extremity pain after recent fall. CT of the head negative for acute changes. CT of the pelvis showed suspicion of left pelvic and femoral vein DVT. Bilateral lower extremity Doppler showed left- positive acute DVT indeterminate age in the iliac, common femoral, femoral and popliteal veins. Also superficial thrombosis of the saphenofemoral junction and the proximal greater saphenous vein. Right positive DVT iliac, common femoral, femoral popliteal veins. Placed on Lovenox with bridging transition to Coumadin.. Findings of elevated creatinine 5.22 on admission and sodium 130. Renal ultrasound negative for hydronephrosis. Latest renal function 2015 within normal limits. Myocarditis was held as well as HCTZ. Vascular surgery follow-up for bilateral DVTs not a candidate for thrombo-lysis due to renal insufficiency. Renal function normalizing 1.43.Patient spiked low-grade fever question related to DVTs. WBC 11,700. Chest x-ray showed atelectasis versus pneumonia at the bases greater on the left.CT of the chest showed small right and trace left dependent pleural effusions. Consolidation and volume loss in the dependent right lower lobe favoring compressive atelectasis. Placed on Augmentin 06/16/2016 for empiric coverage. Physical and occupational therapy evaluation completed 06/15/2016 with recommendations of physical medicine rehabilitation consult. Patient was admitted for a comprehensive rehabilitation program  Review of  Systems  Constitutional: Positive for malaise/fatigue. Negative for chills and fever.       Right sided weakness  HENT: Negative for hearing loss.   Eyes: Negative for blurred vision and double vision.  Respiratory: Negative for cough and shortness of breath.   Cardiovascular: Positive for leg swelling. Negative for chest pain and palpitations.  Gastrointestinal: Positive for constipation and nausea. Negative for vomiting.  Genitourinary: Negative for dysuria, flank pain and hematuria.  Musculoskeletal: Positive for joint pain and myalgias.  Skin: Negative for rash.  Neurological: Positive for speech change and seizures.  All other systems reviewed and are negative.  Past Medical History:  Diagnosis Date  . Hypertension   . Stroke [facility])    Past Surgical History:  Procedure Laterality Date  . TRACHEOSTOMY     Family History  Problem Relation Age of Onset  . Hypertension Mother   . Diabetes Mellitus II Mother   . Hypertension Father    Social History:  reports that she has never smoked. She has never used smokeless tobacco. She reports that she does not drink alcohol. Her drug history is not on file. Allergies: No Known Allergies Medications Prior to Admission  Medication Sig Dispense Refill  . amitriptyline (ELAVIL) 25 MG tablet Take 25 mg by mouth at bedtime.    Marland Kitchen aspirin EC 81 MG tablet Take 81 mg by mouth daily.    Marland Kitchen atorvastatin (LIPITOR) 40 MG tablet Take 40 mg by mouth at bedtime.    . cloNIDine (CATAPRES) 0.3 MG tablet Take 1 tablet (0.3 mg total) by mouth 4 (four) times daily. (Patient taking differently: Take 0.2 mg by mouth 4 (four) times daily. ) 120 tablet 0  . DULoxetine (CYMBALTA) 20 MG capsule Take 20 mg by mouth at bedtime.     . folic acid (FOLVITE) 1 MG tablet Take 1  mg by mouth daily.    Marland Kitchen gabapentin (NEURONTIN) 400 MG capsule Take 400 mg by mouth 3 (three) times daily.    . metoprolol (LOPRESSOR) 100 MG tablet Take 100 mg by mouth 2 (two) times daily.    .  phenytoin (DILANTIN) 100 MG ER capsule Take 100 mg by mouth 3 (three) times daily.     Marland Kitchen spironolactone (ALDACTONE) 25 MG tablet Take 25 mg by mouth 3 (three) times daily.    Marland Kitchen telmisartan-hydrochlorothiazide (MICARDIS HCT) 80-12.5 MG per tablet Take 1 tablet by mouth daily.      Home: Home Living Family/patient expects to be discharged to:: Inpatient rehab Living Arrangements: Parent Available Help at Discharge: Available 24 hours/day, Family Type of Home: House Home Access: Ramped entrance [place]: One level Bathroom Shower/Tub: Tub/shower unit, Architectural technologist: Standard Home Equipment: Environmental consultant - 2 wheels, Bedside commode, Wheelchair - manual, Wheelchair - power, Hospital bed, Hand held shower head Additional Comments: per mom the [facility] and hospital bed need to be replaced due to poor condition from wear   Functional History: Prior Function Level of Independence: Needs assistance Gait / Transfers Assistance Needed: non ambulatory only performs stand pivots to the left other than from toilet where she transfers to the right as well ADL's / Homemaking Assistance Needed: Feeds self, does dressing except cannot stand and pull up clothing at same time, can bath self once A'd into tub; can toilet with assist for pants Comments: pt was transferring on her own until approximately 10 days prior to admission when she required help from mom due to weakness and pain.   Functional Status:  Mobility: Bed Mobility Overal bed mobility: Needs Assistance Bed Mobility: Supine to Sit Rolling: Mod assist (use or rail) Supine to sit: Min guard, HOB elevated Sit to supine: Mod assist (Need A for RLE and trunk) General bed mobility comments: use of rail and coming out on left side of bed, increased time, momentum, and hooking her RLE with her LLE. pt able to adjust pelvis and problem solve with transfer to perform on her own. She transfers to left at home Transfers Overall transfer level: Needs  assistance Equipment used: 1 person hand held assist Transfers: Sit to/from Stand, Google Transfers Sit to Stand: Min assist, Mod assist Squat pivot transfers: Min assist General transfer comment: For sit<>stand; 4 times. Min A from bed, min A from recliner with chair in front of her; Mod A from recliner holding onto sink on her left side and sink in front of her. Pt with tendency with posterior lean and right knee going into extension with sit>stand (verbal cues to lean more foreward and A to block RLE)--per mother with sit>stand pt usually has her RLE behind her LLE with sit to stand      ADL: ADL Overall ADL's : Needs assistance/impaired Eating/Feeding: Set up (supported sitting) Grooming: Wash/dry hands, Wash/dry face, Set up Grooming Details (indicate cue type and reason): supported sitting Upper Body Bathing: Minimal assistance (supported sitting) Lower Body Bathing: Moderate assistance Lower Body Bathing Details (indicate cue type and reason): supported sit/lateral leans Upper Body Dressing : Minimal assistance Upper Body Dressing Details (indicate cue type and reason): supported sitting Lower Body Dressing: Moderate assistance Lower Body Dressing Details (indicate cue type and reason): S to doff socks, min guard A to don socks; min A for sit>stand and total A to pull up/down clothing Toilet Transfer: Minimal assistance, Control and instrumentation engineer Details (indicate cue type and reason): bed>recliner going to  left  Cognition: Cognition Overall Cognitive Status: Within Functional Limits for tasks assessed Orientation Level: Oriented X4 Cognition Arousal/Alertness: Awake/alert Behavior During Therapy: Flat affect Overall Cognitive Status: Within Functional Limits for tasks assessed General Comments: Some of the information she gave [place] was not what her mother gave [place] as far as her PLOF with transfers and clothing management  Physical Exam: Blood pressure 133/88, pulse (!)  103, temperature 99.6 F (37.6 C), temperature source Oral, resp. rate 18, height _0  (1.702 m), weight 77.1 kg (170 lb), SpO2 99 %. Physical Exam  Constitutional:  56 year old right-handed female appearing older than stated age  Eyes:  Pupils reactive to light  Neck: Normal range of motion. Neck supple. No thyromegaly present.  Cardiovascular: Normal rate and regular rhythm.   Respiratory:  Decreased breath sounds at the bases but clear to auscultation  GI: She exhibits distension. There is no tenderness.  Bowel sounds scarce  Skin. Warm and dry Musculoskeletal: She exhibits edema.  Bilateral LE edema left more affected than right. Both legs with minimal tenderness. Neurological: She is alert.  Patient is oriented to person, place and time. She provides her date of birth. Fair awareness of deficits. Dense RUE hemiparesis. RLE 1-2/5 prox to distal with extensor pattern.  Dysconjugate gaze. 1-2/4 tone RLE. 3 beats clonus right ankle  Psych: pt cooperative but flat   Results for orders placed or performed during the hospital encounter of 06/11/16 (from the past 48 hour(s))  CBC with Differential     Status: Abnormal   Collection Time: 06/14/16  3:10 AM  Result Value Ref Range   WBC 11.2 (H) 4.0 - 10.5 K/uL   RBC 2.89 (L) 3.87 - 5.11 MIL/uL   Hemoglobin 8.5 (L) 12.0 - 15.0 g/dL   HCT 26.2 (L) 36.0 - 46.0 %   MCV 90.7 78.0 - 100.0 fL   MCH 29.4 26.0 - 34.0 pg   MCHC 32.4 30.0 - 36.0 g/dL   RDW 13.8 11.5 - 15.5 %   Platelets 267 150 - 400 K/uL   Neutrophils Relative % 71 %   Neutro Abs 7.9 (H) 1.7 - 7.7 K/uL   Lymphocytes Relative 13 %   Lymphs Abs 1.5 0.7 - 4.0 K/uL   Monocytes Relative 14 %   Monocytes Absolute 1.6 (H) 0.1 - 1.0 K/uL   Eosinophils Relative 2 %   Eosinophils Absolute 0.2 0.0 - 0.7 K/uL   Basophils Relative 0 %   Basophils Absolute 0.0 0.0 - 0.1 K/uL  CK     Status: None   Collection Time: 06/14/16  3:10 AM  Result Value Ref Range   Total CK 75 38 - 234  U/L  Heparin level (unfractionated)     Status: Abnormal   Collection Time: 06/14/16  3:10 AM  Result Value Ref Range   Heparin Unfractionated 0.71 (H) 0.30 - 0.70 IU/mL    Comment:        IF HEPARIN RESULTS ARE BELOW EXPECTED VALUES, AND PATIENT DOSAGE HAS BEEN CONFIRMED, SUGGEST FOLLOW UP TESTING OF ANTITHROMBIN III LEVELS.   Comprehensive metabolic panel     Status: Abnormal   Collection Time: 06/14/16  3:10 AM  Result Value Ref Range   Sodium 135 135 - 145 mmol/L   Potassium 4.2 3.5 - 5.1 mmol/L   Chloride 103 101 - 111 mmol/L   CO2 22 22 - 32 mmol/L   Glucose, Bld 119 (H) 65 - 99 mg/dL   BUN 34 (H) 6 - 20 mg/dL  Creatinine, Ser 2.05 (H) 0.44 - 1.00 mg/dL   Calcium 8.5 (L) 8.9 - 10.3 mg/dL   Total Protein 7.3 6.5 - 8.1 g/dL   Albumin 2.1 (L) 3.5 - 5.0 g/dL   AST 89 (H) 15 - 41 U/L   ALT 43 14 - 54 U/L   Alkaline Phosphatase 122 38 - 126 U/L   Total Bilirubin 1.3 (H) 0.3 - 1.2 mg/dL   GFR calc non Af Amer 27 (L) >60 mL/min   GFR calc Af Amer 31 (L) >60 mL/min    Comment: (NOTE) The eGFR has been calculated using the CKD EPI equation. This calculation has not been validated in all clinical situations. eGFR's persistently <60 mL/min signify possible Chronic Kidney Disease.    Anion gap 10 5 - 15  Heparin level (unfractionated)     Status: None   Collection Time: 06/15/16  3:40 AM  Result Value Ref Range   Heparin Unfractionated 0.55 0.30 - 0.70 IU/mL    Comment:        IF HEPARIN RESULTS ARE BELOW EXPECTED VALUES, AND PATIENT DOSAGE HAS BEEN CONFIRMED, SUGGEST FOLLOW UP TESTING OF ANTITHROMBIN III LEVELS.   CBC     Status: Abnormal   Collection Time: 06/15/16  3:40 AM  Result Value Ref Range   WBC 11.7 (H) 4.0 - 10.5 K/uL   RBC 2.67 (L) 3.87 - 5.11 MIL/uL   Hemoglobin 8.1 (L) 12.0 - 15.0 g/dL   HCT 24.3 (L) 36.0 - 46.0 %   MCV 91.0 78.0 - 100.0 fL   MCH 30.3 26.0 - 34.0 pg   MCHC 33.3 30.0 - 36.0 g/dL   RDW 14.1 11.5 - 15.5 %   Platelets 208 150 - 400  K/uL  Comprehensive metabolic panel     Status: Abnormal   Collection Time: 06/15/16  3:40 AM  Result Value Ref Range   Sodium 134 (L) 135 - 145 mmol/L   Potassium 3.6 3.5 - 5.1 mmol/L   Chloride 102 101 - 111 mmol/L   CO2 24 22 - 32 mmol/L   Glucose, Bld 134 (H) 65 - 99 mg/dL   BUN 25 (H) 6 - 20 mg/dL   Creatinine, Ser 1.51 (H) 0.44 - 1.00 mg/dL   Calcium 8.6 (L) 8.9 - 10.3 mg/dL   Total Protein 7.0 6.5 - 8.1 g/dL   Albumin 2.0 (L) 3.5 - 5.0 g/dL   AST 95 (H) 15 - 41 U/L   ALT 43 14 - 54 U/L   Alkaline Phosphatase 139 (H) 38 - 126 U/L   Total Bilirubin 2.0 (H) 0.3 - 1.2 mg/dL   GFR calc non Af Amer 38 (L) >60 mL/min   GFR calc Af Amer 44 (L) >60 mL/min    Comment: (NOTE) The eGFR has been calculated using the CKD EPI equation. This calculation has not been validated in all clinical situations. eGFR's persistently <60 mL/min signify possible Chronic Kidney Disease.    Anion gap 8 5 - 15   No results found.     Medical Problem List and Plan: 1.  Debilitation secondary to bilateral lower extremity DVTs with history of prior left CVA with right hemiparesis  -admit to inpatient rehab 2.  DVT Prophylaxis/Anticoagulation: Lovenox/Coumadin. 3. Pain Management: Neurontin 400 mg 3 times a day, Elavil 25 mg daily at bedtime, Cymbalta 20 mg daily Oxycodone as needed 4. Mood: Provide emotional support 5. Neuropsych: This patient is capable of making decisions on her own behalf. 6. Skin/Wound Care: Routine skin checks  7. Fluids/Electrolytes/Nutrition: Routine I&O's with follow-up chemistries 8. Hypertension. Lopressor 100 mg twice a day, clonidine 0.2 mg twice a day. Monitor with increased mobility 9.Seizure disorder.Dilantin 100 mg TID 10.AKI/hyponatremia. Renal ultrasound negative. Follow-up chemistries. 11. Leukocytosis. Latest WBC 11,000. Chest x-ray atelectasis versus pneumonia. Low-grade fever secondary to question pneumonia versus extensive DVTs. Augmentin initiated  06/16/2016  -follow labs and clinically  -constipation may be contributing as well 12. Constipation: no bm since admission to hospital  -bowel clean out upon admission to rehab if no results beforehand     Post Admission Physician Evaluation: 1. Functional deficits secondary  to debility after bilateral DVT's, hx of left CVA. 2. Patient is admitted to receive collaborative, interdisciplinary care between the physiatrist, rehab nursing staff, and therapy team. 3. Patient's level of medical complexity and substantial therapy needs in context of that medical necessity cannot be provided at a lesser intensity of care such as a SNF. 4. Patient has experienced substantial functional loss from his/her baseline which was documented above under the "Functional History" and "Functional Status" headings.  Judging by the patient's diagnosis, physical exam, and functional history, the patient has potential for functional progress which will result in measurable gains while on inpatient rehab.  These gains will be of substantial and practical use upon discharge  in facilitating mobility and self-care at the household level. 5. Physiatrist will provide 24 hour management of medical needs as well as oversight of the therapy plan/treatment and provide guidance as appropriate regarding the interaction of the two. 6. The Preadmission Screening has been reviewed and patient status is unchanged unless otherwise stated above. 7. 24 hour rehab nursing will assist with bladder management, bowel management, safety, skin/wound care, disease management, medication administration, pain management and patient education  and help integrate therapy concepts, techniques,education, etc. 8. PT will assess and treat for/with: Lower extremity strength, range of motion, stamina, balance, functional mobility, safety, adaptive techniques and equipment, NMR, tone mgt, pain control, family education. .   Goals are: mod I to min  assist. 9. OT will assess and treat for/with: ADL's, functional mobility, safety, upper extremity strength, adaptive techniques and equipment, NMR, family ed, egosupport.   Goals are: mod I to min assist. Therapy may proceed with showering this patient. 10. SLP will assess and treat for/with: n/a.  Goals are: n/a. 11. Case Management and Social Worker will assess and treat for psychological issues and discharge planning. 12. Team conference will be held weekly to assess progress toward goals and to determine barriers to discharge. 13. Patient will receive at least 3 hours of therapy per day at least 5 days per week. 14. ELOS: 8-10 days       15. Prognosis:  excellent     [name], MD, [facility] 06/17/2016  Cathlyn Parsons., PA-C 06/15/2016

## 2016-06-16 DIAGNOSIS — R938 Abnormal findings on diagnostic imaging of other specified body structures: Secondary | ICD-10-CM

## 2016-06-16 DIAGNOSIS — M79605 Pain in left leg: Secondary | ICD-10-CM

## 2016-06-16 DIAGNOSIS — I10 Essential (primary) hypertension: Secondary | ICD-10-CM

## 2016-06-16 DIAGNOSIS — R05 Cough: Secondary | ICD-10-CM

## 2016-06-16 LAB — COMPREHENSIVE METABOLIC PANEL
ALBUMIN: 1.8 g/dL — AB (ref 3.5–5.0)
ALK PHOS: 133 U/L — AB (ref 38–126)
ALT: 44 U/L (ref 14–54)
AST: 102 U/L — AB (ref 15–41)
Anion gap: 8 (ref 5–15)
BILIRUBIN TOTAL: 2.8 mg/dL — AB (ref 0.3–1.2)
BUN: 20 mg/dL (ref 6–20)
CALCIUM: 8.6 mg/dL — AB (ref 8.9–10.3)
CO2: 25 mmol/L (ref 22–32)
CREATININE: 1.43 mg/dL — AB (ref 0.44–1.00)
Chloride: 104 mmol/L (ref 101–111)
GFR calc Af Amer: 47 mL/min — ABNORMAL LOW (ref >60)
GFR calc non Af Amer: 41 mL/min — ABNORMAL LOW (ref >60)
GLUCOSE: 126 mg/dL — AB (ref 65–99)
Potassium: 3.6 mmol/L (ref 3.5–5.1)
Sodium: 137 mmol/L (ref 135–145)
TOTAL PROTEIN: 6.8 g/dL (ref 6.5–8.1)

## 2016-06-16 LAB — PROTIME-INR
INR: 1.32
Prothrombin Time: 16.5 seconds — ABNORMAL HIGH (ref 11.4–15.2)

## 2016-06-16 LAB — CULTURE, BLOOD (ROUTINE X 2)
Culture: NO GROWTH
Special Requests: ADEQUATE

## 2016-06-16 MED ORDER — WARFARIN SODIUM 5 MG PO TABS
5.0000 mg | ORAL_TABLET | Freq: Once | ORAL | Status: AC
  Administered 2016-06-16: 5 mg via ORAL
  Filled 2016-06-16: qty 1

## 2016-06-16 MED ORDER — SENNOSIDES-DOCUSATE SODIUM 8.6-50 MG PO TABS
1.0000 | ORAL_TABLET | Freq: Two times a day (BID) | ORAL | Status: DC
  Administered 2016-06-16 – 2016-06-17 (×2): 1 via ORAL
  Filled 2016-06-16 (×2): qty 1

## 2016-06-16 MED ORDER — POLYETHYLENE GLYCOL 3350 17 G PO PACK
17.0000 g | PACK | Freq: Every day | ORAL | Status: DC
  Administered 2016-06-16 – 2016-06-17 (×2): 17 g via ORAL
  Filled 2016-06-16 (×2): qty 1

## 2016-06-16 MED ORDER — ENSURE ENLIVE PO LIQD
237.0000 mL | Freq: Two times a day (BID) | ORAL | Status: DC
  Administered 2016-06-16 – 2016-06-17 (×3): 237 mL via ORAL

## 2016-06-16 MED ORDER — AMOXICILLIN-POT CLAVULANATE 875-125 MG PO TABS
1.0000 | ORAL_TABLET | Freq: Two times a day (BID) | ORAL | Status: DC
  Administered 2016-06-16 – 2016-06-17 (×3): 1 via ORAL
  Filled 2016-06-16 (×3): qty 1

## 2016-06-16 NOTE — Progress Notes (Signed)
[place] for heparin> lovenox/coumadin Indication: DVT  No Known Allergies  Patient Measurements: Height: 5\' 7"  (170.2 cm) Weight: 170 lb (77.1 kg) IBW/kg (Calculated) : 61.6 Heparin Dosing Weight: 77 kg  Vital Signs:    Labs:  Recent Labs  06/14/16 0310 06/15/16 0340 06/16/16 0308  HGB 8.5* 8.1*  --   HCT 26.2* 24.3*  --   PLT 267 208  --   LABPROT  --   --  16.5*  INR  --   --  1.32  HEPARINUNFRC 0.71* 0.55  --   CREATININE 2.05* 1.51* 1.43*  CKTOTAL 75  --   --      Medical History: Past Medical History:  Diagnosis Date  . Hypertension   . Stroke [facility])      Assessment: 56 yo female admitted with lower extremity pain after falling a couple days ago. She is wheelchair bound at baseline. 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 Plans were for Xarelto but there is an interaction with phenytoin.  Phenytoin is a strong CYP3A4 and P-gp inducer and should not be used with DOACs (although there is little data with edoxaban).  Plans are for coumadin/lovenox (day 2 overlap) -SCr= 1.43 (down, CrCl ~ 45) -Hg= 8.1 -baseline INR= 1.32, AST/ALT= 102/44, t bili= 2.8   Goal of Therapy:  Heparin level 0.3-0.7 units/ml Monitor platelets by anticoagulation protocol: Yes    Plan:  -Coumadin 5mg  po today -Daily PT/INR -Lovenox 120mg  [place] daily (~1.5mg /kg/day) -CBC every 3 days  Hildred Laser, Pharm D 06/16/2016 9:58 AM

## 2016-06-16 NOTE — Progress Notes (Signed)
[name] PROGRESS NOTE  Ann Lynch DDU:[id number] DOB: 1960-03-05 DOA: 06/11/2016   PCP: [name], MD     Assessment/Plan: Principal Problem:   Sepsis due to cellulitis [facility]) Active Problems:   AKI (acute kidney injury) ([place])   CVA, old, hemiparesis ([place])   HTN (hypertension)   56 y.o. female with medical history significant of hypertension, CVA with right-sided hemiplegia who was brought to the emergency department with left lower extremity pain after having a fall 2 days ago, followed by drowsiness since yesterday. Patient presented with altered mental status and left leg pain. Admitted for left leg swelling, acute kidney injury, found to have extensive DVT of the left leg.  Assessment and plan Left leg pain,doubt cellulitis or sepsis  Secondary to DVT, lactic acid level wnl.  Started on  vancomycin and Zosyn per pharmacy. Antibiotics have now been discontinued Blood culture showed gram-positive rods, essentially contaminant Leukocytosis   improving Primary issue seems to be extensive DVT      AKI (acute kidney injury) ([place]) presented with a creatinine of 5.22>now 1.51 Hold Micardis HCT. Hyponatremia likely secondary to HCTZ,resolved  Hold the spironolactone in the setting of dehydration. Renal ultrasound shows medical renal disease without any hydronephrosis Renal function is improving. Continue to monitor renal parameters.  Creatinine improving slowly, baseline unknown but probably within normal range based on 06/10/13 CK normal today, IV fluids discontinued, patient encouraged to eat and drink    CVA, old, hemiparesis ([place]) Supportive care. Continue prophylactic phenytoin 100 mg by mouth 3 times a day. Phenytoin level is pending    HTN (hypertension) Well controlled.  Continue metoprolol 100 mg by mouth twice a day.   clonidine 0.2 mg by mouth 2 times a day Monitor blood pressure. Bolus 500 cc as BP soft  Extensive pelvic and B/L LE  DVT's  Left - Positive for acute DVT and age indeterminate DVT in the iliac, common femoral, femoral, and popliteal veins. There is also superficial thromobosis of the saphenofemoral junction and the proximal greater saphenous vein. Right - Positive for acute DVT and age indeterminate DVT of the iliac, common femoral, femoral, and popliteal veins  Patient has been on heparin drip since 5/26. Now transition to [place] Not a candidate for thrombolysis due to renal insufficiency, per vascular surgery Dr. Donnetta Hutching Patient would need outpatient age-appropriate screening for malignancy to be arranged for by PCP   Cough Chest x-ray ordered showed atelectasis vs pneumonia. Pt had low grade temp of 100, could be sec to DVT too, and her wbc count stable around 11,000. Will do a trial of antibiotics with augmentin. Incentive spirometry will be ordered.   DVT prophylaxsis Xarelto  Code Status:  Full code    Family Communication: Discussed in detail with the patient's mother and sister by the bedside , all imaging results, lab results explained to the patient   Disposition Plan:  CIR tomorrow when creatinine improves.     Consultants:  Vascular  Procedures:  None  Antibiotics: Anti-infectives    Start     Dose/Rate Route Frequency Ordered Stop   06/16/16 1045  amoxicillin-clavulanate (AUGMENTIN) 875-125 MG per tablet 1 tablet     1 tablet Oral Every 12 hours 06/16/16 1031     06/13/16 2100  vancomycin (VANCOCIN) IVPB 1000 mg/200 mL premix  Status:  Discontinued     1,000 mg 200 mL/hr over 60 Minutes Intravenous Every 48 hours 06/12/16 0341 06/13/16 0918   06/12/16 0600  piperacillin-tazobactam (ZOSYN) IVPB 2.25 g  Status:  Discontinued     2.25 g 100 mL/hr over 30 Minutes Intravenous Every 8 hours 06/12/16 0137 06/13/16 0918   06/12/16 0000  vancomycin (VANCOCIN) IVPB 1000 mg/200 mL premix     1,000 mg 200 mL/hr over 60 Minutes Intravenous  Once 06/11/16 2258 06/12/16 0131   06/11/16  1745  piperacillin-tazobactam (ZOSYN) IVPB 3.375 g     3.375 g 100 mL/hr over 30 Minutes Intravenous  Once 06/11/16 1741 06/11/16 2034         HPI/Subjective:  Complains of a slight cough,breathing is okay.   Objective: Vitals:   06/14/16 1939 06/15/16 0300 06/15/16 1300 06/15/16 2006  BP: (!) 156/100 (!) 146/98 133/88 133/88  Pulse: (!) 113 (!) 105 (!) 103 (!) 114  Resp: 18 18 18 18   Temp: 98.9 F (37.2 C) 98.9 F (37.2 C) 99.6 F (37.6 C) 100 F (37.8 C)  TempSrc: Oral Oral Oral Oral  SpO2: 100% 99% 99% 97%  Weight:      Height:        Intake/Output Summary (Last 24 hours) at 06/16/16 1043 Last data filed at 06/16/16 0409  Gross per 24 hour  Intake              480 ml  Output              800 ml  Net             -320 ml    Exam: Alert low grade fever comfortable.  Lungs: diminished at bases, no wheezing or rhonchi.  Cardiovascular: Regular rate and rhythm, no murmurs / rubs / gallops. No extremity edema. 2+ pedal pulses. No carotid bruits.  Abdomen: Soft, no tenderness, no masses palpated. No hepatosplenomegaly. Bowel sounds positive.  Musculoskeletal:  swelling of the left lower extremity Skin no rashes Neurologic: CN 2-12 grossly intact. Sensation intact, Right hemiplegia.     Data Reviewed: I have personally reviewed following labs and imaging studies  Micro Results Recent Results (from the past 240 hour(s))  Culture, blood (routine x 2)     Status: None (Preliminary result)   Collection Time: 06/11/16  5:19 PM  Result Value Ref Range Status   Specimen Description BLOOD LEFT ANTECUBITAL  Final   Special Requests   Final    BOTTLES DRAWN AEROBIC AND ANAEROBIC Blood Culture adequate volume   Culture NO GROWTH 4 DAYS  Final   Report Status PENDING  Incomplete  Culture, blood (routine x 2)     Status: None   Collection Time: 06/11/16  5:24 PM  Result Value Ref Range Status   Specimen Description BLOOD LEFT FOREARM  Final   Special Requests   Final     BOTTLES DRAWN AEROBIC AND ANAEROBIC Blood Culture results may not be optimal due to an inadequate volume of blood received in culture bottles   Culture  Setup Time   Final    GRAM POSITIVE RODS AEROBIC BOTTLE ONLY CRITICAL RESULT CALLED TO, READ BACK BY AND VERIFIED WITH: E MARTIN,PHARMD AT 1800 06/13/16 BY L BENFIELD    Culture   Final    GRAM POSITIVE RODS THE SIGNIFICANCE OF ISOLATING THIS ORGANISM FROM A SINGLE SET OF BLOOD CULTURES WHEN MULTIPLE SETS ARE DRAWN IS UNCERTAIN. PLEASE NOTIFY THE MICROBIOLOGY DEPARTMENT WITHIN ONE WEEK IF SPECIATION AND SENSITIVITIES ARE REQUIRED.    Report Status 06/15/2016 FINAL  Final  Blood Culture ID Panel (Reflexed)     Status: None  Collection Time: 06/11/16  5:24 PM  Result Value Ref Range Status   Enterococcus species NOT DETECTED NOT DETECTED Final   Listeria monocytogenes NOT DETECTED NOT DETECTED Final   Staphylococcus species NOT DETECTED NOT DETECTED Final   Staphylococcus aureus NOT DETECTED NOT DETECTED Final   Streptococcus species NOT DETECTED NOT DETECTED Final   Streptococcus agalactiae NOT DETECTED NOT DETECTED Final   Streptococcus pneumoniae NOT DETECTED NOT DETECTED Final   Streptococcus pyogenes NOT DETECTED NOT DETECTED Final   Acinetobacter baumannii NOT DETECTED NOT DETECTED Final   Enterobacteriaceae species NOT DETECTED NOT DETECTED Final   Enterobacter cloacae complex NOT DETECTED NOT DETECTED Final   Escherichia coli NOT DETECTED NOT DETECTED Final   Klebsiella oxytoca NOT DETECTED NOT DETECTED Final   Klebsiella pneumoniae NOT DETECTED NOT DETECTED Final   Proteus species NOT DETECTED NOT DETECTED Final   Serratia marcescens NOT DETECTED NOT DETECTED Final   Haemophilus influenzae NOT DETECTED NOT DETECTED Final   Neisseria meningitidis NOT DETECTED NOT DETECTED Final   Pseudomonas aeruginosa NOT DETECTED NOT DETECTED Final   Candida albicans NOT DETECTED NOT DETECTED Final   Candida glabrata NOT DETECTED NOT  DETECTED Final   Candida krusei NOT DETECTED NOT DETECTED Final   Candida parapsilosis NOT DETECTED NOT DETECTED Final   Candida tropicalis NOT DETECTED NOT DETECTED Final  Urine culture     Status: None   Collection Time: 06/11/16  9:07 PM  Result Value Ref Range Status   Specimen Description URINE, CLEAN CATCH  Final   Special Requests NONE  Final   Culture NO GROWTH  Final   Report Status 06/13/2016 FINAL  Final    Radiology Reports Dg Pelvis 1-2 Views  Result Date: 06/11/2016 CLINICAL DATA:  Recent fall with pelvic pain, initial encounter EXAM: PELVIS - 1-2 VIEW COMPARISON:  None. FINDINGS: Pelvic ring appears intact. The proximal right femur is within normal limits. Some linear bony densities are noted superimposed over the right hip joint likely related to dystrophic bone formation although the possibility of a pelvic fracture in the posterior aspect of the right acetabulum could not be excluded. CT would be helpful for further evaluation. IMPRESSION: Irregular bony density overlying the right hip joint which is likely chronic although the possibility of an acute acetabular fracture deserves consideration. CT of the pelvis is recommended for further evaluation. Electronically Signed   By: [name] M.D.   On: 06/11/2016 18:30   Dg Knee 1-2 Views Left  Result Date: 06/11/2016 CLINICAL DATA:  Altered mental status and recent fall with left knee pain, initial encounter EXAM: LEFT KNEE - 2 VIEW COMPARISON:  None. FINDINGS: No evidence of fracture, dislocation, or joint effusion. No evidence of arthropathy or other focal bone abnormality. Soft tissues are unremarkable. IMPRESSION: No acute abnormality noted. Electronically Signed   By: [name] M.D.   On: 06/11/2016 18:31   Ct Head Wo Contrast  Result Date: 06/11/2016 CLINICAL DATA:  Altered mental status. EXAM: CT HEAD WITHOUT CONTRAST TECHNIQUE: Contiguous axial images were obtained from the base of the skull through the vertex  without intravenous contrast. COMPARISON:  Head CT 06/08/2013 and MRI 06/09/2013 FINDINGS: Brain: A chronic left thalamic infarct is again seen with associated ex vacuo dilatation of the left lateral ventricle and wallerian degeneration along the corticospinal tract. Confluent gliosis extending superiorly into the centrum semiovale is unchanged. Chronic small vessel ischemic changes in the right cerebral white matter are also similar to the prior studies. No acute infarct,  acute intracranial hemorrhage, mass, or extra-axial fluid collection is seen. Vascular: Calcified atherosclerosis at the skullbase, age advanced. No hyperdense vessel. Skull: No fracture or focal osseous lesion. Sinuses/Orbits: Paranasal sinuses and mastoid air cells are clear. Unremarkable orbits. Other: None. IMPRESSION: 1. No evidence of acute intracranial abnormality. 2. Chronic left thalamic infarct and chronic small vessel ischemic disease. Electronically Signed   By: Logan Bores M.D.   On: 06/11/2016 20:56   US Renal  Result Date: 06/12/2016 CLINICAL DATA:  Acute kidney injury, hypertension EXAM: RENAL / URINARY TRACT ULTRASOUND COMPLETE COMPARISON:  06/08/2013 FINDINGS: Right Kidney: Length: 10.2 cm. Normal cortical thickness. Increased cortical echogenicity. No mass, hydronephrosis or shadowing calcification. Left Kidney: Length: 10.8 cm. Normal cortical thickness. Increased cortical echogenicity. No mass, hydronephrosis or shadowing calcification. Small amount of perinephric fluid at inferior pole. Bladder: Appears normal for degree of bladder distention. Incidentally noted minimal ascites adjacent to liver. IMPRESSION: Medical renal disease changes of both kidneys without mass or hydronephrosis. Minimal ascites. Electronically Signed   By: Lavonia Dana M.D.   On: 06/12/2016 10:06   Ct Pelvis Limited Wo Contrast  Result Date: 06/12/2016 CLINICAL DATA:  Left thigh pain after recent fall. Abnormal radiographs. EXAM: CT PELVIS  WITHOUT CONTRAST TECHNIQUE: Multidetector CT imaging of the pelvis was performed following the standard protocol without intravenous contrast. COMPARISON:  Radiographs 06/11/2016. FINDINGS: Urinary Tract: Mild diffuse bladder wall thickening without apparent focal abnormality. The distal ureters are not well visualized, but do not appear dilated. Bowel: No bowel wall thickening, distention or focal surrounding inflammation identified in the pelvis. Vascular/Lymphatic: There are asymmetric mildly enlarged left external iliac and inguinal lymph nodes, measuring up to 12 mm short axis. There is edema surrounding the left external iliac and proximal femoral vessels. The left external iliac and femoral veins appear mildly distended with heterogeneous, generally increased density. Appearance is suspicious for DVT. Mild aortoiliac atherosclerosis noted. Reproductive: The uterus appears unremarkable. No evidence of adnexal mass. Other: Moderate amount of free pelvic fluid. There is nonspecific soft tissue edema throughout the pelvis. Musculoskeletal: No evidence of acute fracture, dislocation or femoral head avascular necrosis. 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 This certainly has the potential to restrict external rotation of the hip. The hip joint spaces are maintained. There are mild sacroiliac degenerative changes bilaterally. There is more advanced facet hypertrophy in the lower lumbar spine, worst on the right at L5-S1. Asymmetric diffuse atrophy of the right hip musculature is noted. IMPRESSION: 1. No acute osseous findings. 2. 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 This may restrict external rotation of the hip and is associated with diffuse right pelvic muscular atrophy. 3. Suspicion of left pelvic and femoral vein DVT. Correlation with venous Doppler recommended. 4. Nonspecific pelvic  edema and mild left inguinal adenopathy. 5. These results will be called to the ordering clinician or representative by the Radiologist Assistant, and communication documented in the PACS or zVision Dashboard. Electronically Signed   By: Richardean Sale M.D.   On: 06/12/2016 11:19   Dg Chest Port 1 View  Result Date: 06/15/2016 CLINICAL DATA:  Intermittently productive cough for a few days. History of hypertension, previous CVA. EXAM: PORTABLE CHEST 1 VIEW COMPARISON:  Chest x-ray of Jun 11, 2016 FINDINGS: The lungs are slightly less well inflated today. There is increased density at both lung bases greatest on the left. The heart is top-normal in size. The pulmonary vascularity is  not engorged. There is tortuosity of the descending thoracic aorta. The bony thorax exhibits no acute abnormality. IMPRESSION: Interval development of atelectasis or pneumonia at the lung bases greatest on the left. Followup PA and lateral chest X-ray is recommended in 3-4 weeks following trial of antibiotic therapy to ensure resolution and exclude underlying malignancy. Electronically Signed   By: David  Martinique M.D.   On: 06/15/2016 15:24   Dg Chest Port 1 View  Result Date: 06/11/2016 CLINICAL DATA:  Altered mental status EXAM: PORTABLE CHEST 1 VIEW COMPARISON:  06/08/2013 FINDINGS: Cardiac shadow is mildly enlarged. The lungs are well aerated bilaterally. No focal infiltrate or sizable effusion is seen. Previously seen masslike densities are less well appreciated on the current exam. IMPRESSION: No acute abnormality seen. Electronically Signed   By: [name] M.D.   On: 06/11/2016 18:28   Dg Femur Min 2 Views Left  Result Date: 06/11/2016 CLINICAL DATA:  Left leg pain following recent fall EXAM: LEFT FEMUR 2 VIEWS COMPARISON:  None. FINDINGS: Degenerative changes of left hip joint are seen. No acute fracture or dislocation is noted. Vascular calcifications are seen. No soft tissue abnormality is noted. IMPRESSION:  Degenerative change without acute abnormality. Electronically Signed   By: [name] M.D.   On: 06/11/2016 18:29     CBC  Recent Labs Lab 06/11/16 1703 06/12/16 0632 06/13/16 0245 06/14/16 0310 06/15/16 0340  WBC 13.2* 12.0* 10.0 11.2* 11.7*  HGB 8.7* 8.4* 7.9* 8.5* 8.1*  HCT 26.8* 25.1* 24.1* 26.2* 24.3*  PLT 171 169 171 267 208  MCV 90.8 90.3 89.9 90.7 91.0  MCH 29.5 30.2 29.5 29.4 30.3  MCHC 32.5 33.5 32.8 32.4 33.3  RDW 13.5 13.5 13.6 13.8 14.1  LYMPHSABS 1.1 0.7 0.8 1.5  --   MONOABS 1.2* 1.4* 1.5* 1.6*  --   EOSABS 0.1 0.1 0.1 0.2  --   BASOSABS 0.0 0.0 0.0 0.0  --     Chemistries   Recent Labs Lab 06/12/16 0632 06/13/16 0245 06/14/16 0310 06/15/16 0340 06/16/16 0308  NA 130* 136 135 134* 137  K 3.2* 4.5 4.2 3.6 3.6  CL 97* 105 103 102 104  CO2 20* 22 22 24 25   GLUCOSE 165* 129* 119* 134* 126*  BUN 57* 43* 34* 25* 20  CREATININE 4.10* 2.92* 2.05* 1.51* 1.43*  CALCIUM 8.2* 8.1* 8.5* 8.6* 8.6*  MG 2.2  --   --   --   --   AST 108* 96* 89* 95* 102*  ALT 48 46 43 43 44  ALKPHOS 106 113 122 139* 133*  BILITOT 1.5* 1.4* 1.3* 2.0* 2.8*   ------------------------------------------------------------------------------------------------------------------ estimated creatinine clearance is 48.7 mL/min (A) (by C-G formula based on SCr of 1.43 mg/dL (H)). ------------------------------------------------------------------------------------------------------------------ No results for input(s): HGBA1C in the last 72 hours. ------------------------------------------------------------------------------------------------------------------ No results for input(s): CHOL, HDL, LDLCALC, TRIG, CHOLHDL, LDLDIRECT in the last 72 hours. ------------------------------------------------------------------------------------------------------------------ No results for input(s): TSH, T4TOTAL, T3FREE, THYROIDAB in the last 72 hours.  Invalid input(s):  FREET3 ------------------------------------------------------------------------------------------------------------------ No results for input(s): VITAMINB12, FOLATE, FERRITIN, TIBC, IRON, RETICCTPCT in the last 72 hours.  Coagulation profile  Recent Labs Lab 06/12/16 1153 06/16/16 0308  INR 1.11 1.32    No results for input(s): DDIMER in the last 72 hours.  Cardiac Enzymes No results for input(s): CKMB, TROPONINI, MYOGLOBIN in the last 168 hours.  Invalid input(s): CK ------------------------------------------------------------------------------------------------------------------ Invalid input(s): POCBNP   CBG:  Recent Labs Lab 06/11/16 1729  GLUCAP 110*  Studies: Dg Chest Port 1 View  Result Date: 06/15/2016 CLINICAL DATA:  Intermittently productive cough for a few days. History of hypertension, previous CVA. EXAM: PORTABLE CHEST 1 VIEW COMPARISON:  Chest x-ray of Jun 11, 2016 FINDINGS: The lungs are slightly less well inflated today. There is increased density at both lung bases greatest on the left. The heart is top-normal in size. The pulmonary vascularity is not engorged. There is tortuosity of the descending thoracic aorta. The bony thorax exhibits no acute abnormality. IMPRESSION: Interval development of atelectasis or pneumonia at the lung bases greatest on the left. Followup PA and lateral chest X-ray is recommended in 3-4 weeks following trial of antibiotic therapy to ensure resolution and exclude underlying malignancy. Electronically Signed   By: David  Martinique M.D.   On: 06/15/2016 15:24      Lab Results  Component Value Date   HGBA1C 5.3 06/09/2013   Lab Results  Component Value Date   LDLCALC 131 (H) 06/09/2013   CREATININE 1.43 (H) 06/16/2016       Scheduled Meds: . amitriptyline  25 mg Oral QHS  . amoxicillin-clavulanate  1 tablet Oral Q12H  . aspirin EC  81 mg Oral Daily  . atorvastatin  40 mg Oral QHS  . cloNIDine  0.2 mg Oral BID  .  DULoxetine  20 mg Oral QHS  . enoxaparin (LOVENOX) injection  120 mg Subcutaneous Q24H  . feeding supplement (NEPRO CARB STEADY)  237 mL Oral BID BM  . folic acid  1 mg Oral Daily  . gabapentin  400 mg Oral TID  . metoprolol tartrate  100 mg Oral BID  . phenytoin  100 mg Oral TID  . warfarin  5 mg Oral ONCE-1800  . Warfarin - Pharmacist Dosing Inpatient   Does not apply q1800   Continuous Infusions:    LOS: 5 days    Time spent: >30 MINS    [facility]  [facility] Pager [phone] If 7PM-7AM, please contact night-coverage at [email or web address], password [facility] 06/16/2016, 10:42 AM  LOS: 5 days

## 2016-06-16 NOTE — PMR Pre-admission (Signed)
PMR Admission Coordinator Pre-Admission Assessment  Patient: Ann Lynch is an 56 y.o., female MRN: [id number] DOB: 1960-08-17 Height: 5\' 7"  (170.2 cm) Weight: 77.1 kg (170 lb)              Insurance Information HMO:     PPO: yes     PCP:      IPA:      80/20:      OTHER: Medicare advantage plan PRIMARY: [employer] Medicare      Policy#: [id number]      Subscriber: pt CM Name: Ann Lynch   Phone#: [phone]    Fax#: [phone] Pre-Cert#: [id number]   Approved for 7 days with f/u with Illene Bolus at phone [phone]  Employer: disabled Benefits:  Phone #: online     Name: 06/15/16 Eff. Date: 01/19/16     Deduct: none      Out of Pocket Max: $4500      Life Max: none CIR: $250 copay per day days 1-6 then covers 100%      SNF: no co pay days 1-20; $164 co pay per day days 21-100 Outpatient: $40 co pay per visit     Co-Pay: visits per medical neccesity Home Health: 100%      Co-Pay: visits per medical neccesity DME: 80%     Co-Pay: 20% Providers: in network  SECONDARY: none      Medicaid Application Date:       Case Manager:  Disability Application Date:       Case Worker:   Emergency Contact Information Contact Information    Name Relation Home Work Sherrill Mother [phone]       Current Medical History  Patient Admitting Diagnosis: 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 CT of the head negative for acute changes. CT of the pelvis showed suspicion of left pelvic and femoral vein DVT. Bilateral lower extremity Doppler showed left-positive acute DVT indeterminate age in the iliac, common femoral, femoral and popliteal veins. Also superficial thrombosis of the  saphenofemoral junction and the proximal greater saphenous vein. Right positive DVT iliac, common femoral, femoral popliteal veins. Placed on Lovenox with bridging transition to Coumadin.. Findings of elevated creatinine 5.22 on admission and sodium 130. Renal ultrasound negative for hydronephrosis. Latest renal function 2015 within normal limits. Myocarditis was held as well as HCTZ. Vascular surgery follow-up for bilateral DVTs not a candidate for thrombo-lysis due to renal insufficiency. Renal function normalizing 1.43.Patient spiked low-grade fever question related to DVTs. WBC 11,700. Chest x-ray showed atelectasis versus pneumonia at the bases greater on the left.CT of the chest showed small right and trace left dependent pleural effusions. Consolidation and volume loss in the dependent right lower lobe favoring compressive atelectasis. Placed on Augmentin 06/16/2016 for empiric coverage.   Past Medical History  Past Medical History:  Diagnosis Date  . Hypertension   . Stroke [facility])     Family History  family history includes Diabetes Mellitus II in her mother; Hypertension in her father and mother.  Prior Rehab/Hospitalizations:  Has the patient had major surgery during 100 days prior to admission? No   Original CVA 2009 and then pt went to rehab. Mom moved pt from [place] to care for her here in [place].  Current Medications  Current Facility-Administered Medications:  .  amitriptyline (ELAVIL) tablet 25 mg, 25 mg, Oral, QHS, Reubin Milan, MD, 25 mg at 06/16/16 2110 .  amoxicillin-clavulanate (AUGMENTIN) 875-125 MG per tablet 1 tablet, 1 tablet, Oral, Q12H, Hosie Poisson, MD, 1 tablet at 06/17/16 0902 .  aspirin EC tablet 81 mg, 81 mg, Oral, Daily, Reubin Milan, MD, 81 mg at 06/17/16 5366 .  atorvastatin (LIPITOR) tablet 40 mg, 40 mg, Oral, QHS, Reubin Milan, MD, 40 mg at 06/16/16 2110 .  cloNIDine (CATAPRES) tablet 0.2 mg, 0.2 mg, Oral, BID, Abrol, Nayana, MD, 0.2 mg  at 06/17/16 0903 .  DULoxetine (CYMBALTA) DR capsule 20 mg, 20 mg, Oral, QHS, Reubin Milan, MD, 20 mg at 06/16/16 2109 .  enoxaparin (LOVENOX) injection 75 mg, 75 mg, Subcutaneous, Q12H, Abrol, Nayana, MD .  feeding supplement (ENSURE ENLIVE) (ENSURE ENLIVE) liquid 237 mL, 237 mL, Oral, BID BM, Hosie Poisson, MD, 237 mL at 06/17/16 0903 .  folic acid (FOLVITE) tablet 1 mg, 1 mg, Oral, Daily, Reubin Milan, MD, 1 mg at 06/17/16 4403 .  gabapentin (NEURONTIN) capsule 400 mg, 400 mg, Oral, TID, Reubin Milan, MD, 400 mg at 06/17/16 0902 .  metoprolol tartrate (LOPRESSOR) tablet 100 mg, 100 mg, Oral, BID, Reubin Milan, MD, 100 mg at 06/17/16 0902 .  ondansetron (ZOFRAN) tablet 4 mg, 4 mg, Oral, Q6H PRN **OR** ondansetron (ZOFRAN) injection 4 mg, 4 mg, Intravenous, Q6H PRN, Reubin Milan, MD .  oxyCODONE-acetaminophen (PERCOCET/ROXICET) 5-325 MG per tablet 1 tablet, 1 tablet, Oral, Q6H PRN, Reubin Milan, MD, 1 tablet at 06/17/16 [phone] .  phenytoin (DILANTIN) ER capsule 100 mg, 100 mg, Oral, TID, Reubin Milan, MD, 100 mg at 06/17/16 0440 .  polyethylene glycol (MIRALAX / GLYCOLAX) packet 17 g, 17 g, Oral, Daily, Hosie Poisson, MD, 17 g at 06/17/16 0902 .  senna-docusate (Senokot-S) tablet 1 tablet, 1 tablet, Oral, BID, Hosie Poisson, MD, 1 tablet at 06/17/16 0903 .  Warfarin - Pharmacist Dosing Inpatient, , Does not apply, q1800, Kris Mouton [facility], Stopped at 06/17/16 1800  Patients Current Diet: Diet Heart Room service appropriate? Yes; Fluid consistency: Thin Diet - low sodium heart healthy  Precautions / Restrictions Precautions Precautions: Fall Precaution Comments: right hemiplegia Restrictions Weight Bearing Restrictions: No   Has the patient had 2 or more falls or a fall with injury in the past year?No  Prior Activity Level Limited Community (1-2x/wk): wheelchair level at home. Mom is legal guardian  Development worker, international aid /  [place] Devices/Equipment: Wheelchair Home Equipment: Environmental consultant - 2 wheels, Bedside commode, Wheelchair - manual, Wheelchair - power, Hospital bed, Hand held shower head   Mom is requesting new hospital bed and wheelchair  Prior Device Use: Indicate devices/aids used by the patient prior to current illness, exacerbation or injury? manual wheelchair. Has electric but pt does not use  Prior Functional Level Prior Function Level of Independence: Needs assistance Gait / Transfers Assistance Needed: non ambulatory only performs stand pivots to the left other than from toilet where she transfers to the right as well ADL's / Homemaking Assistance Needed: Feeds self, does dressing except cannot stand and pull up clothing at same time, can bath self once A'd into tub; can toilet with assist for pants Comments: pt was transferring on her own until approximately 10 days prior to admission when she required help from mom due to weakness and pain.   Self Care: Did the patient need help bathing,  dressing, using the toilet or eating?  Needed some help  Indoor Mobility: Did the patient need assistance with walking from room to room (with or without device)? Needed some help  Stairs: Did the patient need assistance with internal or external stairs (with or without device)? Needed some help  Functional Cognition: Did the patient need help planning regular tasks such as shopping or remembering to take medications? Needed some help  Current Functional Level Cognition  Overall Cognitive Status: Within Functional Limits for tasks assessed Orientation Level: Oriented X4 General Comments: Some of the information she gave [place] was not what her mother gave [place] as far as her PLOF with transfers and clothing management    Extremity Assessment (includes Sensation/Coordination)  Upper Extremity Assessment: Defer to OT evaluation RUE Deficits / Details: Flaccid, 1 finger subluxation  Lower Extremity  Assessment: RLE deficits/detail, Generalized weakness RLE Deficits / Details: baseline hemiplegia    ADLs  Overall ADL's : Needs assistance/impaired Eating/Feeding: Set up (supported sitting) Grooming: Wash/dry hands, Wash/dry face, Set up Grooming Details (indicate cue type and reason): supported sitting Upper Body Bathing: Minimal assistance (supported sitting) Lower Body Bathing: Moderate assistance Lower Body Bathing Details (indicate cue type and reason): supported sit/lateral leans Upper Body Dressing : Minimal assistance Upper Body Dressing Details (indicate cue type and reason): supported sitting Lower Body Dressing: Moderate assistance Lower Body Dressing Details (indicate cue type and reason): S to doff socks, min guard A to don socks; min A for sit>stand and total A to pull up/down clothing Toilet Transfer: Minimal assistance, Control and instrumentation engineer Details (indicate cue type and reason): bed>recliner going to left    Mobility  Overal bed mobility: Needs Assistance Bed Mobility: Supine to Sit Rolling: Mod assist (use or rail) Supine to sit: Min guard, HOB elevated Sit to supine: Mod assist (Need A for RLE and trunk) General bed mobility comments: use of rail and coming out on left side of bed, increased time, momentum, and hooking her RLE with her LLE. pt able to adjust pelvis and problem solve with transfer to perform on her own. She transfers to left at home    Transfers  Overall transfer level: Needs assistance Equipment used: 1 person hand held assist Transfers: Sit to/from Stand, Google Transfers Sit to Stand: Min assist, Mod assist Squat pivot transfers: Min assist General transfer comment: pt able to perform squat pivot to left with min assist for slight assist to rise, pt able to pivot pelvis and guide self to chair on her own. For standing placed chair beside bed with foot rail up on pt's left. Pt grasped rail and pulled to standing x 4 with min A 1st  trial and mod assist repeated 3 trials for rise and anterior translation. Pt stood  15, 5,15, 10 sec respectively with cues for upright posture and min guard for balance other than 3rd trial which required mod assist for balance. Standing limited by left inner thigh pain    Ambulation / Gait / Stairs / Wheelchair Mobility       Posture / Balance Balance Overall balance assessment: Needs assistance Sitting-balance support: No upper extremity supported, Feet supported Sitting balance-Leahy Scale: Fair Postural control: Posterior lean Standing balance support: Single extremity supported Standing balance-Leahy Scale: Poor Standing balance comment: reliant on LUE support and A    Special needs/care consideration BiPAP/CPAP  N/a CPM  N/a Continuous Drip IV  N/a Dialysis  N/a Life Vest  N/a Oxygen  N/a Special Bed  N/a Trach Size  N/a Wound Vac  N/a Skin intact                       Bowel mgmt: continent LBM 06/13/16 Bladder mgmt:external female catheter Diabetic mgmt n/a   Previous Home Environment Living Arrangements:  (and 20 yo sister, Olivia Mackie)  Lives With: Family Available Help at Discharge: Available 24 hours/day, Family Type of Home: House Home Layout: One level Home Access: Ramped entrance Bathroom Shower/Tub: Tub/shower unit, Architectural technologist: Standard Bathroom Accessibility: Yes How Accessible: Accessible via wheelchair Home Care Services: No Additional Comments: per mom the [facility] and hospital bed need to be replaced due to poor condition from wear  Discharge Living Setting Plans for Discharge Living Setting: Lives with (comment) (Mom and sister, Olivia Mackie) Type of Home at Discharge: House Discharge Home Layout: One level Discharge Home Access: [place] entrance Discharge Bathroom Shower/Tub: Tub/shower unit, Curtain Discharge Bathroom Toilet: Standard Discharge Bathroom Accessibility: Yes How Accessible: Accessible via wheelchair Does the patient have any problems  obtaining your medications?: No  Social/Family/Support Systems Patient Roles: Parent (has 3 adult children in [place]) Contact Information: Berenice Primas, Mom Anticipated Caregiver: Mom and sister, Olivia Mackie Anticipated Ambulance person Information: see above Ability/Limitations of Caregiver: Mom can provide upt to min assist Caregiver Availability: 24/7 Discharge Plan Discussed with Primary Caregiver: Yes Is Caregiver In Agreement with Plan?: Yes Does Caregiver/Family have Issues with Lodging/Transportation while Pt is in Rehab?: No   Mom is with pt 24/7. Pt's sister, Olivia Mackie is 36 years old and attends [employer] to become a nurse  Goals/Additional Needs Patient/Family Goal for Rehab: Mod I to min assist with PT and OT at wheelchair level Expected length of stay: ELOS 8-10 days Pt/Family Agrees to Admission and willing to participate: Yes Program Orientation Provided & Reviewed with Pt/Caregiver Including Roles  & Responsibilities: Yes  Decrease burden of Care through IP rehab admission: n/a  Possible need for SNF placement upon discharge: not anticipated  Patient Condition: This patient's medical and functional status has changed since the consult dated: 06/15/2016 in which the Rehabilitation Physician determined and documented that the patient's condition is appropriate for intensive rehabilitative care in an inpatient rehabilitation facility. See "History of Present Illness" (above) for medical update. Functional changes are: overall mod assist transfers. Patient's medical and functional status update has been discussed with the Rehabilitation physician and patient remains appropriate for inpatient rehabilitation. Will admit to inpatient rehab today.  Preadmission Screen Completed By:  Cleatrice Burke, 06/17/2016 11:51 AM ______________________________________________________________________   Discussed status with Dr. Naaman Plummer on 06/17/2016 at  68 and received telephone approval for admission  today.  Admission Coordinator:  Cleatrice Burke, time 8889 Date 06/17/2016

## 2016-06-16 NOTE — Progress Notes (Signed)
I have insurance approval to admit pt to inpt rehab, but noted by Dr. Karleen Hampshire that pt not medically ready for d/c today. I spoke with pt, her Mom and her sister at bedside. They are aware of insurance approval and co pays for insurance coverage for the inpt rehab admit. I will follow up tomorrow. [phone]

## 2016-06-16 NOTE — Care Management Important Message (Signed)
Important Message  Patient Details  Name: Ann Lynch MRN: [id number] Date of Birth: [birth year]   Medicare Important Message Given:  Yes    [name] 06/16/2016, 11:18 AM

## 2016-06-16 NOTE — Discharge Instructions (Signed)
Information on my medicine - Coumadin   (Warfarin)  This medication education was reviewed with me or my healthcare representative as part of my discharge preparation.  The pharmacist that spoke with me during my hospital stay was:  Dareen Piano, [facility]  Why was Coumadin prescribed for you? Coumadin was prescribed for you because you have a blood clot or a medical condition that can cause an increased risk of forming blood clots. Blood clots can cause serious health problems by blocking the flow of blood to the heart, lung, or brain. Coumadin can prevent harmful blood clots from forming. As a reminder your indication for Coumadin is:   Deep Vein Thrombosis Treatment  What test will check on my response to Coumadin? While on Coumadin (warfarin) you will need to have an INR test regularly to ensure that your dose is keeping you in the desired range. The INR (international normalized ratio) number is calculated from the result of the laboratory test called prothrombin time (PT).  If an INR APPOINTMENT HAS NOT ALREADY BEEN MADE FOR YOU please schedule an appointment to have this lab work done by your health care provider within 7 days. Your INR goal is usually a number between:  2 to 3 or your provider may give you a more narrow range like 2-2.5.  Ask your health care provider during an office visit what your goal INR is.  What  do you need to  know  About  COUMADIN? Take Coumadin (warfarin) exactly as prescribed by your healthcare provider about the same time each day.  DO NOT stop taking without talking to the doctor who prescribed the medication.  Stopping without other blood clot prevention medication to take the place of Coumadin may increase your risk of developing a new clot or stroke.  Get refills before you run out.  What do you do if you miss a dose? If you miss a dose, take it as soon as you remember on the same day then continue your regularly scheduled regimen the next day.  Do not  take two doses of Coumadin at the same time.  Important Safety Information A possible side effect of Coumadin (Warfarin) is an increased risk of bleeding. You should call your healthcare provider right away if you experience any of the following: ? Bleeding from an injury or your nose that does not stop. ? Unusual colored urine (red or dark brown) or unusual colored stools (red or black). ? Unusual bruising for unknown reasons. ? A serious fall or if you hit your head (even if there is no bleeding).  Some foods or medicines interact with Coumadin (warfarin) and might alter your response to warfarin. To help avoid this: ? Eat a balanced diet, maintaining a consistent amount of Vitamin K. ? Notify your provider about major diet changes you plan to make. ? Avoid alcohol or limit your intake to 1 drink for women and 2 drinks for men per day. (1 drink is 5 oz. wine, 12 oz. beer, or 1.5 oz. liquor.)  Make sure that ANY health care provider who prescribes medication for you knows that you are taking Coumadin (warfarin).  Also make sure the healthcare provider who is monitoring your Coumadin knows when you have started a new medication including herbals and non-prescription products.  Coumadin (Warfarin)  Major Drug Interactions  Increased Warfarin Effect Decreased Warfarin Effect  Alcohol (large quantities) Antibiotics (esp. Septra/Bactrim, Flagyl, Cipro) Amiodarone (Cordarone) Aspirin (ASA) Cimetidine (Tagamet) Megestrol (Megace) NSAIDs (ibuprofen, naproxen, etc.)  Piroxicam (Feldene) °Propafenone (Rythmol SR) °Propranolol (Inderal) °Isoniazid (INH) °Posaconazole (Noxafil) Barbiturates (Phenobarbital) °Carbamazepine (Tegretol) °Chlordiazepoxide (Librium) °Cholestyramine (Questran) °Griseofulvin °Oral Contraceptives °Rifampin °Sucralfate (Carafate) °Vitamin K  ° °Coumadin® (Warfarin) Major Herbal Interactions  °Increased Warfarin Effect Decreased Warfarin Effect  °Garlic °Ginseng °Ginkgo biloba  Coenzyme Q10 °Green tea °St. John’s wort   ° °Coumadin® (Warfarin) FOOD Interactions  °Eat a consistent number of servings per week of foods HIGH in Vitamin K °(1 serving = ½ cup)  °Collards (cooked, or boiled & drained) °Kale (cooked, or boiled & drained) °Mustard greens (cooked, or boiled & drained) °Parsley *serving size only = ¼ cup °Spinach (cooked, or boiled & drained) °Swiss chard (cooked, or boiled & drained) °Turnip greens (cooked, or boiled & drained)  °Eat a consistent number of servings per week of foods MEDIUM-HIGH in Vitamin K °(1 serving = 1 cup)  °Asparagus (cooked, or boiled & drained) °Broccoli (cooked, boiled & drained, or raw & chopped) °Brussel sprouts (cooked, or boiled & drained) *serving size only = ½ cup °Lettuce, raw (green leaf, endive, romaine) °Spinach, raw °Turnip greens, raw & chopped  ° °These websites have more information on Coumadin (warfarin):  www.coumadin.com; °www.ahrq.gov/consumer/coumadin.htm; ° ° ° °

## 2016-06-17 ENCOUNTER — Inpatient Hospital Stay (HOSPITAL_COMMUNITY)
Admission: RE | Admit: 2016-06-17 | Discharge: 2016-06-27 | DRG: 057 | Disposition: A | Discharge status: Home Health | Payer: Medicare HMO | Source: Intra-hospital | Attending: Physical Medicine & Rehabilitation | Admitting: Physical Medicine & Rehabilitation

## 2016-06-17 ENCOUNTER — Inpatient Hospital Stay (HOSPITAL_COMMUNITY): Payer: Medicare HMO

## 2016-06-17 DIAGNOSIS — Z7982 Long term (current) use of aspirin: Secondary | ICD-10-CM

## 2016-06-17 DIAGNOSIS — Z993 Dependence on wheelchair: Secondary | ICD-10-CM

## 2016-06-17 DIAGNOSIS — R5381 Other malaise: Secondary | ICD-10-CM | POA: Diagnosis present

## 2016-06-17 DIAGNOSIS — I119 Hypertensive heart disease without heart failure: Secondary | ICD-10-CM | POA: Diagnosis present

## 2016-06-17 DIAGNOSIS — A419 Sepsis, unspecified organism: Secondary | ICD-10-CM | POA: Diagnosis not present

## 2016-06-17 DIAGNOSIS — I10 Essential (primary) hypertension: Secondary | ICD-10-CM

## 2016-06-17 DIAGNOSIS — R791 Abnormal coagulation profile: Secondary | ICD-10-CM

## 2016-06-17 DIAGNOSIS — N390 Urinary tract infection, site not specified: Secondary | ICD-10-CM | POA: Diagnosis present

## 2016-06-17 DIAGNOSIS — I69351 Hemiplegia and hemiparesis following cerebral infarction affecting right dominant side: Principal | ICD-10-CM

## 2016-06-17 DIAGNOSIS — L0390 Cellulitis, unspecified: Secondary | ICD-10-CM | POA: Diagnosis not present

## 2016-06-17 DIAGNOSIS — K5900 Constipation, unspecified: Secondary | ICD-10-CM | POA: Diagnosis present

## 2016-06-17 DIAGNOSIS — I82413 Acute embolism and thrombosis of femoral vein, bilateral: Secondary | ICD-10-CM | POA: Diagnosis not present

## 2016-06-17 DIAGNOSIS — I69359 Hemiplegia and hemiparesis following cerebral infarction affecting unspecified side: Secondary | ICD-10-CM | POA: Diagnosis not present

## 2016-06-17 DIAGNOSIS — Z79899 Other long term (current) drug therapy: Secondary | ICD-10-CM | POA: Diagnosis not present

## 2016-06-17 DIAGNOSIS — I69959 Hemiplegia and hemiparesis following unspecified cerebrovascular disease affecting unspecified side: Secondary | ICD-10-CM | POA: Diagnosis not present

## 2016-06-17 DIAGNOSIS — D62 Acute posthemorrhagic anemia: Secondary | ICD-10-CM | POA: Diagnosis not present

## 2016-06-17 DIAGNOSIS — J90 Pleural effusion, not elsewhere classified: Secondary | ICD-10-CM | POA: Diagnosis not present

## 2016-06-17 DIAGNOSIS — G894 Chronic pain syndrome: Secondary | ICD-10-CM | POA: Diagnosis not present

## 2016-06-17 DIAGNOSIS — I82409 Acute embolism and thrombosis of unspecified deep veins of unspecified lower extremity: Secondary | ICD-10-CM | POA: Diagnosis not present

## 2016-06-17 DIAGNOSIS — N179 Acute kidney failure, unspecified: Secondary | ICD-10-CM | POA: Diagnosis present

## 2016-06-17 DIAGNOSIS — I82403 Acute embolism and thrombosis of unspecified deep veins of lower extremity, bilateral: Secondary | ICD-10-CM

## 2016-06-17 DIAGNOSIS — D72829 Elevated white blood cell count, unspecified: Secondary | ICD-10-CM | POA: Diagnosis not present

## 2016-06-17 DIAGNOSIS — Z86718 Personal history of other venous thrombosis and embolism: Secondary | ICD-10-CM | POA: Diagnosis not present

## 2016-06-17 DIAGNOSIS — R569 Unspecified convulsions: Secondary | ICD-10-CM

## 2016-06-17 DIAGNOSIS — R109 Unspecified abdominal pain: Secondary | ICD-10-CM | POA: Diagnosis not present

## 2016-06-17 DIAGNOSIS — E871 Hypo-osmolality and hyponatremia: Secondary | ICD-10-CM | POA: Diagnosis present

## 2016-06-17 DIAGNOSIS — G40909 Epilepsy, unspecified, not intractable, without status epilepticus: Secondary | ICD-10-CM | POA: Diagnosis present

## 2016-06-17 LAB — CBC
HCT: 22.7 % — ABNORMAL LOW (ref 36.0–46.0)
Hemoglobin: 7.4 g/dL — ABNORMAL LOW (ref 12.0–15.0)
MCH: 29.6 pg (ref 26.0–34.0)
MCHC: 32.6 g/dL (ref 30.0–36.0)
MCV: 90.8 fL (ref 78.0–100.0)
PLATELETS: 292 10*3/uL (ref 150–400)
RBC: 2.5 MIL/uL — AB (ref 3.87–5.11)
RDW: 14 % (ref 11.5–15.5)
WBC: 14.1 10*3/uL — AB (ref 4.0–10.5)

## 2016-06-17 LAB — PROTIME-INR
INR: 3
Prothrombin Time: 31.8 seconds — ABNORMAL HIGH (ref 11.4–15.2)

## 2016-06-17 LAB — CULTURE, BLOOD (ROUTINE X 2)

## 2016-06-17 MED ORDER — POLYETHYLENE GLYCOL 3350 17 G PO PACK: 17.0000 g | PACK | Freq: Every day | ORAL | 0 refills | Status: DC

## 2016-06-17 MED ORDER — FOLIC ACID 1 MG PO TABS
1.0000 mg | ORAL_TABLET | Freq: Every day | ORAL | Status: DC
  Administered 2016-06-18 – 2016-06-27 (×10): 1 mg via ORAL
  Filled 2016-06-17 (×10): qty 1

## 2016-06-17 MED ORDER — ONDANSETRON HCL 4 MG PO TABS: 4.0000 mg | ORAL_TABLET | Freq: Four times a day (QID) | ORAL | Status: DC | PRN

## 2016-06-17 MED ORDER — GABAPENTIN 400 MG PO CAPS
400.0000 mg | ORAL_CAPSULE | Freq: Three times a day (TID) | ORAL | Status: DC
  Administered 2016-06-17 – 2016-06-27 (×31): 400 mg via ORAL
  Filled 2016-06-17 (×31): qty 1

## 2016-06-17 MED ORDER — AMOXICILLIN-POT CLAVULANATE 875-125 MG PO TABS: 1.0000 | ORAL_TABLET | Freq: Two times a day (BID) | ORAL | 0 refills | Status: DC

## 2016-06-17 MED ORDER — ENSURE ENLIVE PO LIQD: 237.0000 mL | Freq: Two times a day (BID) | ORAL | 12 refills | Status: AC

## 2016-06-17 MED ORDER — WARFARIN SODIUM 5 MG PO TABS: 5.0000 mg | ORAL_TABLET | Freq: Every day | ORAL | 11 refills | Status: DC

## 2016-06-17 MED ORDER — ONDANSETRON HCL 4 MG/2ML IJ SOLN: 4.0000 mg | Freq: Four times a day (QID) | INTRAMUSCULAR | Status: DC | PRN

## 2016-06-17 MED ORDER — METOPROLOL TARTRATE 50 MG PO TABS
100.0000 mg | ORAL_TABLET | Freq: Two times a day (BID) | ORAL | Status: DC
  Administered 2016-06-17 – 2016-06-27 (×20): 100 mg via ORAL
  Filled 2016-06-17 (×20): qty 2

## 2016-06-17 MED ORDER — OXYCODONE-ACETAMINOPHEN 5-325 MG PO TABS: 1.0000 | ORAL_TABLET | Freq: Four times a day (QID) | ORAL | 0 refills | Status: DC | PRN

## 2016-06-17 MED ORDER — OXYCODONE-ACETAMINOPHEN 5-325 MG PO TABS
1.0000 | ORAL_TABLET | Freq: Four times a day (QID) | ORAL | Status: DC | PRN
  Administered 2016-06-17 – 2016-06-18 (×2): 1 via ORAL
  Filled 2016-06-17 (×2): qty 1

## 2016-06-17 MED ORDER — ATORVASTATIN CALCIUM 40 MG PO TABS
40.0000 mg | ORAL_TABLET | Freq: Every day | ORAL | Status: DC
  Administered 2016-06-17 – 2016-06-26 (×10): 40 mg via ORAL
  Filled 2016-06-17 (×10): qty 1

## 2016-06-17 MED ORDER — ENOXAPARIN SODIUM 80 MG/0.8ML ~~LOC~~ SOLN
75.0000 mg | Freq: Two times a day (BID) | SUBCUTANEOUS | Status: DC
  Administered 2016-06-17: 75 mg via SUBCUTANEOUS
  Filled 2016-06-17: qty 0.8

## 2016-06-17 MED ORDER — AMOXICILLIN-POT CLAVULANATE 875-125 MG PO TABS
1.0000 | ORAL_TABLET | Freq: Two times a day (BID) | ORAL | Status: DC
  Administered 2016-06-17 – 2016-06-19 (×4): 1 via ORAL
  Filled 2016-06-17 (×4): qty 1

## 2016-06-17 MED ORDER — ENOXAPARIN SODIUM 80 MG/0.8ML ~~LOC~~ SOLN
75.0000 mg | Freq: Two times a day (BID) | SUBCUTANEOUS | Status: DC
  Administered 2016-06-18: 75 mg via SUBCUTANEOUS
  Filled 2016-06-17: qty 0.8

## 2016-06-17 MED ORDER — SORBITOL 70 % SOLN
30.0000 mL | Freq: Every day | Status: DC | PRN
  Administered 2016-06-18: 30 mL via ORAL
  Filled 2016-06-17: qty 30

## 2016-06-17 MED ORDER — DULOXETINE HCL 20 MG PO CPEP
20.0000 mg | ORAL_CAPSULE | Freq: Every day | ORAL | Status: DC
  Administered 2016-06-17 – 2016-06-26 (×10): 20 mg via ORAL
  Filled 2016-06-17 (×11): qty 1

## 2016-06-17 MED ORDER — AMITRIPTYLINE HCL 25 MG PO TABS
25.0000 mg | ORAL_TABLET | Freq: Every day | ORAL | Status: DC
  Administered 2016-06-17 – 2016-06-26 (×10): 25 mg via ORAL
  Filled 2016-06-17 (×10): qty 1

## 2016-06-17 MED ORDER — ENOXAPARIN SODIUM 80 MG/0.8ML ~~LOC~~ SOLN: 75.0000 mg | Freq: Two times a day (BID) | SUBCUTANEOUS | 0 refills | Status: DC

## 2016-06-17 MED ORDER — SENNOSIDES-DOCUSATE SODIUM 8.6-50 MG PO TABS
1.0000 | ORAL_TABLET | Freq: Two times a day (BID) | ORAL | Status: DC
  Administered 2016-06-17 – 2016-06-25 (×13): 1 via ORAL
  Filled 2016-06-17 (×20): qty 1

## 2016-06-17 MED ORDER — ASPIRIN EC 81 MG PO TBEC
81.0000 mg | DELAYED_RELEASE_TABLET | Freq: Every day | ORAL | Status: DC
  Administered 2016-06-18 – 2016-06-27 (×10): 81 mg via ORAL
  Filled 2016-06-17 (×11): qty 1

## 2016-06-17 MED ORDER — SENNOSIDES-DOCUSATE SODIUM 8.6-50 MG PO TABS: 1.0000 | ORAL_TABLET | Freq: Two times a day (BID) | ORAL | 1 refills | Status: DC

## 2016-06-17 MED ORDER — WARFARIN - PHARMACIST DOSING INPATIENT
Freq: Every day | Status: DC
  Administered 2016-06-22: 18:00:00

## 2016-06-17 MED ORDER — PHENYTOIN SODIUM EXTENDED 100 MG PO CAPS
100.0000 mg | ORAL_CAPSULE | Freq: Three times a day (TID) | ORAL | Status: DC
  Administered 2016-06-17 – 2016-06-27 (×30): 100 mg via ORAL
  Filled 2016-06-17 (×30): qty 1

## 2016-06-17 MED ORDER — POLYETHYLENE GLYCOL 3350 17 G PO PACK
17.0000 g | PACK | Freq: Every day | ORAL | Status: DC
  Administered 2016-06-18 – 2016-06-23 (×5): 17 g via ORAL
  Filled 2016-06-17 (×10): qty 1

## 2016-06-17 MED ORDER — ENSURE ENLIVE PO LIQD
237.0000 mL | Freq: Two times a day (BID) | ORAL | Status: DC
  Administered 2016-06-18 – 2016-06-25 (×12): 237 mL via ORAL

## 2016-06-17 MED ORDER — SPIRONOLACTONE 25 MG PO TABS: 25.0000 mg | ORAL_TABLET | Freq: Three times a day (TID) | ORAL | 1 refills | Status: DC

## 2016-06-17 MED ORDER — CLONIDINE HCL 0.2 MG PO TABS
0.2000 mg | ORAL_TABLET | Freq: Two times a day (BID) | ORAL | Status: DC
  Administered 2016-06-17 – 2016-06-20 (×6): 0.2 mg via ORAL
  Filled 2016-06-17 (×6): qty 1

## 2016-06-17 NOTE — H&P (Signed)
Physical Medicine and Rehabilitation Admission H&P       Chief Complaint  Patient presents with  . Fall  . 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 CT of the head negative for acute changes. CT of the pelvis showed suspicion of left pelvic and femoral vein DVT. Bilateral lower extremity Doppler showed left-positive acute DVT indeterminate age in the iliac, common femoral, femoral and popliteal veins. Also superficial thrombosis of the saphenofemoral junction and the proximal greater saphenous vein. Right positive DVT iliac, common femoral, femoral popliteal veins. Placed on Lovenox with bridging transition to Coumadin.. Findings of elevated creatinine 5.22 on admission and sodium 130. Renal ultrasound negative for hydronephrosis. Latest renal function 2015 within normal limits. Myocarditis was held as well as HCTZ. Vascular surgery follow-up for bilateral DVTs not a candidate for thrombo-lysis due to renal insufficiency. Renal function normalizing 1.43.Patient spiked low-grade fever question related to DVTs. WBC 11,700. Chest x-ray showed atelectasis versus pneumonia at the bases greater on the left.CT of the chest showed small right and trace left dependent pleural effusions. Consolidation and volume loss in the dependent right lower lobe favoring compressive atelectasis. Placed on Augmentin 06/16/2016 for empiric coverage. Physical and occupational therapy evaluation completed 06/15/2016 with recommendations of physical medicine rehabilitation consult. Patient was admitted for a comprehensive rehabilitation program  Review  of Systems  Constitutional: Positive for malaise/fatigue. Negative for chills and fever.       Right sided weakness  HENT: Negative for hearing loss.   Eyes: Negative for blurred vision and double vision.  Respiratory: Negative for cough and shortness of breath.   Cardiovascular: Positive for leg swelling. Negative for chest pain and palpitations.  Gastrointestinal: Positive for constipation and nausea. Negative for vomiting.  Genitourinary: Negative for dysuria, flank pain and hematuria.  Musculoskeletal: Positive for joint pain and myalgias.  Skin: Negative for rash.  Neurological: Positive for speech change and seizures.  All other systems reviewed and are negative.      Past Medical History:  Diagnosis Date  . Hypertension   . Stroke [facility])         Past Surgical History:  Procedure Laterality Date  . TRACHEOSTOMY          Family History  Problem Relation Age of Onset  . Hypertension Mother   . Diabetes Mellitus II Mother   . Hypertension Father    Social History:  reports that she has never smoked. She has never used smokeless tobacco. She reports that she does not drink alcohol. Her drug history is not on file. Allergies: No Known Allergies       Medications Prior to Admission  Medication Sig Dispense Refill  . amitriptyline (ELAVIL) 25 MG tablet Take 25 mg by mouth at bedtime.    Marland Kitchen aspirin EC 81 MG tablet Take 81 mg by mouth daily.    Marland Kitchen atorvastatin (LIPITOR) 40 MG tablet Take 40 mg by mouth at bedtime.    . cloNIDine (CATAPRES) 0.3 MG tablet Take 1 tablet (0.3 mg total) by mouth 4 (four) times daily. (Patient taking differently: Take 0.2 mg by mouth 4 (four) times daily. ) 120 tablet 0  . DULoxetine (CYMBALTA) 20 MG capsule Take 20 mg by mouth  at bedtime.     . folic acid (FOLVITE) 1 MG tablet Take 1 mg by mouth daily.    Marland Kitchen gabapentin (NEURONTIN) 400 MG capsule Take 400 mg by mouth 3 (three) times daily.    . metoprolol (LOPRESSOR) 100 MG  tablet Take 100 mg by mouth 2 (two) times daily.    . phenytoin (DILANTIN) 100 MG ER capsule Take 100 mg by mouth 3 (three) times daily.     Marland Kitchen spironolactone (ALDACTONE) 25 MG tablet Take 25 mg by mouth 3 (three) times daily.    Marland Kitchen telmisartan-hydrochlorothiazide (MICARDIS HCT) 80-12.5 MG per tablet Take 1 tablet by mouth daily.      Home: Home Living Family/patient expects to be discharged to:: Inpatient rehab Living Arrangements: Parent Available Help at Discharge: Available 24 hours/day, Family Type of Home: House Home Access: Ramped entrance [place]: One level Bathroom Shower/Tub: Tub/shower unit, Architectural technologist: Standard Home Equipment: Environmental consultant - 2 wheels, Bedside commode, Wheelchair - manual, Wheelchair - power, Hospital bed, Hand held shower head Additional Comments: per mom the [facility] and hospital bed need to be replaced due to poor condition from wear   Functional History: Prior Function Level of Independence: Needs assistance Gait / Transfers Assistance Needed: non ambulatory only performs stand pivots to the left other than from toilet where she transfers to the right as well ADL's / Homemaking Assistance Needed: Feeds self, does dressing except cannot stand and pull up clothing at same time, can bath self once A'd into tub; can toilet with assist for pants Comments: pt was transferring on her own until approximately 10 days prior to admission when she required help from mom due to weakness and pain.   Functional Status:  Mobility: Bed Mobility Overal bed mobility: Needs Assistance Bed Mobility: Supine to Sit Rolling: Mod assist (use or rail) Supine to sit: Min guard, HOB elevated Sit to supine: Mod assist (Need A for RLE and trunk) General bed mobility comments: use of rail and coming out on left side of bed, increased time, momentum, and hooking her RLE with her LLE. pt able to adjust pelvis and problem solve with transfer to perform on her own. She  transfers to left at home Transfers Overall transfer level: Needs assistance Equipment used: 1 person hand held assist Transfers: Sit to/from Stand, [employer] Transfers Sit to Stand: Min assist, Mod assist Squat pivot transfers: Min assist General transfer comment: For sit<>stand; 4 times. Min A from bed, min A from recliner with chair in front of her; Mod A from recliner holding onto sink on her left side and sink in front of her. Pt with tendency with posterior lean and right knee going into extension with sit>stand (verbal cues to lean more foreward and A to block RLE)--per mother with sit>stand pt usually has her RLE behind her LLE with sit to stand  ADL: ADL Overall ADL's : Needs assistance/impaired Eating/Feeding: Set up (supported sitting) Grooming: Wash/dry hands, Wash/dry face, Set up Grooming Details (indicate cue type and reason): supported sitting Upper Body Bathing: Minimal assistance (supported sitting) Lower Body Bathing: Moderate assistance Lower Body Bathing Details (indicate cue type and reason): supported sit/lateral leans Upper Body Dressing : Minimal assistance Upper Body Dressing Details (indicate cue type and reason): supported sitting Lower Body Dressing: Moderate assistance Lower Body Dressing Details (indicate cue type and reason): S to doff socks, min guard A to don socks; min A for sit>stand and total A to pull up/down clothing Toilet Transfer: Minimal assistance, Squat-pivot  Toilet Transfer Details (indicate cue type and reason): bed>recliner going to left  Cognition: Cognition Overall Cognitive Status: Within Functional Limits for tasks assessed Orientation Level: Oriented X4 Cognition Arousal/Alertness: Awake/alert Behavior During Therapy: Flat affect Overall Cognitive Status: Within Functional Limits for tasks assessed General Comments: Some of the information she gave [place] was not what her mother gave [place] as far as her PLOF with transfers and  clothing management  Physical Exam: Blood pressure 133/88, pulse (!) 103, temperature 99.6 F (37.6 C), temperature source Oral, resp. rate 18, height 5' 7"  (1.702 m), weight 77.1 kg (170 lb), SpO2 99 %. Physical Exam  Constitutional:  56 year old right-handed female appearing older than stated age  Eyes:  Pupils reactive to light  Neck: Normal range of motion. Neck supple. No thyromegaly present.  Cardiovascular: Normal rate and regular rhythm.   Respiratory:  Decreased breath sounds at the bases but clear to auscultation  GI: She exhibits distension. There is no tenderness.  Bowel sounds scarce  Skin. Warm and dry Musculoskeletal: She exhibits edema.  Bilateral LE edema left more affected than right. Both legs with minimal tenderness. Neurological: She is alert.  Patient is oriented to person, place and time. She provides her date of birth. Fair awareness of deficits. Dense RUE hemiparesis. RLE 1-2/5 prox to distal with extensor pattern. Dysconjugate gaze. 1-2/4 tone RLE. 3 beats clonus right ankle Psych: pt cooperative but flat   Lab Results Last 48 Hours        Results for orders placed or performed during the hospital encounter of 06/11/16 (from the past 48 hour(s))  CBC with Differential     Status: Abnormal   Collection Time: 06/14/16  3:10 AM  Result Value Ref Range   WBC 11.2 (H) 4.0 - 10.5 K/uL   RBC 2.89 (L) 3.87 - 5.11 MIL/uL   Hemoglobin 8.5 (L) 12.0 - 15.0 g/dL   HCT 26.2 (L) 36.0 - 46.0 %   MCV 90.7 78.0 - 100.0 fL   MCH 29.4 26.0 - 34.0 pg   MCHC 32.4 30.0 - 36.0 g/dL   RDW 13.8 11.5 - 15.5 %   Platelets 267 150 - 400 K/uL   Neutrophils Relative % 71 %   Neutro Abs 7.9 (H) 1.7 - 7.7 K/uL   Lymphocytes Relative 13 %   Lymphs Abs 1.5 0.7 - 4.0 K/uL   Monocytes Relative 14 %   Monocytes Absolute 1.6 (H) 0.1 - 1.0 K/uL   Eosinophils Relative 2 %   Eosinophils Absolute 0.2 0.0 - 0.7 K/uL   Basophils Relative 0 %   Basophils Absolute  0.0 0.0 - 0.1 K/uL  CK     Status: None   Collection Time: 06/14/16  3:10 AM  Result Value Ref Range   Total CK 75 38 - 234 U/L  Heparin level (unfractionated)     Status: Abnormal   Collection Time: 06/14/16  3:10 AM  Result Value Ref Range   Heparin Unfractionated 0.71 (H) 0.30 - 0.70 IU/mL    Comment:        IF HEPARIN RESULTS ARE BELOW EXPECTED VALUES, AND PATIENT DOSAGE HAS BEEN CONFIRMED, SUGGEST FOLLOW UP TESTING OF ANTITHROMBIN III LEVELS.   Comprehensive metabolic panel     Status: Abnormal   Collection Time: 06/14/16  3:10 AM  Result Value Ref Range   Sodium 135 135 - 145 mmol/L   Potassium 4.2 3.5 - 5.1 mmol/L   Chloride 103 101 - 111 mmol/L   CO2 22 22 -  32 mmol/L   Glucose, Bld 119 (H) 65 - 99 mg/dL   BUN 34 (H) 6 - 20 mg/dL   Creatinine, Ser 2.05 (H) 0.44 - 1.00 mg/dL   Calcium 8.5 (L) 8.9 - 10.3 mg/dL   Total Protein 7.3 6.5 - 8.1 g/dL   Albumin 2.1 (L) 3.5 - 5.0 g/dL   AST 89 (H) 15 - 41 U/L   ALT 43 14 - 54 U/L   Alkaline Phosphatase 122 38 - 126 U/L   Total Bilirubin 1.3 (H) 0.3 - 1.2 mg/dL   GFR calc non Af Amer 27 (L) >60 mL/min   GFR calc Af Amer 31 (L) >60 mL/min    Comment: (NOTE) The eGFR has been calculated using the CKD EPI equation. This calculation has not been validated in all clinical situations. eGFR's persistently <60 mL/min signify possible Chronic Kidney Disease.    Anion gap 10 5 - 15  Heparin level (unfractionated)     Status: None   Collection Time: 06/15/16  3:40 AM  Result Value Ref Range   Heparin Unfractionated 0.55 0.30 - 0.70 IU/mL    Comment:        IF HEPARIN RESULTS ARE BELOW EXPECTED VALUES, AND PATIENT DOSAGE HAS BEEN CONFIRMED, SUGGEST FOLLOW UP TESTING OF ANTITHROMBIN III LEVELS.   CBC     Status: Abnormal   Collection Time: 06/15/16  3:40 AM  Result Value Ref Range   WBC 11.7 (H) 4.0 - 10.5 K/uL   RBC 2.67 (L) 3.87 - 5.11 MIL/uL   Hemoglobin 8.1 (L) 12.0 - 15.0 g/dL    HCT 24.3 (L) 36.0 - 46.0 %   MCV 91.0 78.0 - 100.0 fL   MCH 30.3 26.0 - 34.0 pg   MCHC 33.3 30.0 - 36.0 g/dL   RDW 14.1 11.5 - 15.5 %   Platelets 208 150 - 400 K/uL  Comprehensive metabolic panel     Status: Abnormal   Collection Time: 06/15/16  3:40 AM  Result Value Ref Range   Sodium 134 (L) 135 - 145 mmol/L   Potassium 3.6 3.5 - 5.1 mmol/L   Chloride 102 101 - 111 mmol/L   CO2 24 22 - 32 mmol/L   Glucose, Bld 134 (H) 65 - 99 mg/dL   BUN 25 (H) 6 - 20 mg/dL   Creatinine, Ser 1.51 (H) 0.44 - 1.00 mg/dL   Calcium 8.6 (L) 8.9 - 10.3 mg/dL   Total Protein 7.0 6.5 - 8.1 g/dL   Albumin 2.0 (L) 3.5 - 5.0 g/dL   AST 95 (H) 15 - 41 U/L   ALT 43 14 - 54 U/L   Alkaline Phosphatase 139 (H) 38 - 126 U/L   Total Bilirubin 2.0 (H) 0.3 - 1.2 mg/dL   GFR calc non Af Amer 38 (L) >60 mL/min   GFR calc Af Amer 44 (L) >60 mL/min    Comment: (NOTE) The eGFR has been calculated using the CKD EPI equation. This calculation has not been validated in all clinical situations. eGFR's persistently <60 mL/min signify possible Chronic Kidney Disease.    Anion gap 8 5 - 15     Imaging Results (Last 48 hours)  No results found.       Medical Problem List and Plan: 1.  Debilitation secondary to bilateral lower extremity DVTs with history of prior left CVA with right hemiparesis             -admit to inpatient rehab 2.  DVT Prophylaxis/Anticoagulation: Lovenox/Coumadin. 3. Pain Management:  Neurontin 400 mg 3 times a day, Elavil 25 mg daily at bedtime, Cymbalta 20 mg daily Oxycodone as needed 4. Mood: Provide emotional support 5. Neuropsych: This patient is capable of making decisions on her own behalf. 6. Skin/Wound Care: Routine skin checks 7. Fluids/Electrolytes/Nutrition: Routine I&O's with follow-up chemistries 8. Hypertension. Lopressor 100 mg twice a day, clonidine 0.2 mg twice a day. Monitor with increased mobility 9.Seizure disorder.Dilantin 100 mg  TID 10.AKI/hyponatremia. Renal ultrasound negative. Follow-up chemistries. 11. Leukocytosis. Latest WBC 11,000. Chest x-ray atelectasis versus pneumonia. Low-grade fever secondary to question pneumonia versus extensive DVTs. Augmentin initiated 06/16/2016             -follow labs and clinically             -constipation may be contributing as well 12. Constipation: no bm since admission to hospital             -bowel clean out upon admission to rehab if no results beforehand     Post Admission Physician Evaluation: 1. Functional deficits secondary  to debility after bilateral DVT's, hx of left CVA. 2. Patient is admitted to receive collaborative, interdisciplinary care between the physiatrist, rehab nursing staff, and therapy team. 3. Patient's level of medical complexity and substantial therapy needs in context of that medical necessity cannot be provided at a lesser intensity of care such as a SNF. 4. Patient has experienced substantial functional loss from his/her baseline which was documented above under the "Functional History" and "Functional Status" headings.  Judging by the patient's diagnosis, physical exam, and functional history, the patient has potential for functional progress which will result in measurable gains while on inpatient rehab.  These gains will be of substantial and practical use upon discharge  in facilitating mobility and self-care at the household level. 5. Physiatrist will provide 24 hour management of medical needs as well as oversight of the therapy plan/treatment and provide guidance as appropriate regarding the interaction of the two. 6. The Preadmission Screening has been reviewed and patient status is unchanged unless otherwise stated above. 7. 24 hour rehab nursing will assist with bladder management, bowel management, safety, skin/wound care, disease management, medication administration, pain management and patient education  and help integrate therapy  concepts, techniques,education, etc. 8. PT will assess and treat for/with: Lower extremity strength, range of motion, stamina, balance, functional mobility, safety, adaptive techniques and equipment, NMR, tone mgt, pain control, family education. .   Goals are: mod I to min assist. 9. OT will assess and treat for/with: ADL's, functional mobility, safety, upper extremity strength, adaptive techniques and equipment, NMR, family ed, egosupport.   Goals are: mod I to min assist. Therapy may proceed with showering this patient. 10. SLP will assess and treat for/with: n/a.  Goals are: n/a. 11. Case Management and Social Worker will assess and treat for psychological issues and discharge planning. 12. Team conference will be held weekly to assess progress toward goals and to determine barriers to discharge. 13. Patient will receive at least 3 hours of therapy per day at least 5 days per week. 14. ELOS: 8-10 days       15. Prognosis:  excellent     [name], MD, [facility] 06/17/2016  Cathlyn Parsons., PA-C 06/15/2016

## 2016-06-17 NOTE — Progress Notes (Signed)
Ann Gong, RN Rehab Admission Coordinator Signed Physical Medicine and Rehabilitation  PMR Pre-admission Date of Service: 06/16/2016 1:17 PM  Related encounter: ED to Hosp-Admission (Current) from 06/11/2016 in [place]       [] Hide copied text PMR Admission Coordinator Pre-Admission Assessment  Patient: Ann Lynch is an 56 y.o., female MRN: [id number] DOB: 07-Sep-1960 Height: 5\' 7"  (170.2 cm) Weight: 77.1 kg (170 lb)                                                                                                                                                  Insurance Information HMO:     PPO: yes     PCP:      IPA:      80/20:      OTHER: Medicare advantage plan PRIMARY: A[employer] Medicare      Policy#: [id number]      Subscriber: pt CM Name: Sunday Shams   Phone#: [phone]    Fax#: [phone] Pre-Cert#: [id number]   Approved for 7 days with f/u with Illene Bolus at phone [phone]  Employer: disabled Benefits:  Phone #: online     Name: 06/15/16 Eff. Date: 01/19/16     Deduct: none      Out of Pocket Max: $4500      Life Max: none CIR: $250 copay per day days 1-6 then covers 100%      SNF: no co pay days 1-20; $164 co pay per day days 21-100 Outpatient: $40 co pay per visit     Co-Pay: visits per medical neccesity Home Health: 100%      Co-Pay: visits per medical neccesity DME: 80%     Co-Pay: 20% Providers: in network  SECONDARY: none      Medicaid Application Date:       Case Manager:  Disability Application Date:       Case Worker:   Emergency Contact Information        Contact Information    Name Relation Home Work [place] Mother [phone]       Current Medical History  Patient Admitting Diagnosis: 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 CT of the head negative for acute changes. CT of the pelvis showed suspicion of left pelvic and femoral vein DVT. Bilateral lower extremity Doppler showed left-positive acute DVT indeterminate age in the iliac, common femoral, femoral and popliteal veins. Also superficial thrombosis of the saphenofemoral junction and the proximal greater saphenous vein. Right positive DVT iliac, common femoral, femoral popliteal veins. Placed on Lovenox with bridging transition to Coumadin.. Findings of elevated  creatinine 5.22 on admission and sodium 130. Renal ultrasound negative for hydronephrosis. Latest renal function 2015 within normal limits. Myocarditis was held as well as HCTZ. Vascular surgery follow-up for bilateral DVTs not a candidate for thrombo-lysis due to renal insufficiency. Renal function normalizing 1.43.Patient spiked low-grade fever question related to DVTs. WBC 11,700. Chest x-ray showed atelectasis versus pneumonia at the bases greater on the left.CT of the chest showed small right and trace left dependent pleural effusions. Consolidation and volume loss in the dependent right lower lobe favoring compressive atelectasis.Placed on Augmentin 05/30/2018for empiric coverage.  Past Medical History  Past Medical History:  Diagnosis Date  . Hypertension   . Stroke [facility])     Family History  family history includes Diabetes Mellitus II in her mother; Hypertension in her father and mother.  Prior Rehab/Hospitalizations:  Has the patient had major surgery during 100 days prior to admission? No   Original CVA 2009 and then pt went to rehab. Mom moved pt from [place] to care for her here in [place].  Current Medications   Current Facility-Administered Medications:  .  amitriptyline (ELAVIL) tablet 25 mg, 25 mg, Oral, QHS,  Reubin Milan, MD, 25 mg at 06/16/16 2110 .  amoxicillin-clavulanate (AUGMENTIN) 875-125 MG per tablet 1 tablet, 1 tablet, Oral, Q12H, Hosie Poisson, MD, 1 tablet at 06/17/16 0902 .  aspirin EC tablet 81 mg, 81 mg, Oral, Daily, Reubin Milan, MD, 81 mg at 06/17/16 3300 .  atorvastatin (LIPITOR) tablet 40 mg, 40 mg, Oral, QHS, Reubin Milan, MD, 40 mg at 06/16/16 2110 .  cloNIDine (CATAPRES) tablet 0.2 mg, 0.2 mg, Oral, BID, Abrol, Nayana, MD, 0.2 mg at 06/17/16 0903 .  DULoxetine (CYMBALTA) DR capsule 20 mg, 20 mg, Oral, QHS, Reubin Milan, MD, 20 mg at 06/16/16 2109 .  enoxaparin (LOVENOX) injection 75 mg, 75 mg, Subcutaneous, Q12H, Abrol, Nayana, MD .  feeding supplement (ENSURE ENLIVE) (ENSURE ENLIVE) liquid 237 mL, 237 mL, Oral, BID BM, Hosie Poisson, MD, 237 mL at 06/17/16 0903 .  folic acid (FOLVITE) tablet 1 mg, 1 mg, Oral, Daily, Reubin Milan, MD, 1 mg at 06/17/16 7622 .  gabapentin (NEURONTIN) capsule 400 mg, 400 mg, Oral, TID, Reubin Milan, MD, 400 mg at 06/17/16 0902 .  metoprolol tartrate (LOPRESSOR) tablet 100 mg, 100 mg, Oral, BID, Reubin Milan, MD, 100 mg at 06/17/16 0902 .  ondansetron (ZOFRAN) tablet 4 mg, 4 mg, Oral, Q6H PRN **OR** ondansetron (ZOFRAN) injection 4 mg, 4 mg, Intravenous, Q6H PRN, Reubin Milan, MD .  oxyCODONE-acetaminophen (PERCOCET/ROXICET) 5-325 MG per tablet 1 tablet, 1 tablet, Oral, Q6H PRN, Reubin Milan, MD, 1 tablet at 06/17/16 [phone] .  phenytoin (DILANTIN) ER capsule 100 mg, 100 mg, Oral, TID, Reubin Milan, MD, 100 mg at 06/17/16 0440 .  polyethylene glycol (MIRALAX / GLYCOLAX) packet 17 g, 17 g, Oral, Daily, Hosie Poisson, MD, 17 g at 06/17/16 0902 .  senna-docusate (Senokot-S) tablet 1 tablet, 1 tablet, Oral, BID, Hosie Poisson, MD, 1 tablet at 06/17/16 0903 .  Warfarin - Pharmacist Dosing Inpatient, , Does not apply, q1800, Kris Mouton [facility], Stopped at 06/17/16 1800  Patients Current  Diet: Diet Heart Room service appropriate? Yes; Fluid consistency: Thin Diet - low sodium heart healthy  Precautions / Restrictions Precautions Precautions: Fall Precaution Comments: right hemiplegia Restrictions Weight Bearing Restrictions: No   Has the patient had 2 or more falls or a fall with injury in the past year?No  Prior Activity Level Limited Community (1-2x/wk): wheelchair level at home. Mom is legal guardian  Development worker, international aid / [place] Devices/Equipment: Wheelchair Home Equipment: Environmental consultant - 2 wheels, Bedside commode, Wheelchair - manual, Wheelchair - power, Hospital bed, Hand held shower head   Mom is requesting new hospital bed and wheelchair  Prior Device Use: Indicate devices/aids used by the patient prior to current illness, exacerbation or injury? manual wheelchair. Has electric but pt does not use  Prior Functional Level Prior Function Level of Independence: Needs assistance Gait / Transfers Assistance Needed: non ambulatory only performs stand pivots to the left other than from toilet where she transfers to the right as well ADL's / Homemaking Assistance Needed: Feeds self, does dressing except cannot stand and pull up clothing at same time, can bath self once A'd into tub; can toilet with assist for pants Comments: pt was transferring on her own until approximately 10 days prior to admission when she required help from mom due to weakness and pain.   Self Care: Did the patient need help bathing, dressing, using the toilet or eating?  Needed some help  Indoor Mobility: Did the patient need assistance with walking from room to room (with or without device)? Needed some help  Stairs: Did the patient need assistance with internal or external stairs (with or without device)? Needed some help  Functional Cognition: Did the patient need help planning regular tasks such as shopping or remembering to take medications? Needed some  help  Current Functional Level Cognition  Overall Cognitive Status: Within Functional Limits for tasks assessed Orientation Level: Oriented X4 General Comments: Some of the information she gave [place] was not what her mother gave [place] as far as her PLOF with transfers and clothing management    Extremity Assessment (includes Sensation/Coordination)  Upper Extremity Assessment: Defer to OT evaluation RUE Deficits / Details: Flaccid, 1 finger subluxation  Lower Extremity Assessment: RLE deficits/detail, Generalized weakness RLE Deficits / Details: baseline hemiplegia    ADLs  Overall ADL's : Needs assistance/impaired Eating/Feeding: Set up (supported sitting) Grooming: Wash/dry hands, Wash/dry face, Set up Grooming Details (indicate cue type and reason): supported sitting Upper Body Bathing: Minimal assistance (supported sitting) Lower Body Bathing: Moderate assistance Lower Body Bathing Details (indicate cue type and reason): supported sit/lateral leans Upper Body Dressing : Minimal assistance Upper Body Dressing Details (indicate cue type and reason): supported sitting Lower Body Dressing: Moderate assistance Lower Body Dressing Details (indicate cue type and reason): S to doff socks, min guard A to don socks; min A for sit>stand and total A to pull up/down clothing Toilet Transfer: Minimal assistance, Control and instrumentation engineer Details (indicate cue type and reason): bed>recliner going to left    Mobility  Overal bed mobility: Needs Assistance Bed Mobility: Supine to Sit Rolling: Mod assist (use or rail) Supine to sit: Min guard, HOB elevated Sit to supine: Mod assist (Need A for RLE and trunk) General bed mobility comments: use of rail and coming out on left side of bed, increased time, momentum, and hooking her RLE with her LLE. pt able to adjust pelvis and problem solve with transfer to perform on her own. She transfers to left at home    Transfers  Overall transfer  level: Needs assistance Equipment used: 1 person hand held assist Transfers: Sit to/from Stand, Google Transfers Sit to Stand: Min assist, Mod assist Squat pivot transfers: Min assist General transfer comment: pt able to perform squat pivot to left with min assist for  slight assist to rise, pt able to pivot pelvis and guide self to chair on her own. For standing placed chair beside bed with foot rail up on pt's left. Pt grasped rail and pulled to standing x 4 with min A 1st trial and mod assist repeated 3 trials for rise and anterior translation. Pt stood  15, 5,15, 10 sec respectively with cues for upright posture and min guard for balance other than 3rd trial which required mod assist for balance. Standing limited by left inner thigh pain    Ambulation / Gait / Stairs / Wheelchair Mobility       Posture / Balance Balance Overall balance assessment: Needs assistance Sitting-balance support: No upper extremity supported, Feet supported Sitting balance-Leahy Scale: Fair Postural control: Posterior lean Standing balance support: Single extremity supported Standing balance-Leahy Scale: Poor Standing balance comment: reliant on LUE support and A    Special needs/care consideration BiPAP/CPAP  N/a CPM  N/a Continuous Drip IV  N/a Dialysis  N/a Life Vest  N/a Oxygen  N/a Special Bed  N/a Trach Size  N/a Wound Vac  N/a Skin intact                       Bowel mgmt: continent LBM 06/13/16 Bladder mgmt:external female catheter Diabetic mgmt n/a   Previous Home Environment Living Arrangements:  (and 36 yo sister, Olivia Mackie)  Lives With: Family Available Help at Discharge: Available 24 hours/day, Family Type of Home: House Home Layout: One level Home Access: Ramped entrance Bathroom Shower/Tub: Tub/shower unit, Architectural technologist: Standard Bathroom Accessibility: Yes How Accessible: Accessible via wheelchair Home Care Services: No Additional Comments: per mom the [facility] and  hospital bed need to be replaced due to poor condition from wear  Discharge Living Setting Plans for Discharge Living Setting: Lives with (comment) (Mom and sister, Olivia Mackie) Type of Home at Discharge: House Discharge Home Layout: One level Discharge Home Access: [place] entrance Discharge Bathroom Shower/Tub: Tub/shower unit, Curtain Discharge Bathroom Toilet: Standard Discharge Bathroom Accessibility: Yes How Accessible: Accessible via wheelchair Does the patient have any problems obtaining your medications?: No  Social/Family/Support Systems Patient Roles: Parent (has 3 adult children in [place]) Contact Information: Berenice Primas, Mom Anticipated Caregiver: Mom and sister, Olivia Mackie Anticipated Ambulance person Information: see above Ability/Limitations of Caregiver: Mom can provide upt to min assist Caregiver Availability: 24/7 Discharge Plan Discussed with Primary Caregiver: Yes Is Caregiver In Agreement with Plan?: Yes Does Caregiver/Family have Issues with Lodging/Transportation while Pt is in Rehab?: No   Mom is with pt 24/7. Pt's sister, Olivia Mackie is 48 years old and attends [employer] to become a nurse  Goals/Additional Needs Patient/Family Goal for Rehab: Mod I to min assist with PT and OT at wheelchair level Expected length of stay: ELOS 8-10 days Pt/Family Agrees to Admission and willing to participate: Yes Program Orientation Provided & Reviewed with Pt/Caregiver Including Roles  & Responsibilities: Yes  Decrease burden of Care through IP rehab admission: n/a  Possible need for SNF placement upon discharge: not anticipated  Patient Condition: This patient's medical and functional status has changed since the consult dated: 06/15/2016 in which the Rehabilitation Physician determined and documented that the patient's condition is appropriate for intensive rehabilitative care in an inpatient rehabilitation facility. See "History of Present Illness" (above) for medical update. Functional  changes are: overall mod assist transfers. Patient's medical and functional status update has been discussed with the Rehabilitation physician and patient remains appropriate for inpatient rehabilitation. Will admit to inpatient  rehab today.  Preadmission Screen Completed By:  Cleatrice Burke, 06/17/2016 11:51 AM ______________________________________________________________________   Discussed status with Dr. Naaman Plummer on 06/17/2016 at  51 and received telephone approval for admission today.  Admission Coordinator:  Cleatrice Burke, time 3888 Date 06/17/2016       Cosigned by: [name], MD at 06/17/2016 11:55 AM  Revision History

## 2016-06-17 NOTE — Progress Notes (Signed)
Pt admitted to unit at 1541 with no family at bedisde. RN reviewed safety plan and schdule with pt. Mother is legal guardian therefore RN unable to complete admission questions. Pt incontinent of bladder prior to admission and has external catheter to suction in place. Right side flaccid from prior stoke 2009. BLE edema with L>R. Pt wanted to sleep and denies pain.

## 2016-06-17 NOTE — Progress Notes (Signed)
Pt medically cleared for d/c today to inpt rehab by Dr. Allyson Sabal. Pt in agreement. I contacted pt's Mom by phone and she is in agreement and will be here at hospital by 2 pm to assist with the admission. I will make the arrangements to admit today. [phone]

## 2016-06-17 NOTE — Progress Notes (Signed)
Ann Staggers, MD Physician Signed Physical Medicine and Rehabilitation  Consult Note Date of Service: 06/15/2016 12:16 PM  Related encounter: ED to Hosp-Admission (Current) from 06/11/2016 in [place] All Collapse All   [] Hide copied text [] Hover for attribution information      Physical Medicine and Rehabilitation Consult Reason for Consult: Decreased functional mobility Referring Physician: [facility]   HPI: Ann Lynch is a 56 y.o. 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 Per chart review patient lives with mother and essentially wheelchair bound however she was able to perform stand pivot transfers with assistance. Presented 06/11/2016 with left lower extremity pain after recent fall. CT of the head negative for acute changes. CT of the pelvis showed suspicion of left pelvic and femoral vein DVT. Bilateral lower extremity Doppler showed left- positive acute DVT indeterminate age in the iliac, common femoral, femoral and popliteal veins. Also superficial thrombosis of the saphenofemoral junction and the proximal greater saphenous vein. Right positive DVT iliac, common femoral, femoral popliteal veins. Placed on intravenous heparin. Findings of elevated creatinine 5.22 on admission and sodium 1:30. Renal ultrasound negative for hydronephrosis. Latest renal function 2015 within normal limits. Myocarditis was held as well as HCTZ. Vascular surgery follow-up for bilateral DVTs not a candidate for thrombo-lysis due to renal insufficiency. Renal function normalizing 1.51. Physical therapy evaluation completed 06/15/2016 with recommendations of physical medicine rehabilitation consult.   Review of Systems  Constitutional: Positive for malaise/fatigue. Negative for chills and fever.       Right sided weakness  Eyes: Negative for blurred vision and double vision.   Respiratory: Negative for cough and shortness of breath.   Cardiovascular: Positive for leg swelling. Negative for chest pain and palpitations.  Gastrointestinal: Positive for constipation. Negative for nausea and vomiting.  Genitourinary: Negative for dysuria, flank pain, hematuria and urgency.  Musculoskeletal: Positive for joint pain and myalgias.  Skin: Negative for rash.  Neurological: Positive for speech change and seizures.  All other systems reviewed and are negative.      Past Medical History:  Diagnosis Date  . Hypertension   . Stroke [facility])         Past Surgical History:  Procedure Laterality Date  . TRACHEOSTOMY          Family History  Problem Relation Age of Onset  . Hypertension Mother   . Diabetes Mellitus II Mother   . Hypertension Father    Social History:  reports that she has never smoked. She has never used smokeless tobacco. She reports that she does not drink alcohol. Her drug history is not on file. Allergies: No Known Allergies       Medications Prior to Admission  Medication Sig Dispense Refill  . amitriptyline (ELAVIL) 25 MG tablet Take 25 mg by mouth at bedtime.    Marland Kitchen aspirin EC 81 MG tablet Take 81 mg by mouth daily.    Marland Kitchen atorvastatin (LIPITOR) 40 MG tablet Take 40 mg by mouth at bedtime.    . cloNIDine (CATAPRES) 0.3 MG tablet Take 1 tablet (0.3 mg total) by mouth 4 (four) times daily. (Patient taking differently: Take 0.2 mg by mouth 4 (four) times daily. ) 120 tablet 0  . DULoxetine (CYMBALTA) 20 MG capsule Take 20 mg by mouth at bedtime.     . folic acid (FOLVITE) 1 MG tablet Take 1 mg by mouth daily.    Marland Kitchen  gabapentin (NEURONTIN) 400 MG capsule Take 400 mg by mouth 3 (three) times daily.    . metoprolol (LOPRESSOR) 100 MG tablet Take 100 mg by mouth 2 (two) times daily.    . phenytoin (DILANTIN) 100 MG ER capsule Take 100 mg by mouth 3 (three) times daily.     Marland Kitchen spironolactone (ALDACTONE) 25 MG tablet Take 25 mg by  mouth 3 (three) times daily.    Marland Kitchen telmisartan-hydrochlorothiazide (MICARDIS HCT) 80-12.5 MG per tablet Take 1 tablet by mouth daily.      Home: Home Living Family/patient expects to be discharged to:: Inpatient rehab Living Arrangements: Parent Available Help at Discharge: Available 24 hours/day, Family Type of Home: House Home Access: Ramped entrance [place]: One level Bathroom Shower/Tub: Tub/shower unit, Architectural technologist: Standard Home Equipment: Environmental consultant - 2 wheels, Bedside commode, Wheelchair - manual, Wheelchair - power, Hospital bed, Hand held shower head Additional Comments: per mom the [facility] and hospital bed need to be replaced due to poor condition from wear  Functional History: Prior Function Level of Independence: Needs assistance Gait / Transfers Assistance Needed: non ambulatory only performs stand pivots to the left other than from toilet where she transfers to the right as well ADL's / Homemaking Assistance Needed: Feeds self, does dressing except cannot stand and pull up clothing at same time, can bath self once A'd into tub; can toilet with assist for pants Comments: pt was transferring on her own until approximately 10 days prior to admission when she required help from mom due to weakness and pain.  Functional Status:  Mobility: Bed Mobility Overal bed mobility: Needs Assistance Bed Mobility: Supine to Sit Rolling: Mod assist (use or rail) Supine to sit: Min guard, HOB elevated Sit to supine: Mod assist (Need A for RLE and trunk) General bed mobility comments: use of rail and coming out on left side of bed, increased time, momentum, and hooking her RLE with her LLE. pt able to adjust pelvis and problem solve with transfer to perform on her own. She transfers to left at home Transfers Overall transfer level: Needs assistance Equipment used: 1 person hand held assist Transfers: Sit to/from Stand, [employer] Transfers Sit to Stand: Min assist, Mod  assist Squat pivot transfers: Min assist General transfer comment: For sit<>stand; 4 times. Min A from bed, min A from recliner with chair in front of her; Mod A from recliner holding onto sink on her left side and sink in front of her. Pt with tendency with posterior lean and right knee going into extension with sit>stand (verbal cues to lean more foreward and A to block RLE)--per mother with sit>stand pt usually has her RLE behind her LLE with sit to stand  ADL: ADL Overall ADL's : Needs assistance/impaired Eating/Feeding: Set up (supported sitting) Grooming: Wash/dry hands, Wash/dry face, Set up Grooming Details (indicate cue type and reason): supported sitting Upper Body Bathing: Minimal assistance (supported sitting) Lower Body Bathing: Moderate assistance Lower Body Bathing Details (indicate cue type and reason): supported sit/lateral leans Upper Body Dressing : Minimal assistance Upper Body Dressing Details (indicate cue type and reason): supported sitting Lower Body Dressing: Moderate assistance Lower Body Dressing Details (indicate cue type and reason): S to doff socks, min guard A to don socks; min A for sit>stand and total A to pull up/down clothing Toilet Transfer: Minimal assistance, Control and instrumentation engineer Details (indicate cue type and reason): bed>recliner going to left  Cognition: Cognition Overall Cognitive Status: Within Functional Limits for tasks assessed Orientation  Level: Oriented X4 Cognition Arousal/Alertness: Awake/alert Behavior During Therapy: Flat affect Overall Cognitive Status: Within Functional Limits for tasks assessed General Comments: Some of the information she gave [place] was not what her mother gave [place] as far as her PLOF with transfers and clothing management  Blood pressure (!) 146/98, pulse (!) 105, temperature 98.9 F (37.2 C), temperature source Oral, resp. rate 18, height 5\' 7"  (1.702 m), weight 77.1 kg (170 lb), SpO2 99 %. Physical Exam    Constitutional:  56 year old right-handed female  HENT:  Head: Normocephalic.  Eyes:  Pupils reactive to light  Neck: Normal range of motion. Neck supple. No thyromegaly present.  Cardiovascular: Normal rate and regular rhythm.   Respiratory: Effort normal and breath sounds normal. No respiratory distress.  GI: Soft. Bowel sounds are normal. She exhibits no distension.  Musculoskeletal: She exhibits edema.  Bilateral LE edema left more than right  Neurological: She is alert.  Patient is oriented to person, place and time. She provides her date of birth. Fair awareness of deficits. Dense RUE hemiparesis. RLE 1-2/5 in extension synergy pattern.  Dysconjugate gaze. 1-2/4 tone RLE. 3 beats clonus right ankle  Psychiatric: She has a normal mood and affect. Her behavior is normal.    Lab Results Last 24 Hours       Results for orders placed or performed during the hospital encounter of 06/11/16 (from the past 24 hour(s))  Heparin level (unfractionated)     Status: None   Collection Time: 06/15/16  3:40 AM  Result Value Ref Range   Heparin Unfractionated 0.55 0.30 - 0.70 IU/mL  CBC     Status: Abnormal   Collection Time: 06/15/16  3:40 AM  Result Value Ref Range   WBC 11.7 (H) 4.0 - 10.5 K/uL   RBC 2.67 (L) 3.87 - 5.11 MIL/uL   Hemoglobin 8.1 (L) 12.0 - 15.0 g/dL   HCT 24.3 (L) 36.0 - 46.0 %   MCV 91.0 78.0 - 100.0 fL   MCH 30.3 26.0 - 34.0 pg   MCHC 33.3 30.0 - 36.0 g/dL   RDW 14.1 11.5 - 15.5 %   Platelets 208 150 - 400 K/uL  Comprehensive metabolic panel     Status: Abnormal   Collection Time: 06/15/16  3:40 AM  Result Value Ref Range   Sodium 134 (L) 135 - 145 mmol/L   Potassium 3.6 3.5 - 5.1 mmol/L   Chloride 102 101 - 111 mmol/L   CO2 24 22 - 32 mmol/L   Glucose, Bld 134 (H) 65 - 99 mg/dL   BUN 25 (H) 6 - 20 mg/dL   Creatinine, Ser 1.51 (H) 0.44 - 1.00 mg/dL   Calcium 8.6 (L) 8.9 - 10.3 mg/dL   Total Protein 7.0 6.5 - 8.1 g/dL   Albumin 2.0  (L) 3.5 - 5.0 g/dL   AST 95 (H) 15 - 41 U/L   ALT 43 14 - 54 U/L   Alkaline Phosphatase 139 (H) 38 - 126 U/L   Total Bilirubin 2.0 (H) 0.3 - 1.2 mg/dL   GFR calc non Af Amer 38 (L) >60 mL/min   GFR calc Af Amer 44 (L) >60 mL/min   Anion gap 8 5 - 15     Imaging Results (Last 48 hours)  No results found.    Assessment/Plan: Diagnosis: debility due bilateral LE DVT's with history of prior left CVA with right hemiparesis 1. Does the need for close, 24 hr/day medical supervision in concert with the patient's rehab needs make  it unreasonable for this patient to be served in a less intensive setting? Yes 2. Co-Morbidities requiring supervision/potential complications: HTN, depression, spasticity 3. Due to bladder management, bowel management, safety, skin/wound care, disease management, medication administration, pain management and patient education, does the patient require 24 hr/day rehab nursing? Yes 4. Does the patient require coordinated care of a physician, rehab nurse, PT (1-2 hrs/day, 5 days/week) and OT (1-2 hrs/day, 5 days/week) to address physical and functional deficits in the context of the above medical diagnosis(es)? Yes Addressing deficits in the following areas: balance, endurance, locomotion, strength, transferring, bowel/bladder control, bathing, dressing, feeding, grooming, toileting and psychosocial support 5. Can the patient actively participate in an intensive therapy program of at least 3 hrs of therapy per day at least 5 days per week? Yes 6. The potential for patient to make measurable gains while on inpatient rehab is excellent 7. Anticipated functional outcomes upon discharge from inpatient rehab are modified independent, supervision, min assist and w/c level  with PT, modified independent, supervision and min assist with OT, n/a with SLP. 8. Estimated rehab length of stay to reach the above functional goals is: 8-10 days 9. Anticipated D/C setting:  Home 10. Anticipated post D/C treatments: HH therapy and Outpatient therapy 11. Overall Rehab/Functional Prognosis: good  RECOMMENDATIONS: This patient's condition is appropriate for continued rehabilitative care in the following setting: CIR Patient has agreed to participate in recommended program. Yes Note that insurance prior authorization may be required for reimbursement for recommended care.  Comment: Rehab Admissions Coordinator to follow up.  Thanks,  Ann Staggers, MD, Mellody Drown    Cathlyn Parsons., PA-C 06/15/2016    Revision History                        Routing History

## 2016-06-17 NOTE — Progress Notes (Signed)
[place] for heparin> lovenox/coumadin Indication: DVT (extensive bilateral DVTs)  No Known Allergies  Patient Measurements: Height: 5' 7" (170.2 cm) Weight: 170 lb (77.1 kg) IBW/kg (Calculated) : 61.6 Heparin Dosing Weight: 77 kg  Vital Signs: Temp: 98.4 F (36.9 C) (05/31 0454) Temp Source: Oral (05/31 0454) BP: 129/87 (05/31 0454) Pulse Rate: 101 (05/31 0454)  Labs:  Recent Labs  06/15/16 0340 06/16/16 0308 06/17/16 0237  HGB 8.1*  --   --   HCT 24.3*  --   --   PLT 208  --   --   LABPROT  --  16.5* 31.8*  INR  --  1.32 3.00  HEPARINUNFRC 0.55  --   --   CREATININE 1.51* 1.43*  --      Medical History: Past Medical History:  Diagnosis Date  . Hypertension   . Stroke [facility])      Assessment: 56 yo female with extensive bilateral DVTs in R and L lower extremities, and started on IV heparin. Avoiding Xarelto due to significant interaction with phenytoin.  Phenytoin is a strong CYP3A4 and P-gp inducer and should not be used with DOACs (although there is little data with edoxaban).  Plans for anticoagulation are coumadin/lovenox (day 3 overlap).  INR with sharp rise (1.11 >>1.32 >>3) after 7.73m on 5/28 and 552mon 5/29.  Drug interactions noted with Phenytoin and Augmentin therapy likely contributing to elevated INR as well as liver function.  LFTs trending up: AST up to 102 (ALT stable), Alk Phos rising at 133,  Tbili rising at 2.8 - on statin therapy as well. Po Intake 50-75% - adequate SCr trending down - 1.43 on 5/30 with estimated CrCl ~48 mL/min.    Goal of Therapy:  INR 2-3 Anti-Xa level 0.6-1 units/ml 4hrs after LMWH dose given Monitor platelets by anticoagulation protocol: Yes   Plan:  -Hold Coumadin today due to sharp rise in INR and interacting medications -Daily PT/INR -Change Lovenox to 3m26mg SQ q12h - next dose due 1400 but continue as only day #1 of INR >2 and extensive bilateral DVTs (changed to this  dosing so that lower dose given each time in case needs to be held with uptrend in INR) -Check STAT CBC, if trending down and/or concern for bleed will hold Lovenox -Consider holding statin therapy with rising LFTs   JesSloan LeiterharmD, BCPS Clinical Pharmacist Clinical Phone 06/17/2016 until 3:30 PM -#[phone] hours, please call #[phone]  06/17/2016 8:41 AM

## 2016-06-17 NOTE — Progress Notes (Signed)
PT Cancellation Note  Patient Details Name: Ann Lynch MRN: [id number] DOB: 1960/02/24   Cancelled Treatment:    Reason Eval/Treat Not Completed: Patient at procedure or test/unavailable (Pt in CT and unavailable)   Jacynda Brunke B Samaj Wessells 06/17/2016, 10:02 AM Elwyn Reach, [place]

## 2016-06-17 NOTE — Progress Notes (Signed)
Physical Therapy Treatment Patient Details Name: Ann Lynch MRN: [id number] DOB: December 22, 1960 Today's Date: 06/17/2016    History of Present Illness Ann Lynch is a 56 y.o. female with medical history significant of hypertension, CVA with right-sided hemiplegia who was brought to the emergency department with left lower extremity pain after having a fall 2 days ago, followed by drowsiness since day before admit. Found to have Bil LE DVTs    PT Comments    Pt very pleasant and highly motivated to move and return home.  Pt able to transfer and stand x 4 trials today with use of bed rail. Pt reports left thigh pain limiting movement. Pt educated for HEP and encouraged to continue mobility throughout the day. Pt remains appropriate for CIR to return to mod I with all transfers.  Will continue to follow. Right foot blocked for all standing trials today.    Follow Up Recommendations  CIR;Supervision/Assistance - 24 hour     Equipment Recommendations  3in1 (PT);Hospital bed    Recommendations for Other Services       Precautions / Restrictions Precautions Precautions: Fall Precaution Comments: right hemiplegia    Mobility  Bed Mobility Overal bed mobility: Needs Assistance Bed Mobility: Supine to Sit     Supine to sit: Min guard;HOB elevated     General bed mobility comments: use of rail and coming out on left side of bed, increased time, momentum, and hooking her RLE with her LLE. pt able to adjust pelvis and problem solve with transfer to perform on her own. She transfers to left at home  Transfers Overall transfer level: Needs assistance   Transfers: Sit to/from [employer] Transfers Sit to Stand: Min assist;Mod assist   Squat pivot transfers: Min assist     General transfer comment: pt able to perform squat pivot to left with min assist for slight assist to rise, pt able to pivot pelvis and guide self to chair on her own. For standing placed chair  beside bed with foot rail up on pt's left. Pt grasped rail and pulled to standing x 4 with min A 1st trial and mod assist repeated 3 trials for rise and anterior translation. Pt stood  15, 5,15, 10 sec respectively with cues for upright posture and min guard for balance other than 3rd trial which required mod assist for balance. Standing limited by left inner thigh pain  Ambulation/Gait                 Stairs            Wheelchair Mobility    Modified Rankin (Stroke Patients Only)       Balance Overall balance assessment: Needs assistance   Sitting balance-Leahy Scale: Fair       Standing balance-Leahy Scale: Poor                              Cognition Arousal/Alertness: Awake/alert Behavior During Therapy: Flat affect Overall Cognitive Status: Within Functional Limits for tasks assessed                                        Exercises General Exercises - Lower Extremity Long Arc Quad: AROM;Left;Seated;10 reps Hip Flexion/Marching: AAROM;Left;Seated;10 reps    General Comments        Pertinent Vitals/Pain Pain Assessment: 0-10 Pain Score: 7  Pain Location: left inner thigh Pain Descriptors / Indicators: Shooting Pain Intervention(s): Limited activity within patient's tolerance;Repositioned    Home Living                      Prior Function            PT Goals (current goals can now be found in the care plan section) Progress towards PT goals: Progressing toward goals    Frequency           PT Plan      Co-evaluation              AM-PAC PT "6 Clicks" Daily Activity  Outcome Measure  Difficulty turning over in bed (including adjusting bedclothes, sheets and blankets)?: Total Difficulty moving from lying on back to sitting on the side of the bed? : Total Difficulty sitting down on and standing up from a chair with arms (e.g., wheelchair, bedside commode, etc,.)?: Total Help needed moving to  and from a bed to chair (including a wheelchair)?: A Little Help needed walking in hospital room?: Total Help needed climbing 3-5 steps with a railing? : Total 6 Click Score: 8    End of Session Equipment Utilized During Treatment: Gait belt Activity Tolerance: Patient tolerated treatment well Patient left: in chair;with call bell/phone within Lynch;with chair alarm set Nurse Communication: Mobility status;Precautions;Other (comment) PT Visit Diagnosis: Unsteadiness on feet (R26.81);Other abnormalities of gait and mobility (R26.89);Muscle weakness (generalized) (M62.81)     Time: 8563-1497 PT Time Calculation (min) (ACUTE ONLY): 29 min  Charges:  $Therapeutic Exercise: 8-22 mins $Therapeutic Activity: 8-22 mins                    G Codes:       Ann Lynch, PT [phone]    Ann Lynch 06/17/2016, 11:48 AM

## 2016-06-17 NOTE — Discharge Summary (Addendum)
Physician Discharge Summary  Ann Lynch MRN: [id number] DOB/AGE: April 27, 1960 56 y.o.  PCP: [name], MD   Admit date: 06/11/2016 Discharge date: 06/17/2016  Discharge Diagnoses:    Principal Problem:   Sepsis due to cellulitis [facility]) Active Problems:   AKI (acute kidney injury) ([place])   CVA, old, hemiparesis ([place])   HTN (hypertension)    Follow-up recommendations Follow-up with PCP in 3-5 days , including all  additional recommended appointments as below Follow-up CBC, CMP in 3-5 days Please follow INR on a daily basis Pharmacy to continue to manage Coumadin, INR Monitor CBC on a regular basis, 7.4 prior to dc transfuse for hg <7    Current Discharge Medication List    START taking these medications   Details  amoxicillin-clavulanate (AUGMENTIN) 875-125 MG tablet Take 1 tablet by mouth every 12 (twelve) hours. Qty: 10 tablet, Refills: 0    enoxaparin (LOVENOX) 80 MG/0.8ML injection Inject 0.75 mLs (75 mg total) into the skin every 12 (twelve) hours. Qty: 2 Syringe, Refills: 0    feeding supplement, ENSURE ENLIVE, (ENSURE ENLIVE) LIQD Take 237 mLs by mouth 2 (two) times daily between meals. Qty: 237 mL, Refills: 12    oxyCODONE-acetaminophen (PERCOCET/ROXICET) 5-325 MG tablet Take 1 tablet by mouth every 6 (six) hours as needed for moderate pain. Qty: 30 tablet, Refills: 0    polyethylene glycol (MIRALAX / GLYCOLAX) packet Take 17 g by mouth daily. Qty: 14 each, Refills: 0    senna-docusate (SENOKOT-S) 8.6-50 MG tablet Take 1 tablet by mouth 2 (two) times daily. Qty: 30 tablet, Refills: 1    warfarin (COUMADIN) 5 MG tablet Take 1 tablet (5 mg total) by mouth daily. Qty: 30 tablet, Refills: 11      CONTINUE these medications which have CHANGED   Details  spironolactone (ALDACTONE) 25 MG tablet Take 1 tablet (25 mg total) by mouth 3 (three) times daily. Qty: 30 tablet, Refills: 1      CONTINUE these medications which have NOT CHANGED   Details   amitriptyline (ELAVIL) 25 MG tablet Take 25 mg by mouth at bedtime.    aspirin EC 81 MG tablet Take 81 mg by mouth daily.    atorvastatin (LIPITOR) 40 MG tablet Take 40 mg by mouth at bedtime.    cloNIDine (CATAPRES) 0.3 MG tablet Take 1 tablet (0.3 mg total) by mouth 4 (four) times daily. Qty: 120 tablet, Refills: 0    DULoxetine (CYMBALTA) 20 MG capsule Take 20 mg by mouth at bedtime.     folic acid (FOLVITE) 1 MG tablet Take 1 mg by mouth daily.    gabapentin (NEURONTIN) 400 MG capsule Take 400 mg by mouth 3 (three) times daily.    metoprolol (LOPRESSOR) 100 MG tablet Take 100 mg by mouth 2 (two) times daily.    phenytoin (DILANTIN) 100 MG ER capsule Take 100 mg by mouth 3 (three) times daily.       STOP taking these medications     telmisartan-hydrochlorothiazide (MICARDIS HCT) 80-12.5 MG per tablet           Discharge Condition: Stable        No Known Allergies    Disposition: 06-Home-Health Care Svc   Consults:      Significant Diagnostic Studies:  Dg Pelvis 1-2 Views  Result Date: 06/11/2016 CLINICAL DATA:  Recent fall with pelvic pain, initial encounter EXAM: PELVIS - 1-2 VIEW COMPARISON:  None. FINDINGS: Pelvic ring appears intact. The proximal right femur is within normal limits. Some  linear bony densities are noted superimposed over the right hip joint likely related to dystrophic bone formation although the possibility of a pelvic fracture in the posterior aspect of the right acetabulum could not be excluded. CT would be helpful for further evaluation. IMPRESSION: Irregular bony density overlying the right hip joint which is likely chronic although the possibility of an acute acetabular fracture deserves consideration. CT of the pelvis is recommended for further evaluation. Electronically Signed   By: Alcide Clever M.D.   On: 06/11/2016 18:30   Dg Knee 1-2 Views Left  Result Date: 06/11/2016 CLINICAL DATA:  Altered mental status and recent fall with  left knee pain, initial encounter EXAM: LEFT KNEE - 2 VIEW COMPARISON:  None. FINDINGS: No evidence of fracture, dislocation, or joint effusion. No evidence of arthropathy or other focal bone abnormality. Soft tissues are unremarkable. IMPRESSION: No acute abnormality noted. Electronically Signed   By: Alcide Clever M.D.   On: 06/11/2016 18:31   Ct Head Wo Contrast  Result Date: 06/11/2016 CLINICAL DATA:  Altered mental status. EXAM: CT HEAD WITHOUT CONTRAST TECHNIQUE: Contiguous axial images were obtained from the base of the skull through the vertex without intravenous contrast. COMPARISON:  Head CT 06/08/2013 and MRI 06/09/2013 FINDINGS: Brain: A chronic left thalamic infarct is again seen with associated ex vacuo dilatation of the left lateral ventricle and wallerian degeneration along the corticospinal tract. Confluent gliosis extending superiorly into the centrum semiovale is unchanged. Chronic small vessel ischemic changes in the right cerebral white matter are also similar to the prior studies. No acute infarct, acute intracranial hemorrhage, mass, or extra-axial fluid collection is seen. Vascular: Calcified atherosclerosis at the skullbase, age advanced. No hyperdense vessel. Skull: No fracture or focal osseous lesion. Sinuses/Orbits: Paranasal sinuses and mastoid air cells are clear. Unremarkable orbits. Other: None. IMPRESSION: 1. No evidence of acute intracranial abnormality. 2. Chronic left thalamic infarct and chronic small vessel ischemic disease. Electronically Signed   By: Sebastian Ache M.D.   On: 06/11/2016 20:56   US Renal  Result Date: 06/12/2016 CLINICAL DATA:  Acute kidney injury, hypertension EXAM: RENAL / URINARY TRACT ULTRASOUND COMPLETE COMPARISON:  06/08/2013 FINDINGS: Right Kidney: Length: 10.2 cm. Normal cortical thickness. Increased cortical echogenicity. No mass, hydronephrosis or shadowing calcification. Left Kidney: Length: 10.8 cm. Normal cortical thickness. Increased cortical  echogenicity. No mass, hydronephrosis or shadowing calcification. Small amount of perinephric fluid at inferior pole. Bladder: Appears normal for degree of bladder distention. Incidentally noted minimal ascites adjacent to liver. IMPRESSION: Medical renal disease changes of both kidneys without mass or hydronephrosis. Minimal ascites. Electronically Signed   By: Ulyses Southward M.D.   On: 06/12/2016 10:06   Ct Pelvis Limited Wo Contrast  Result Date: 06/12/2016 CLINICAL DATA:  Left thigh pain after recent fall. Abnormal radiographs. EXAM: CT PELVIS WITHOUT CONTRAST TECHNIQUE: Multidetector CT imaging of the pelvis was performed following the standard protocol without intravenous contrast. COMPARISON:  Radiographs 06/11/2016. FINDINGS: Urinary Tract: Mild diffuse bladder wall thickening without apparent focal abnormality. The distal ureters are not well visualized, but do not appear dilated. Bowel: No bowel wall thickening, distention or focal surrounding inflammation identified in the pelvis. Vascular/Lymphatic: There are asymmetric mildly enlarged left external iliac and inguinal lymph nodes, measuring up to 12 mm short axis. There is edema surrounding the left external iliac and proximal femoral vessels. The left external iliac and femoral veins appear mildly distended with heterogeneous, generally increased density. Appearance is suspicious for DVT. Mild aortoiliac atherosclerosis noted. Reproductive: The  uterus appears unremarkable. No evidence of adnexal mass. Other: Moderate amount of free pelvic fluid. There is nonspecific soft tissue edema throughout the pelvis. Musculoskeletal: No evidence of acute fracture, dislocation or femoral head avascular necrosis. 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 This certainly has the potential to restrict external rotation of the hip. The hip joint spaces are maintained. There are mild sacroiliac degenerative  changes bilaterally. There is more advanced facet hypertrophy in the lower lumbar spine, worst on the right at L5-S1. Asymmetric diffuse atrophy of the right hip musculature is noted. IMPRESSION: 1. No acute osseous findings. 2. 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 This may restrict external rotation of the hip and is associated with diffuse right pelvic muscular atrophy. 3. Suspicion of left pelvic and femoral vein DVT. Correlation with venous Doppler recommended. 4. Nonspecific pelvic edema and mild left inguinal adenopathy. 5. These results will be called to the ordering clinician or representative by the Radiologist Assistant, and communication documented in the PACS or zVision Dashboard. Electronically Signed   By: Richardean Sale M.D.   On: 06/12/2016 11:19   Dg Chest Port 1 View  Result Date: 06/15/2016 CLINICAL DATA:  Intermittently productive cough for a few days. History of hypertension, previous CVA. EXAM: PORTABLE CHEST 1 VIEW COMPARISON:  Chest x-ray of Jun 11, 2016 FINDINGS: The lungs are slightly less well inflated today. There is increased density at both lung bases greatest on the left. The heart is top-normal in size. The pulmonary vascularity is not engorged. There is tortuosity of the descending thoracic aorta. The bony thorax exhibits no acute abnormality. IMPRESSION: Interval development of atelectasis or pneumonia at the lung bases greatest on the left. Followup PA and lateral chest X-ray is recommended in 3-4 weeks following trial of antibiotic therapy to ensure resolution and exclude underlying malignancy. Electronically Signed   By: David  Martinique M.D.   On: 06/15/2016 15:24   Dg Chest Port 1 View  Result Date: 06/11/2016 CLINICAL DATA:  Altered mental status EXAM: PORTABLE CHEST 1 VIEW COMPARISON:  06/08/2013 FINDINGS: Cardiac shadow is mildly enlarged. The lungs are well aerated bilaterally. No focal infiltrate or  sizable effusion is seen. Previously seen masslike densities are less well appreciated on the current exam. IMPRESSION: No acute abnormality seen. Electronically Signed   By: [name] M.D.   On: 06/11/2016 18:28   Dg Femur Min 2 Views Left  Result Date: 06/11/2016 CLINICAL DATA:  Left leg pain following recent fall EXAM: LEFT FEMUR 2 VIEWS COMPARISON:  None. FINDINGS: Degenerative changes of left hip joint are seen. No acute fracture or dislocation is noted. Vascular calcifications are seen. No soft tissue abnormality is noted. IMPRESSION: Degenerative change without acute abnormality. Electronically Signed   By: [name] M.D.   On: 06/11/2016 18:29    CT chest to rule out pneumonia 5/31       Filed Weights   06/12/16 0326  Weight: 77.1 kg (170 lb)     Microbiology: Recent Results (from the past 240 hour(s))  Culture, blood (routine x 2)     Status: None   Collection Time: 06/11/16  5:19 PM  Result Value Ref Range Status   Specimen Description BLOOD LEFT ANTECUBITAL  Final   Special Requests   Final    BOTTLES DRAWN AEROBIC AND ANAEROBIC Blood Culture adequate volume   Culture NO GROWTH 5 DAYS  Final   Report Status 06/16/2016  FINAL  Final  Culture, blood (routine x 2)     Status: None   Collection Time: 06/11/16  5:24 PM  Result Value Ref Range Status   Specimen Description BLOOD LEFT FOREARM  Final   Special Requests   Final    BOTTLES DRAWN AEROBIC AND ANAEROBIC Blood Culture results may not be optimal due to an inadequate volume of blood received in culture bottles   Culture  Setup Time   Final    GRAM POSITIVE RODS AEROBIC BOTTLE ONLY CRITICAL RESULT CALLED TO, READ BACK BY AND VERIFIED WITH: E MARTIN,PHARMD AT 1800 06/13/16 BY L BENFIELD    Culture   Final    GRAM POSITIVE RODS THE SIGNIFICANCE OF ISOLATING THIS ORGANISM FROM A SINGLE SET OF BLOOD CULTURES WHEN MULTIPLE SETS ARE DRAWN IS UNCERTAIN. PLEASE NOTIFY THE MICROBIOLOGY DEPARTMENT WITHIN ONE WEEK IF  SPECIATION AND SENSITIVITIES ARE REQUIRED.    Report Status 06/15/2016 FINAL  Final  Blood Culture ID Panel (Reflexed)     Status: None   Collection Time: 06/11/16  5:24 PM  Result Value Ref Range Status   Enterococcus species NOT DETECTED NOT DETECTED Final   Listeria monocytogenes NOT DETECTED NOT DETECTED Final   Staphylococcus species NOT DETECTED NOT DETECTED Final   Staphylococcus aureus NOT DETECTED NOT DETECTED Final   Streptococcus species NOT DETECTED NOT DETECTED Final   Streptococcus agalactiae NOT DETECTED NOT DETECTED Final   Streptococcus pneumoniae NOT DETECTED NOT DETECTED Final   Streptococcus pyogenes NOT DETECTED NOT DETECTED Final   Acinetobacter baumannii NOT DETECTED NOT DETECTED Final   Enterobacteriaceae species NOT DETECTED NOT DETECTED Final   Enterobacter cloacae complex NOT DETECTED NOT DETECTED Final   Escherichia coli NOT DETECTED NOT DETECTED Final   Klebsiella oxytoca NOT DETECTED NOT DETECTED Final   Klebsiella pneumoniae NOT DETECTED NOT DETECTED Final   Proteus species NOT DETECTED NOT DETECTED Final   Serratia marcescens NOT DETECTED NOT DETECTED Final   Haemophilus influenzae NOT DETECTED NOT DETECTED Final   Neisseria meningitidis NOT DETECTED NOT DETECTED Final   Pseudomonas aeruginosa NOT DETECTED NOT DETECTED Final   Candida albicans NOT DETECTED NOT DETECTED Final   Candida glabrata NOT DETECTED NOT DETECTED Final   Candida krusei NOT DETECTED NOT DETECTED Final   Candida parapsilosis NOT DETECTED NOT DETECTED Final   Candida tropicalis NOT DETECTED NOT DETECTED Final  Urine culture     Status: None   Collection Time: 06/11/16  9:07 PM  Result Value Ref Range Status   Specimen Description URINE, CLEAN CATCH  Final   Special Requests NONE  Final   Culture NO GROWTH  Final   Report Status 06/13/2016 FINAL  Final       Blood Culture    Component Value Date/Time   SDES URINE, CLEAN CATCH 06/11/2016 2107   SPECREQUEST NONE 06/11/2016  2107   CULT NO GROWTH 06/11/2016 2107   REPTSTATUS 06/13/2016 FINAL 06/11/2016 2107      Labs: Results for orders placed or performed during the hospital encounter of 06/11/16 (from the past 48 hour(s))  Comprehensive metabolic panel     Status: Abnormal   Collection Time: 06/16/16  3:08 AM  Result Value Ref Range   Sodium 137 135 - 145 mmol/L   Potassium 3.6 3.5 - 5.1 mmol/L   Chloride 104 101 - 111 mmol/L   CO2 25 22 - 32 mmol/L   Glucose, Bld 126 (H) 65 - 99 mg/dL   BUN 20 6 - 20 mg/dL  Creatinine, Ser 1.43 (H) 0.44 - 1.00 mg/dL   Calcium 8.6 (L) 8.9 - 10.3 mg/dL   Total Protein 6.8 6.5 - 8.1 g/dL   Albumin 1.8 (L) 3.5 - 5.0 g/dL   AST 102 (H) 15 - 41 U/L   ALT 44 14 - 54 U/L   Alkaline Phosphatase 133 (H) 38 - 126 U/L   Total Bilirubin 2.8 (H) 0.3 - 1.2 mg/dL   GFR calc non Af Amer 41 (L) >60 mL/min   GFR calc Af Amer 47 (L) >60 mL/min    Comment: (NOTE) The eGFR has been calculated using the CKD EPI equation. This calculation has not been validated in all clinical situations. eGFR's persistently <60 mL/min signify possible Chronic Kidney Disease.    Anion gap 8 5 - 15  Protime-INR     Status: Abnormal   Collection Time: 06/16/16  3:08 AM  Result Value Ref Range   Prothrombin Time 16.5 (H) 11.4 - 15.2 seconds   INR 1.32   Protime-INR     Status: Abnormal   Collection Time: 06/17/16  2:37 AM  Result Value Ref Range   Prothrombin Time 31.8 (H) 11.4 - 15.2 seconds   INR 3.00      Lipid Panel     Component Value Date/Time   CHOL 210 (H) 06/09/2013 0521   TRIG 104 06/09/2013 0521   HDL 58 06/09/2013 0521   CHOLHDL 3.6 06/09/2013 0521   VLDL 21 06/09/2013 0521   LDLCALC 131 (H) 06/09/2013 0521     Lab Results  Component Value Date   HGBA1C 5.3 06/09/2013     Lab Results  Component Value Date   LDLCALC 131 (H) 06/09/2013   CREATININE 1.43 (H) 06/16/2016     HPI :   56 y.o.femalewith medical history significant of hypertension, CVA with  right-sided hemiplegia who was brought to the emergency department with left lower extremity pain after having a fall 2 days ago, followed by drowsiness since yesterday. Patient presented with altered mental status and left leg pain. Admitted for left leg swelling, acute kidney injury, found to have extensive DVT of the left leg.  HOSPITAL COURSE:   Left leg pain,doubt cellulitis or sepsis  Continue telemetry, admission lactic acid within normal limits,   Started on  vancomycin and Zosyn for presumed cellulitis Blood culture showed gram-positive rods, essentially contaminant Leukocytosis   improving Primary issue seems to be extensive DVT    AKI (acute kidney injury) ([place]) presented with a creatinine of 5.22>now 1.51>1.43 Hold Micardis HCT. Hyponatremia likely secondary to HCTZ,resolved  Hold the spironolactone in the setting of dehydration. Renal ultrasound shows medical renal disease without any hydronephrosis Renal function is improving,bolus today due to low BP Creatinine improving slowly, baseline unknown but probably within normal range based on 06/10/13 CK normal today, IV fluids discontinued, patient encouraged to eat and drink   CVA, old, hemiparesis ([place]) Supportive care. Continue prophylactic phenytoin 100 mg by mouth 3 times a day. Phenytoin level is pending  HTN (hypertension) Continue metoprolol 100 mg by mouth twice a day. Reduce  clonidine 0.2 mg by mouth 2 times a day Hold Micardis, blood pressure soft May resume Aldactone    Extensive pelvic and B/L LE DVT's  Left - Positive for acute DVT and age indeterminate DVT in the iliac, common femoral, femoral, and popliteal veins. There is also superficial thromobosis of the saphenofemoral junction and the proximal greater saphenous vein. Right - Positive for acute DVT and age indeterminate DVT of  the iliac, common femoral, femoral, and popliteal veins  Patient has been on heparin drip since 5/26. Now transition  to Coumadin with Lovenox bridge being managed by pharmacy Not a candidate for thrombolysis due to renal insufficiency, per vascular surgery Dr. Donnetta Hutching Patient would need outpatient age-appropriate screening for malignancy to be arranged for by PCP   Cough Chest x-ray cannot rule out pneumonia Patient empirically started on Augmentin CT chest cannot Completely rule out pneumonia, continue empiric antibiotics  ANEMIA Hg at baseline of 7.9>8.1 7.4 prior to dc  transfuse for hg <7  Discharge Exam:   Blood pressure 129/87, pulse (!) 101, temperature 98.4 F (36.9 C), temperature source Oral, resp. rate 18, height _0  (1.702 m), weight 77.1 kg (170 lb), SpO2 97 %.   Alert low grade fever comfortable.  Lungs: diminished at bases, no wheezing or rhonchi.  Cardiovascular:Regular rate and rhythm, no murmurs / rubs / gallops. No extremity edema. 2+ pedal pulses. No carotid bruits.  Abdomen:Soft, no tenderness, no masses palpated. No hepatosplenomegaly. Bowel sounds positive.  Musculoskeletal: swelling of the left lower extremity Skin no rashes Neurologic:CN 2-12 grossly intact. Sensation intact, Right hemiplegia.      SignedReyne Dumas 06/17/2016, 8:26 AM        Time spent >1 hour\

## 2016-06-17 NOTE — Care Management Note (Signed)
Case Management Note  Patient Details  Name: Ann Lynch MRN: [id number] Date of Birth: [birth year]  Subjective/Objective:                 Patient with order to DC to CIR. No CM needs identified.    Action/Plan:   Expected Discharge Date:  06/17/16               Expected Discharge Plan:  [place]  In-House Referral:     Discharge planning Services  CM Consult  Post Acute Care Choice:    Choice offered to:     DME Arranged:    DME Agency:     HH Arranged:    HH Agency:     Status of Service:  Completed, signed off  If discussed at [employer], dates discussed:    Additional Comments:  Carles Collet, RN 06/17/2016, 11:22 AM

## 2016-06-18 ENCOUNTER — Inpatient Hospital Stay (HOSPITAL_COMMUNITY): Payer: Medicare HMO

## 2016-06-18 ENCOUNTER — Encounter (HOSPITAL_COMMUNITY): Payer: Self-pay | Admitting: Nurse Practitioner

## 2016-06-18 ENCOUNTER — Inpatient Hospital Stay (HOSPITAL_COMMUNITY): Payer: Medicare HMO | Admitting: Physical Therapy

## 2016-06-18 DIAGNOSIS — D72829 Elevated white blood cell count, unspecified: Secondary | ICD-10-CM

## 2016-06-18 LAB — CBC WITH DIFFERENTIAL/PLATELET
BASOS ABS: 0 10*3/uL (ref 0.0–0.1)
Basophils Relative: 0 %
Eosinophils Absolute: 0.2 10*3/uL (ref 0.0–0.7)
Eosinophils Relative: 1 %
HCT: 23.7 % — ABNORMAL LOW (ref 36.0–46.0)
HEMOGLOBIN: 7.7 g/dL — AB (ref 12.0–15.0)
LYMPHS ABS: 1.6 10*3/uL (ref 0.7–4.0)
LYMPHS PCT: 11 %
MCH: 30.1 pg (ref 26.0–34.0)
MCHC: 32.5 g/dL (ref 30.0–36.0)
MCV: 92.6 fL (ref 78.0–100.0)
Monocytes Absolute: 1 10*3/uL (ref 0.1–1.0)
Monocytes Relative: 7 %
NEUTROS ABS: 12.1 10*3/uL — AB (ref 1.7–7.7)
Neutrophils Relative %: 81 %
Platelets: 367 10*3/uL (ref 150–400)
RBC: 2.56 MIL/uL — AB (ref 3.87–5.11)
RDW: 14.4 % (ref 11.5–15.5)
WBC: 14.9 10*3/uL — AB (ref 4.0–10.5)

## 2016-06-18 LAB — COMPREHENSIVE METABOLIC PANEL
ALK PHOS: 121 U/L (ref 38–126)
ALT: 36 U/L (ref 14–54)
AST: 77 U/L — AB (ref 15–41)
Albumin: 1.9 g/dL — ABNORMAL LOW (ref 3.5–5.0)
Anion gap: 8 (ref 5–15)
BUN: 20 mg/dL (ref 6–20)
CALCIUM: 9 mg/dL (ref 8.9–10.3)
CO2: 26 mmol/L (ref 22–32)
CREATININE: 1.59 mg/dL — AB (ref 0.44–1.00)
Chloride: 103 mmol/L (ref 101–111)
GFR, EST AFRICAN AMERICAN: 42 mL/min — AB (ref >60)
GFR, EST NON AFRICAN AMERICAN: 36 mL/min — AB (ref >60)
Glucose, Bld: 160 mg/dL — ABNORMAL HIGH (ref 65–99)
Potassium: 4.7 mmol/L (ref 3.5–5.1)
Sodium: 137 mmol/L (ref 135–145)
Total Bilirubin: 1.8 mg/dL — ABNORMAL HIGH (ref 0.3–1.2)
Total Protein: 7.4 g/dL (ref 6.5–8.1)

## 2016-06-18 LAB — PROTIME-INR
INR: 5.86 — AB
PROTHROMBIN TIME: 54.3 s — AB (ref 11.4–15.2)

## 2016-06-18 MED ORDER — OXYCODONE-ACETAMINOPHEN 5-325 MG PO TABS
1.0000 | ORAL_TABLET | ORAL | Status: DC | PRN
  Administered 2016-06-18 – 2016-06-26 (×12): 1 via ORAL
  Filled 2016-06-18 (×13): qty 1

## 2016-06-18 NOTE — Progress Notes (Signed)
Patient information reviewed and entered into eRehab system by Jakyren Fluegge, RN, CRRN, PPS Coordinator.  Information including medical coding and functional independence measure will be reviewed and updated through discharge.     Per nursing patient was given "Data Collection Information Summary for Patients in Inpatient Rehabilitation Facilities with attached "Privacy Act Statement-Health Care Records" upon admission.  

## 2016-06-18 NOTE — Progress Notes (Addendum)
[place] for heparin> lovenox/coumadin Indication: DVT (extensive bilateral DVTs)  No Known Allergies  Patient Measurements: Height: 5\' 7"  (170.2 cm) Weight: 177 lb 3.2 oz (80.4 kg) IBW/kg (Calculated) : 61.6 Heparin Dosing Weight: 77 kg  Vital Signs: Temp: 98.7 F (37.1 C) (06/01 0457) Temp Source: Oral (06/01 0457) BP: 122/85 (06/01 0457) Pulse Rate: 92 (06/01 0457)  Labs:  Recent Labs  06/16/16 0308 06/17/16 0237 06/17/16 1007 06/18/16 0907  HGB  --   --  7.4* 7.7*  HCT  --   --  22.7* 23.7*  PLT  --   --  292 367  LABPROT 16.5* 31.8*  --  54.3*  INR 1.32 3.00  --  5.86*  CREATININE 1.43*  --   --  1.59*     Medical History: Past Medical History:  Diagnosis Date  . Hypertension   . Stroke [facility])      Assessment: 56 yo female with extensive bilateral DVTs in R and L lower extremities, and started on IV heparin. Avoiding Xarelto due to significant interaction with phenytoin.  Phenytoin is a strong CYP3A4 and P-gp inducer and should not be used with DOACs (although there is little data with edoxaban).  Plans for anticoagulation are lovenox transition to coumadin with minimum of 5 day overlap.   Today is D#4 overlap, INR with sharp rise (1.32 >3>5.86) after 7.5mg  on 5/29 and 5mg  on 5/30.  Drug interactions noted with Phenytoin and Augmentin therapy likely contributing to elevated INR as well as liver function. LFTs are mildly elevated, trending down now. 102/44 > 77/36, Po Intake 50-75% - adequate. SCr 1.59, overall trending down estimated CrCl ~45 mL/min. Pt received lovenox 1mg /kg this morning Hgb 7.7, low stable, pltc 367K. No bleeding note per RN   Goal of Therapy:  INR 2-3 Anti-Xa level 0.6-1 units/ml 4hrs after LMWH dose given Monitor platelets by anticoagulation protocol: Yes   Plan:  -Hold Coumadin today due to sharp rise in INR and interacting medications -Daily PT/INR -Given sharp INR increase, and low hgb,  borderline poor renal function. Will hold for now -Pt will need minimum 5 day lovenox/coumadin overlap, therefore will consider restart lovenox tomorrow if INR is trending down generously.  -Consider holding statin therapy with rising LFTs   Maryanna Shape, PharmD, BCPS  Clinical Pharmacist  Pager: [phone]    06/18/2016 12:33 PM

## 2016-06-18 NOTE — Progress Notes (Signed)
[place] PHYSICAL MEDICINE & REHABILITATION     PROGRESS NOTE    Subjective/Complaints: Had a reasonable night. Feels much better since emptying bowels yesterday. Ate breakfast this morning. Ready for therapy  ROS: pt denies nausea, vomiting, diarrhea, cough, shortness of breath or chest pain   Objective: Vital Signs: Blood pressure 122/85, pulse 92, temperature 98.7 F (37.1 C), temperature source Oral, resp. rate 18, height 5\' 7"  (1.702 m), weight 80.4 kg (177 lb 3.2 oz), SpO2 97 %. Ct Chest Wo Contrast  Result Date: 06/17/2016 CLINICAL DATA:  Inpatient.  Fever.  Cough.  History of CVA. EXAM: CT CHEST WITHOUT CONTRAST TECHNIQUE: Multidetector CT imaging of the chest was performed following the standard protocol without IV contrast. COMPARISON:  06/15/2016 chest radiograph. FINDINGS: Cardiovascular: Top-normal heart size. No significant pericardial fluid/thickening. Left main coronary atherosclerosis. Minimally atherosclerotic nonaneurysmal thoracic aorta. Normal caliber pulmonary arteries. Mediastinum/Nodes: Hypodense 1.0 cm superior right thyroid lobe nodule. Unremarkable esophagus. No pathologically enlarged axillary, mediastinal or gross hilar lymph nodes, noting limited sensitivity for the detection of hilar adenopathy on this noncontrast study. Lungs/Pleura: No pneumothorax. Small dependent right pleural effusion. Trace dependent left pleural effusion. There is platelike segmental atelectasis in the peripheral basilar left lower lobe. There is subsegmental lingular atelectasis. There is consolidation and volume loss in the dependent right lower lobe. No discrete lung masses or significant pulmonary nodules in the aerated portions of the lungs. Upper abdomen: Unremarkable. Musculoskeletal: No aggressive appearing focal osseous lesions. Mild thoracic spondylosis. IMPRESSION: 1. Small right and trace left dependent pleural effusions . 2. Consolidation and volume loss in the dependent right  lower lobe, favor compressive atelectasis, difficult to exclude a component of pneumonia. 3. Subsegmental lingular and segmental left lower lobe atelectasis . 4. Aortic atherosclerosis.  Left main coronary atherosclerosis. Electronically Signed   By: Ilona Sorrel M.D.   On: 06/17/2016 10:44    Recent Labs  06/17/16 1007  WBC 14.1*  HGB 7.4*  HCT 22.7*  PLT 292    Recent Labs  06/16/16 0308  NA 137  K 3.6  CL 104  GLUCOSE 126*  BUN 20  CREATININE 1.43*  CALCIUM 8.6*   CBG (last 3)  No results for input(s): GLUCAP in the last 72 hours.  Wt Readings from Last 3 Encounters:  06/17/16 80.4 kg (177 lb 3.2 oz)  06/12/16 77.1 kg (170 lb)    Physical Exam:  Constitutional:  56 year old right-handed female appearing older than stated age Eyes:  Pupils reactive to light Neck: Normal range of motion. Neck supple. No thyromegalypresent.  Cardiovascular: Normal rateand regular rhythm.  Respiratory:  Decreased breath sounds at the bases but clear to auscultation GI: abd soft NT/ND Skin. Warm and dry Musculoskeletal: She exhibits edema.  Bilateral LE L>R tr edema   with minimal tenderness. Neurological: She is alert.  Patient is oriented to person, place and time. She provides her date of birth. Fair awareness of deficits. Dense RUE hemiparesis. RLE 1-2/5 prox to distal with extensor pattern. Dysconjugate gaze. 1-2/4 tone RLE. 3beats clonus right ankle---neuro exam unchanged Psych: pt cooperative but flat  Assessment/Plan: 1. Functional deficits secondary to debility after bilateral LE DVT's which require 3+ hours per day of interdisciplinary therapy in a comprehensive inpatient rehab setting. Physiatrist is providing close team supervision and 24 hour management of active medical problems listed below. Physiatrist and rehab team continue to assess barriers to discharge/monitor patient progress toward functional and medical goals.  Function:  Bathing Bathing position  Bathing parts      Bathing assist        Upper Body Dressing/Undressing Upper body dressing                    Upper body assist        Lower Body Dressing/Undressing Lower body dressing                                  Lower body assist        Toileting Toileting          Toileting assist     Transfers Chair/bed transfer             Locomotion Ambulation           Wheelchair          Cognition Comprehension Comprehension assist level: Follows basic conversation/direction with no assist  Expression Expression assist level: Expresses basic needs/ideas: With no assist  Social Interaction Social Interaction assist level: Interacts appropriately with others with medication or extra time (anti-anxiety, antidepressant).  Problem Solving Problem solving assist level: Solves basic problems with no assist  Memory Memory assist level: Recognizes or recalls 90% of the time/requires cueing < 10% of the time   Medical Problem List and Plan: 1. Debilitationsecondary to bilateral lower extremity DVTs with history of prior left CVA with right hemiparesis -begin therapies today 2. DVT Prophylaxis/Anticoagulation: Lovenox/Coumadin.--INR supratherapeutic yesterday (INR 3.0)  -appreciate pharmacy mgt 3. Pain Management: Neurontin 400 mg 3 times a day, Elavil 25 mg daily at bedtime, Cymbalta 20 mg daily Oxycodone as needed 4. Mood: Provide emotional support 5. Neuropsych: This patient iscapable of making decisions on herown behalf. 6. Skin/Wound Care: Routine skin checks 7. Fluids/Electrolytes/Nutrition: encourage PO 8.Hypertension. Lopressor 100 mg twice a day,clonidine 0.2 mg twice a day. Monitor with increased mobility 9.Seizure disorder.Dilantin 100 mg TID 10.AKI/hyponatremia.Renal ultrasound negative. Follow-up chemistries. 11.Leukocytosis. Latest WBC 14,000. Chest x-ray atelectasis versus pneumonia. Low-grade fever  secondary to question pneumonia versus extensive DVTs. Continue Augmentin initiated 06/16/2016 -follow labs and clinically   12. Constipation: resolved -continue with scheduled softener and laxative 13. ABLA: hgb as ranged from 7.4-8.5 over the last week  -no signs of blood loss grossly but INR supratherapeuric   -follow serial hgb's   LOS (Days) 1 A FACE TO FACE EVALUATION WAS PERFORMED  Alger Simons T, MD 06/18/2016 9:11 AM

## 2016-06-18 NOTE — Progress Notes (Signed)
CRITICAL VALUE ALERT  Critical Value:  INR 5.86  Date & Time Notied:  06/18/16 1055  Provider Notified: Marlowe Shores, PA  Orders Received/Actions taken: pharmacy is dosing patient, no orders at this time.

## 2016-06-18 NOTE — Evaluation (Addendum)
Occupational Therapy Assessment and Plan  Patient Details  Name: Ann Lynch MRN: [id number] Date of Birth: 01/02/1961  OT Diagnosis: abnormal posture, acute pain, cognitive deficits, flaccid hemiplegia and hemiparesis and muscle weakness (generalized) Rehab Potential:   ELOS: 7-10   Today's Date: 06/18/2016 OT Individual Time: 0900-1015 OT Individual Time Calculation (min): 75 min     Problem List:  Patient Active Problem List   Diagnosis Date Noted  . Debilitated 06/17/2016  . DVT of lower extremity, bilateral ([place]) 06/17/2016  . Sepsis due to cellulitis ([place]) 06/11/2016  . Aphasia 06/08/2013  . Slurred speech 06/08/2013  . AKI (acute kidney injury) ([place]) 06/08/2013  . CVA, old, hemiparesis ([place]) 06/08/2013  . HTN (hypertension) 06/08/2013    Past Medical History:  Past Medical History:  Diagnosis Date  . Hypertension   . Stroke [facility])    Past Surgical History:  Past Surgical History:  Procedure Laterality Date  . TRACHEOSTOMY      Assessment & Plan Clinical Impression: Ann Stang Guy-Dozieris a 56 y.o.right handed femalewith history of hypertension,Seizure disorder,CVA2009with right-sided residual weakness maintained on aspirin.Per chart review patient lives with mother and essentially wheelchair bound however she was able to perform stand pivot transfers with assistance.Presented 06/11/2016 with left lower extremity pain after recent fall. CT of the head negative for acute changes. CT of the pelvis showed suspicion of left pelvic and femoral vein DVT. Bilateral lower extremity Doppler showed left-positive acute DVT indeterminate age in the iliac, common femoral, femoral and popliteal veins. Also superficial thrombosis of the saphenofemoral junction and the proximal greater saphenous vein. Right positive DVT iliac, common femoral, femoral popliteal veins. Placed on Lovenox with bridging transition to Coumadin.. Findings of elevated creatinine 5.22 on admission  and sodium 130. Renal ultrasound negative for hydronephrosis. Latest renal function 2015 within normal limits. Myocarditis was held as well as HCTZ. Vascular surgery follow-up for bilateral DVTs not a candidate for thrombo-lysis due to renal insufficiency. Renal function normalizing 1.43.Patient spiked low-grade fever question related to DVTs. WBC 11,700. Chest x-ray showed atelectasis versus pneumonia at the bases greater on the left.CT of the chest showed small right and trace left dependent pleural effusions. Consolidation and volume loss in the dependent right lower lobe favoring compressive atelectasis.Placed on Augmentin 05/30/2018for empiric coverage.Physical and occupational therapy evaluation completed 06/15/2016 with recommendations of physical medicine rehabilitation consult.Patient was admitted for a comprehensive rehabilitation program .    Patient currently requires mod-max A with basic self-care skills secondary to muscle weakness, decreased cardiorespiratoy endurance, abnormal tone and decreased safety awareness.  Prior to hospitalization, patient could complete BADLs with min.  Patient will benefit from skilled intervention to decrease level of assist with basic self-care skills prior to discharge home with care partner.  Anticipate patient will require minimal physical assistance and follow up home health.  OT - End of Session Endurance Deficit: Yes OT Assessment OT Patient demonstrates impairments in the following area(s): Balance;Cognition;Endurance;Motor;Pain;Safety;Vision OT Basic ADL's Functional Problem(s): Grooming;Bathing;Dressing;Toileting OT Transfers Functional Problem(s): Toilet;Tub/Shower OT Plan OT Intensity: Minimum of 1-2 x/day, 45 to 90 minutes OT Frequency: 5 out of 7 days OT Duration/Estimated Length of Stay: 8-12 OT Treatment/Interventions: Balance/vestibular training;Cognitive remediation/compensation;Discharge planning;Disease  mangement/prevention;DME/adaptive equipment instruction;Functional mobility training;Pain management;Patient/family education;Psychosocial support;Self Care/advanced ADL retraining;Therapeutic Activities;Therapeutic Exercise;UE/LE Strength taining/ROM;UE/LE Coordination activities;Wheelchair propulsion/positioning OT Self Feeding Anticipated Outcome(s): MOD I OT Basic Self-Care Anticipated Outcome(s): MIN - MOD A OT Toileting Anticipated Outcome(s): MAX A toileting, MIN A shower OT Bathroom Transfers Anticipated Outcome(s): MIN A OT Recommendation Patient  destination: Home Follow Up Recommendations: Home health OT Equipment Recommended: To be determined   Skilled Therapeutic Intervention 1:1. Pt educated on OT role/purpose, POC, CIR< and ELOS. Pt Supine HOB elevated>EOB with A for LE mangaement and trunk facilitation. With heavy use of grab bar, Pt stand pivot transfer EOB>w/c<>BSC<>TTB with MOD A for lifting (MAX A for TTB transfer), VC for sequencing and safety awareness with R knee blocking. Pt req A for clothing management while toileting, however able to complete hygiene. While seated on TTB pt bathes with A to wash LUE and B feet. Pt dons L sock and stands at sink for OT to don brief. Pt does not have clothes and dons hospital gown. Pt reporting LLE pain. RN aware. Exited session with pt seated in w/c with call light in reach and all needs met.  OT Evaluation Precautions/Restrictions  Precautions Precautions: Fall Precaution Comments: right hemiplegia Restrictions Weight Bearing Restrictions: No General Chart Reviewed: Yes Vital Signs   Pain Pain Assessment Pain Assessment: No/denies pain Pain Score: 0-No pain Home Living/Prior Functioning Home Living Available Help at Discharge: Available 24 hours/day, Family Type of Home: House Home Access: Ramped entrance Home Layout: One level Bathroom Shower/Tub: Tub/shower unit, Theatre stage manager Accessibility: Yes Additional Comments:  per mom the BSC and hospital bed need to be replaced due to poor condition from wear  Lives With: Family IADL History Homemaking Responsibilities: No Mode of Transportation: Family Leisure and Hobbies: watch tv-comedies IADL Comments: tries to keep room tidy Prior Function Level of Independence: Needs assistance with ADLs (pt requires A to complete clothing management) Comments: pt was transferring on her own until approximately 10 days prior to admission when she required help from mom due to weakness and pain.  ADL   Vision Baseline Vision/History: Wears glasses Wears Glasses: Reading only Patient Visual Report: No change from baseline Additional Comments: diconjugate gaze (occasional double vision), decreased tracking Perception  Perception:  (at baseline) Praxis Praxis: Intact Cognition Overall Cognitive Status: (P) History of cognitive impairments - at baseline Arousal/Alertness: (P) Awake/alert Orientation Level: (P) Person;Situation;Place Person: (P) Oriented Place: (P) Oriented Situation: (P) Oriented Year: (P) 2018 Month: (P) June Day of Week: (P) Incorrect Immediate Memory Recall: (P) Sock;Blue;Bed Memory Recall: (P) Blue Memory Recall Blue: (P) Without Cue Sensation Sensation Light Touch:  (intact per pt report) Coordination Gross Motor Movements are Fluid and Coordinated: No Fine Motor Movements are Fluid and Coordinated: No Motor  Motor Motor: Hemiplegia (from prior stroke) Mobility  Transfers Transfers: Sit to Stand Sit to Stand: 3: Mod assist  Trunk/Postural Assessment  Cervical Assessment Cervical Assessment: Exceptions to [facility] (head forward) Thoracic Assessment Thoracic Assessment: Exceptions to [facility] (kyphotic) Lumbar Assessment Lumbar Assessment: Exceptions to [facility] (posterior pelvic tilt) Postural Control Postural Control: Within Functional Limits  Balance Balance Balance Assessed: Yes Dynamic Sitting Balance Dynamic Sitting - Level of  Assistance: 5: Stand by assistance Sitting balance - Comments: bathing Static Standing Balance Static Standing - Level of Assistance: 4: Min assist Static Standing - Comment/# of Minutes: dressing Extremity/Trunk Assessment RUE Assessment RUE Assessment: Exceptions to [facility] (flaccid hemi plegia) LUE Assessment LUE Assessment: Within Functional Limits   See Function Navigator for Current Functional Status.   Refer to Care Plan for Long Term Goals  Recommendations for other services: None    Discharge Criteria: Patient will be discharged from OT if patient refuses treatment 3 consecutive times without medical reason, if treatment goals not met, if there is a change in medical status, if patient makes no  progress towards goals or if patient is discharged from hospital.  The above assessment, treatment plan, treatment alternatives and goals were discussed and mutually agreed upon: by patient  Tonny Branch 06/18/2016, 10:38 AM

## 2016-06-18 NOTE — Evaluation (Signed)
Physical Therapy Assessment and Plan  Patient Details  Name: Ann Lynch MRN: [id number] Date of Birth: 1960/06/30  PT Diagnosis: Abnormal posture, Hemiplegia dominant, Hypotonia, Impaired cognition, Impaired sensation, Muscle weakness, Pain in left lower extremity and Paralysis Rehab Potential: Good ELOS: 8-12 days   Today's Date: 06/18/2016 PT Individual Time: 1100-1200 PT Individual Time Calculation (min): 60 min    Problem List:  Patient Active Problem List   Diagnosis Date Noted  . Debilitated 06/17/2016  . DVT of lower extremity, bilateral ([place]) 06/17/2016  . Sepsis due to cellulitis ([place]) 06/11/2016  . Aphasia 06/08/2013  . Slurred speech 06/08/2013  . AKI (acute kidney injury) ([place]) 06/08/2013  . CVA, old, hemiparesis ([place]) 06/08/2013  . HTN (hypertension) 06/08/2013    Past Medical History:  Past Medical History:  Diagnosis Date  . Hypertension   . Stroke [facility])    Past Surgical History:  Past Surgical History:  Procedure Laterality Date  . TRACHEOSTOMY      Assessment & Plan   HPI: Ann Marsico Guy-Dozieris a 56 y.o.right handed femalewith history of hypertension,Seizure disorder, CVA2009with right-sided residual weakness maintained on aspirin.Per chart review patient lives with mother and essentially wheelchair bound however she was able to perform stand pivot transfers with assistance.Presented 06/11/2016 with left lower extremity pain after recent fall. CT of the head negative for acute changes. CT of the pelvis showed suspicion of left pelvic and femoral vein DVT. Bilateral lower extremity Doppler showed left-positive acute DVT indeterminate age in the iliac, common femoral, femoral and popliteal veins. Also superficial thrombosis of the saphenofemoral junction and the proximal greater saphenous vein. Right positive DVT iliac, common femoral, femoral popliteal veins. Placed on Lovenox with bridging transition to Coumadin.. Findings of elevated  creatinine 5.22 on admission and sodium 130. Renal ultrasound negative for hydronephrosis. Latest renal function 2015 within normal limits. Myocarditis was held as well as HCTZ. Vascular surgery follow-up for bilateral DVTs not a candidate for thrombo-lysis due to renal insufficiency. Renal function normalizing 1.43.Patient spiked low-grade fever question related to DVTs. WBC 11,700. Chest x-ray showed atelectasis versus pneumonia at the bases greater on the left.CT of the chest showed small right and trace left dependent pleural effusions. Consolidation and volume loss in the dependent right lower lobe favoring compressive atelectasis. Placed on Augmentin 06/16/2016 for empiric coverage. Physical and occupational therapy evaluation completed 06/15/2016 with recommendations of physical medicine rehabilitation consult. Patient was admitted for a comprehensive rehabilitation program. Patient transferred to CIR on 06/17/2016 .   Patient currently requires max with mobility secondary to muscle weakness and muscle paralysis, decreased cardiorespiratoy endurance, impaired timing and sequencing and abnormal tone, decreased memory and decreased standing balance, decreased postural control, hemiplegia and decreased balance strategies.  Prior to hospitalization, patient was min with mobility and lived with Family in a House home.  Home access is  Ramped entrance.  Patient will benefit from skilled PT intervention to maximize safe functional mobility, minimize fall risk and decrease caregiver burden for planned discharge home with 24 hour assist.  Anticipate patient will benefit from follow up [facility] at discharge.  PT - End of Session Endurance Deficit: Yes PT Assessment Rehab Potential (ACUTE/IP ONLY): Good PT Patient demonstrates impairments in the following area(s): Balance;Pain;Motor;Endurance PT Transfers Functional Problem(s): Bed Mobility;Bed to Chair;Car;Furniture PT Locomotion Functional Problem(s): Wheelchair  Mobility;Ambulation PT Plan PT Intensity: Minimum of 1-2 x/day ,45 to 90 minutes PT Frequency: 5 out of 7 days PT Duration Estimated Length of Stay: 8-12 days PT Treatment/Interventions: Ambulation/gait training;Balance/vestibular  training;Cognitive remediation/compensation;Community reintegration;Discharge planning;Disease management/prevention;DME/adaptive equipment instruction;Neuromuscular re-education;Functional mobility training;Functional electrical stimulation;Pain management;Patient/family education;Skin care/wound management;Therapeutic Exercise;Therapeutic Activities;UE/LE Strength taining/ROM;UE/LE Coordination activities;Wheelchair propulsion/positioning;Splinting/orthotics;Visual/perceptual remediation/compensation PT Transfers Anticipated Outcome(s): min A to S for bed/chair<>w/c, car transfers PT Locomotion Anticipated Outcome(s): S w/c propulsion; max A gait in controlled environment  PT Recommendation Patient destination: Home  Skilled Therapeutic Intervention Pt received seated in w/c. Pt total A in w/c to ortho gym. Pt max A for stand pivot transfer from w/c>mat with no AD. Sit>stand x2 with max A standing with manual facilitation on R side due to R sided hemiplegia and verbal cues to stand up straight. Max A stand pivot car transfer at sedan-height. Pt mod A for w/c propulsion approximately 163f with pt using her LUE for propulsion and requiring assistance to steer the w/c. Pt having LLE pain during session, so she declined using her LLE to assist with w/c propulsion; pt reports typically using LUE and LLE for w/c propulsion at home. Max A stand pivot transfer from w/c to bed and mod A for bed mobility for management of RLE, including rolling R/L and sit>supine. Manual facilitation at R knee and foot during all stand pivot transfers to increase stability during transfers. Pt has required assistance with transfers prior to admission and able to direct transfers with min A. Discussed  goals of returning to PLOF with min A for transfers. Pt left supine in bed with 3 bedrails and all needs in reach.   PT Evaluation Precautions/Restrictions Precautions Precautions: Fall Precaution Comments: right hemiplegia Restrictions Weight Bearing Restrictions: No General Chart Reviewed: Yes Response to Previous Treatment: Patient with no complaints from previous session. Family/Caregiver Present: No  Vital SignsTherapy Vitals Temp: 97.9 F (36.6 C) Temp Source: Oral Pulse Rate: 85 Resp: 18 BP: 115/79 Patient Position (if appropriate): Sitting Oxygen Therapy SpO2: 100 % O2 Device: Not Delivered Pain Pain Assessment Pain Assessment: 0-10 Pain Score: 6  Pain Type: Acute pain Pain Location: Leg Pain Orientation: Right;Left Pain Descriptors / Indicators: Aching Pain Onset: On-going Patients Stated Pain Goal: 3 Pain Intervention(s): Medication (See eMAR) Home Living/Prior Functioning Home Living Available Help at Discharge: Available 24 hours/day Type of Home: House Home Access: Ramped entrance Home Layout: One level  Lives With: Family Prior Function Level of Independence: Needs assistance with tranfers;Needs assistance with ADLs  Able to Take Stairs?: No Driving: No Vision/Perception  Perception Perception:  (at baseline) Praxis Praxis: Intact  Cognition Overall Cognitive Status: History of cognitive impairments - at baseline Arousal/Alertness: Awake/alert Orientation Level: Oriented X4 Safety/Judgment: Appears intact Sensation Sensation Light Touch: Impaired Detail Light Touch Impaired Details: Impaired RLE Motor  Motor Motor: Hemiplegia (R sided)  Mobility Bed Mobility Bed Mobility: Rolling Right;Rolling Left;Supine to Sit;Sit to Supine Rolling Right: 3: Mod assist Rolling Right Details: Tactile cues for sequencing;Tactile cues for weight shifting;Manual facilitation for weight shifting;Manual facilitation for placement Rolling Left: 3: Mod  assist Rolling Left Details: Tactile cues for sequencing;Tactile cues for weight shifting;Manual facilitation for weight shifting;Manual facilitation for placement Supine to Sit: 3: Mod assist Sit to Supine: 3: Mod assist (assist with R LE ) Transfers Transfers: Yes Sit to Stand: 2: Max assist Sit to Stand Details: Tactile cues for initiation;Tactile cues for sequencing;Tactile cues for placement;Tactile cues for posture;Verbal cues for sequencing;Verbal cues for technique;Manual facilitation for weight shifting;Manual facilitation for placement;Manual facilitation for weight bearing Stand Pivot Transfers: 2: Max assist Stand Pivot Transfer Details: Tactile cues for initiation;Tactile cues for sequencing;Tactile cues for weight shifting;Tactile cues for posture;Verbal cues for  sequencing;Verbal cues for technique;Manual facilitation for weight shifting;Manual facilitation for placement Locomotion  Ambulation Ambulation: No Wheelchair Mobility Wheelchair Mobility: Yes Wheelchair Assistance: 3: Mod assist Wheelchair Assistance Details: Verbal cues for sequencing;Manual facilitation for placement (manual facilitation to steer w/c) Wheelchair Propulsion: Left upper extremity Wheelchair Parts Management: Needs assistance Distance: 113f  Trunk/Postural Assessment  Cervical Assessment Cervical Assessment: Exceptions to [facility](head forward) Thoracic Assessment Thoracic Assessment: Exceptions to [facility](kyphotic) Lumbar Assessment Lumbar Assessment: Exceptions to [facility](posterior pelvic tilt) Postural Control Postural Control: Within Functional Limits  Balance Dynamic Sitting Balance Dynamic Sitting - Level of Assistance: 5: Stand by assistance Static Standing Balance Static Standing - Level of Assistance: 3: Mod assist Extremity Assessment  RUE Assessment RUE Assessment: Exceptions to [facility](flaccid hemi plegia) LUE Assessment LUE Assessment: Within Functional Limits RLE Assessment RLE  Assessment: Exceptions to [facility](R sided hemiplegia; grossly 0/5) LLE Assessment LLE Assessment: Exceptions to [facility] Strength Left Hip Flexion: 3+/5 Left Hip ABduction: 4/5 Left Hip ADduction: 4/5 (increased LLE pain ) Left Knee Flexion: 4+/5 Left Knee Extension: 4/5 Left Ankle Dorsiflexion: 4/5 Left Ankle Plantar Flexion: 4/5   See Function Navigator for Current Functional Status.   Refer to Care Plan for Long Term Goals  Recommendations for other services: Therapeutic Recreation  Pet therapy and Kitchen group  Discharge Criteria: Patient will be discharged from PT if patient refuses treatment 3 consecutive times without medical reason, if treatment goals not met, if there is a change in medical status, if patient makes no progress towards goals or if patient is discharged from hospital.  The above assessment, treatment plan, treatment alternatives and goals were discussed and mutually agreed upon: by patient  CAlysia Penna6/01/2016, 3:48 PM

## 2016-06-18 NOTE — Progress Notes (Signed)
Occupational Therapy Note  Patient Details  Name: Ann Lynch MRN: [id number] Date of Birth: August 18, 1960  Today's Date: 06/18/2016 OT Individual Time: 1300-1340 OT Individual Time Calculation (min): 40 min   Pt denied pain Individual Therapy  OT session initiated after discussion with evaluating OTR and review of POC with OTR. Pt asleep upon arrival but easily aroused.  Pt provided opportunity to don clothing (paper scrubs) X 4 but pt consistently declined dressing.  Pt refused to get OOB but agreed to engaged in simple bed mobility tasks.  Pt also requested to eat her lunch.  Pt required assistance with opening condiment containers to apply on cheeseburger.  Pt only ate 3 bites of her cheeseburger.  Pt ate 100% of her [place].  After eating lunch pt demonstrated difficulty keeping her eyes open.  Question behavioral vs fatigue. Pt remained in bed with all needs within reach and bed alarm activated.  Focus on active participation and engagement in therapeutic activities.   Leotis Shames [facility] 06/18/2016, 1:44 PM

## 2016-06-18 NOTE — Progress Notes (Signed)
Physical Therapy Session Note  Patient Details  Name: Ann Lynch MRN: [id number] Date of Birth: Jul 26, 1960  Today's Date: 06/18/2016 PT Individual Time: 1115-5208 PT Individual Time Calculation (min): 23 min   Skilled Therapeutic Interventions/Progress Updates:  Pt received in bed & agreeable to tx. Pt declined getting dressed this session, but with encouragement was agreeable to getting OOB. Pt transferred sit>supine with min assist, HOB elevated, and bed rails. Pt required mod assist to scoot to EOB. Pt completes stand pivot bed>w/c on R with max assist to lift and shift weight over to chair. Pt also requires cuing and mod assist to scoot R buttock back in w/c seat. Pt propelled w/c room>nurses station with L hemi technique and supervision. Pt requires mod cuing to attend to R for obstacle avoidance. At end of session pt left sitting in w/c in room with call bell within reach.   Therapy Documentation Precautions:  Precautions Precautions: Fall Precaution Comments: right hemiplegia Restrictions Weight Bearing Restrictions: No   See Function Navigator for Current Functional Status.   Therapy/Group: Individual Therapy  Waunita Schooner 06/18/2016, 3:47 PM

## 2016-06-19 ENCOUNTER — Inpatient Hospital Stay (HOSPITAL_COMMUNITY): Payer: Medicare HMO

## 2016-06-19 ENCOUNTER — Inpatient Hospital Stay (HOSPITAL_COMMUNITY): Payer: Medicare HMO | Admitting: Physical Therapy

## 2016-06-19 LAB — PROTIME-INR
INR: 4.92
PROTHROMBIN TIME: 47.3 s — AB (ref 11.4–15.2)

## 2016-06-19 LAB — CBC
HCT: 21.9 % — ABNORMAL LOW (ref 36.0–46.0)
Hemoglobin: 7.3 g/dL — ABNORMAL LOW (ref 12.0–15.0)
MCH: 30.7 pg (ref 26.0–34.0)
MCHC: 33.3 g/dL (ref 30.0–36.0)
MCV: 92 fL (ref 78.0–100.0)
PLATELETS: 320 10*3/uL (ref 150–400)
RBC: 2.38 MIL/uL — AB (ref 3.87–5.11)
RDW: 14.4 % (ref 11.5–15.5)
WBC: 13.2 10*3/uL — ABNORMAL HIGH (ref 4.0–10.5)

## 2016-06-19 LAB — OCCULT BLOOD X 1 CARD TO LAB, STOOL: FECAL OCCULT BLD: POSITIVE — AB

## 2016-06-19 NOTE — Progress Notes (Signed)
[facility] PHYSICAL MEDICINE & REHABILITATION     PROGRESS NOTE    Subjective/Complaints: Complains of pain in legs. Asks when she's going to get to go home. Denies dizziness or sob  ROS: pt denies nausea, vomiting, diarrhea, cough, shortness of breath or chest pain    Objective: Vital Signs: Blood pressure (!) 146/97, pulse 97, temperature 99.8 F (37.7 C), temperature source Oral, resp. rate 18, height 5\' 7"  (1.702 m), weight 80.4 kg (177 lb 3.2 oz), SpO2 99 %. Ct Chest Wo Contrast  Result Date: 06/17/2016 CLINICAL DATA:  Inpatient.  Fever.  Cough.  History of CVA. EXAM: CT CHEST WITHOUT CONTRAST TECHNIQUE: Multidetector CT imaging of the chest was performed following the standard protocol without IV contrast. COMPARISON:  06/15/2016 chest radiograph. FINDINGS: Cardiovascular: Top-normal heart size. No significant pericardial fluid/thickening. Left main coronary atherosclerosis. Minimally atherosclerotic nonaneurysmal thoracic aorta. Normal caliber pulmonary arteries. Mediastinum/Nodes: Hypodense 1.0 cm superior right thyroid lobe nodule. Unremarkable esophagus. No pathologically enlarged axillary, mediastinal or gross hilar lymph nodes, noting limited sensitivity for the detection of hilar adenopathy on this noncontrast study. Lungs/Pleura: No pneumothorax. Small dependent right pleural effusion. Trace dependent left pleural effusion. There is platelike segmental atelectasis in the peripheral basilar left lower lobe. There is subsegmental lingular atelectasis. There is consolidation and volume loss in the dependent right lower lobe. No discrete lung masses or significant pulmonary nodules in the aerated portions of the lungs. Upper abdomen: Unremarkable. Musculoskeletal: No aggressive appearing focal osseous lesions. Mild thoracic spondylosis. IMPRESSION: 1. Small right and trace left dependent pleural effusions . 2. Consolidation and volume loss in the dependent right lower lobe, favor  compressive atelectasis, difficult to exclude a component of pneumonia. 3. Subsegmental lingular and segmental left lower lobe atelectasis . 4. Aortic atherosclerosis.  Left main coronary atherosclerosis. Electronically Signed   By: Ilona Sorrel M.D.   On: 06/17/2016 10:44    Recent Labs  06/18/16 0907 06/19/16 0625  WBC 14.9* 13.2*  HGB 7.7* 7.3*  HCT 23.7* 21.9*  PLT 367 320    Recent Labs  06/18/16 0907  NA 137  K 4.7  CL 103  GLUCOSE 160*  BUN 20  CREATININE 1.59*  CALCIUM 9.0   CBG (last 3)  No results for input(s): GLUCAP in the last 72 hours.  Wt Readings from Last 3 Encounters:  06/17/16 80.4 kg (177 lb 3.2 oz)  06/12/16 77.1 kg (170 lb)    Physical Exam:  Constitutional:  56 year old right-handed female appearing older than stated age Eyes:  Pupils reactive to light Neck: Normal range of motion. Neck supple. No thyromegalypresent.  Cardiovascular: RRR.  Respiratory:  CTA B GI: abd soft NT/ND Skin. Warm and dry Musculoskeletal: She exhibits edema.  Bilateral LE L>R tr edema  persists Neurological: She is alert.  Patient is oriented to person, place and time. She provides her date of birth. Fair awareness of deficits. Dense RUE hemiparesis. RLE 1-2/5 prox to distal with extensor pattern. Dysconjugate gaze. 1-2/4 tone RLE. 3beats clonus right ankle---neuro exam stable Psych: pt cooperative but flat  Assessment/Plan: 1. Functional deficits secondary to debility after bilateral LE DVT's which require 3+ hours per day of interdisciplinary therapy in a comprehensive inpatient rehab setting. Physiatrist is providing close team supervision and 24 hour management of active medical problems listed below. Physiatrist and rehab team continue to assess barriers to discharge/monitor patient progress toward functional and medical goals.  Function:  Bathing Bathing position   Position: Shower  Bathing parts Body parts bathed  by patient: Right arm, Chest,  Abdomen, Front perineal area, Buttocks, Right upper leg, Left upper leg Body parts bathed by helper: Left arm, Right lower leg, Back, Left lower leg  Bathing assist Assist Level:  (MOD A)      Upper Body Dressing/Undressing Upper body dressing   What is the patient wearing?: Hospital gown (pt declines paper clothing)                Upper body assist        Lower Body Dressing/Undressing Lower body dressing   What is the patient wearing?: [place], [place] - Performed by patient: Don/doff left sock Socks - Performed by helper: Don/doff right sock              Lower body assist        Toileting Toileting   Toileting steps completed by patient: Performs perineal hygiene Toileting steps completed by helper: Adjust clothing prior to toileting, Adjust clothing after toileting    Toileting assist Assist level: Two helpers   Transfers Chair/bed transfer   Chair/bed transfer method: Stand pivot Chair/bed transfer assist level: Maximal assist (Pt 25 - 49%/lift and lower)       Locomotion Ambulation           Wheelchair   Type: Manual Max wheelchair distance: 100 ft Assist Level: Supervision or verbal cues  Cognition Comprehension Comprehension assist level: Follows basic conversation/direction with no assist  Expression Expression assist level: Expresses basic needs/ideas: With no assist  Social Interaction Social Interaction assist level: Interacts appropriately 90% of the time - Needs monitoring or encouragement for participation or interaction.  Problem Solving Problem solving assist level: Solves basic 90% of the time/requires cueing < 10% of the time  Memory Memory assist level: Recognizes or recalls 50 - 74% of the time/requires cueing 25 - 49% of the time   Medical Problem List and Plan: 1. Debilitationsecondary to bilateral lower extremity DVTs with history of prior left CVA with right hemiparesis -begin therapies  today 2. DVT Prophylaxis/Anticoagulation: Lovenox/Coumadin.--INR supratherapeutic 4.92 (decr)  -coumadin mgt per pharmacy. Appreciate their help 3. Pain Management: Neurontin 400 mg 3 times a day, Elavil 25 mg daily at bedtime, Cymbalta 20 mg daily Oxycodone as needed q4 prn 4. Mood: Provide emotional support 5. Neuropsych: This patient iscapable of making decisions on herown behalf. 6. Skin/Wound Care: Routine skin checks 7. Fluids/Electrolytes/Nutrition: encourage PO 8.Hypertension. Lopressor 100 mg twice a day,clonidine 0.2 mg twice a day. Monitor with increased mobility 9.Seizure disorder.Dilantin 100 mg TID 10.AKI/hyponatremia.Renal ultrasound negative. Follow-up chemistries. 11.Leukocytosis. Latest WBC 14,000. Chest x-ray atelectasis versus pneumonia. Low-grade fever secondary to pneumonia versus extensive DVTs. Continue Augmentin initiated 06/16/2016 -follow labs and clinically   12. Constipation: resolved -continue with scheduled softener and laxative 13. ABLA: hgb down to 7.3 today---  -no signs of blood loss grossly but remains INR supratherapeuric   -follow serial hgb's  -check stool for OB   LOS (Days) 2 A FACE TO FACE EVALUATION WAS PERFORMED  [name], MD 06/19/2016 8:42 AM

## 2016-06-19 NOTE — Progress Notes (Signed)
Inpatient [facility] Individual Statement of Services  Patient Name:  Ann Lynch  Date:  06/19/2016  Welcome to the [facility].  Our goal is to provide you with an individualized program based on your diagnosis and situation, designed to meet your specific needs.  With this comprehensive rehabilitation program, you will be expected to participate in at least 3 hours of rehabilitation therapies Monday-Friday, with modified therapy programming on the weekends.  Your rehabilitation program will include the following services:  Physical Therapy (PT), Occupational Therapy (OT), 24 hour per day rehabilitation nursing, Case Management (Social Worker), Rehabilitation Medicine, Nutrition Services and Pharmacy Services  Weekly team conferences will be held on Tuesdays to discuss your progress.  Your Social Worker will talk with you frequently to get your input and to update you on team discussions.  Team conferences with you and your family in attendance may also be held.  Expected length of stay:  7 to 12 days  Overall anticipated outcome:  Minimal assistance with maximum assistance for ambulation, toileting  Depending on your progress and recovery, your program may change. Your Social Worker will coordinate services and will keep you informed of any changes. Your Social Worker's name and contact numbers are listed  below.  The following services may also be recommended but are not provided by the [place] will be made to provide these services after discharge if needed.  Arrangements include referral to agencies that provide these services.  Your insurance has been verified to be:  [employer] Your primary doctor is:  Dr. [name]  Pertinent information will be shared with your doctor and your insurance  company.  Social Worker:  Alfonse Alpers, LCSW  [phone] or [phone]  Information discussed with and copy given to patient by: Trey Sailors, 06/19/2016, 10:59 PM

## 2016-06-19 NOTE — Progress Notes (Signed)
Dr. Naaman Plummer notified of critical INR 4.92.  No further orders received.

## 2016-06-19 NOTE — Progress Notes (Signed)
[place] for heparin> lovenox/coumadin Indication: DVT (extensive bilateral DVTs)  No Known Allergies  Patient Measurements: Height: 5\' 7"  (170.2 cm) Weight: 177 lb 3.2 oz (80.4 kg) IBW/kg (Calculated) : 61.6 Heparin Dosing Weight: 77 kg  Vital Signs: Temp: 99.8 F (37.7 C) (06/02 0525) Temp Source: Oral (06/02 0525) BP: 145/91 (06/02 0853) Pulse Rate: 116 (06/02 0853)  Labs:  Recent Labs  06/17/16 0237  06/17/16 1007 06/18/16 0907 06/19/16 0625  HGB  --   < > 7.4* 7.7* 7.3*  HCT  --   --  22.7* 23.7* 21.9*  PLT  --   --  [phone]  LABPROT 31.8*  --   --  54.3* 47.3*  INR 3.00  --   --  5.86* 4.92*  CREATININE  --   --   --  1.59*  --   < > = values in this interval not displayed.   Medical History: Past Medical History:  Diagnosis Date  . Hypertension   . 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 Avoiding Xarelto due to significant interaction with phenytoin.  Phenytoin is a strong CYP3A4 and P-gp inducer and should not be used with DOACs (although there is little data with edoxaban).  Plans for anticoagulation are lovenox transition to coumadin with minimum of 5 day overlap.   Warfarin/enoxaparin on hold yesterday 2/2 sharp rise in INR, now down slightly, but still close to 5.  Drug interactions noted with Phenytoin and Augmentin therapy likely contributing to elevated INR as well as liver function. Will stop augmentin per discussion with MD. May need small dose of vitamin K tomorrow if INR still >4. LFTs are mildly elevated, trending down. 102/44 > 77/36, PO Intake inconsistent. SCr 1.59, estimated CrCl ~45 mL/min. Hgb 7.4 (low stable, but down from >8 on 5/29), pltc 367K. No bleeding note per RN.    Goal of Therapy:  INR 2-3 Anti-Xa level 0.6-1 units/ml 4hrs after LMWH dose given Monitor platelets by anticoagulation protocol: Yes   Plan:  -Hold warfarin/enoxaparin again today -Daily PT/INR -D/c augmentin -Pt will need minimum 5 day lovenox/coumadin overlap, therefore will consider restart lovenox tomorrow if INR is trending down generously.  -Consider holding statin therapy with rising LFTs   Dierdre Harness, Cain Sieve, PharmD Clinical Pharmacy Resident [phone] (Pager) 06/19/2016 9:21 AM

## 2016-06-19 NOTE — Progress Notes (Signed)
Occupational Therapy Session Note  Patient Details  Name: Ann Lynch MRN: [id number] Date of Birth: March 05, 1960  Today's Date: 06/19/2016 OT Individual Time: 1420-1445 OT Individual Time Calculation (min): 25 min    Short Term Goals: Week 1:  OT Short Term Goal 1 (Week 1): Pt will maintain static standing balance with MIN A in prep for clothing management OT Short Term Goal 2 (Week 1): Pt will don footwear with supervision OT Short Term Goal 3 (Week 1): Pt will perform LB bathing with min a and AE prn OT Short Term Goal 4 (Week 1): Pt will stand at sink for 2 grooming items to improve funcitonal endurance  Skilled Therapeutic Interventions/Progress Updates:    1:1. Pt reporting feeling nauseaus, no pain, but aggreable to tx. Pt stand pivot transfer EOB<>w/c with MOD A for lifting VC for sequencing, and safety awareness. Pt brushes teeth in sitting with superivision. Pt dons pull over shirt with supervision and VC for hemi technique. Pt attempts to thread LLE into pants however says bending makes nausea worse. OT provides total A to don pants. Pt sit to stand at sink with MOD A for lifting and Vc for hand placement on sink, weight shifting and hip extension as OT advances pants past hips. Exited session with pt seated in w/c with call light in reach and all needs met.   Therapy Documentation Precautions:  Precautions Precautions: Fall Precaution Comments: right hemiplegia Restrictions Weight Bearing Restrictions: No  See Function Navigator for Current Functional Status.   Therapy/Group: Individual Therapy  Tonny Branch 06/19/2016, 4:01 PM

## 2016-06-20 ENCOUNTER — Inpatient Hospital Stay (HOSPITAL_COMMUNITY): Payer: Medicare HMO

## 2016-06-20 ENCOUNTER — Inpatient Hospital Stay (HOSPITAL_COMMUNITY): Payer: Medicare HMO | Admitting: Physical Therapy

## 2016-06-20 DIAGNOSIS — I10 Essential (primary) hypertension: Secondary | ICD-10-CM

## 2016-06-20 DIAGNOSIS — I82413 Acute embolism and thrombosis of femoral vein, bilateral: Secondary | ICD-10-CM

## 2016-06-20 DIAGNOSIS — D62 Acute posthemorrhagic anemia: Secondary | ICD-10-CM

## 2016-06-20 LAB — CBC
HEMATOCRIT: 22.4 % — AB (ref 36.0–46.0)
HEMOGLOBIN: 7.3 g/dL — AB (ref 12.0–15.0)
MCH: 29.6 pg (ref 26.0–34.0)
MCHC: 32.6 g/dL (ref 30.0–36.0)
MCV: 90.7 fL (ref 78.0–100.0)
Platelets: 353 10*3/uL (ref 150–400)
RBC: 2.47 MIL/uL — AB (ref 3.87–5.11)
RDW: 14.2 % (ref 11.5–15.5)
WBC: 13.3 10*3/uL — AB (ref 4.0–10.5)

## 2016-06-20 LAB — GLUCOSE, CAPILLARY: Glucose-Capillary: 111 mg/dL — ABNORMAL HIGH (ref 65–99)

## 2016-06-20 LAB — PROTIME-INR
INR: 2.44
Prothrombin Time: 26.9 seconds — ABNORMAL HIGH (ref 11.4–15.2)

## 2016-06-20 MED ORDER — CLONIDINE HCL 0.2 MG PO TABS
0.2000 mg | ORAL_TABLET | Freq: Three times a day (TID) | ORAL | Status: DC
  Administered 2016-06-20 – 2016-06-27 (×22): 0.2 mg via ORAL
  Filled 2016-06-20 (×22): qty 1

## 2016-06-20 MED ORDER — WARFARIN SODIUM 4 MG PO TABS
4.0000 mg | ORAL_TABLET | Freq: Once | ORAL | Status: AC
  Administered 2016-06-20: 4 mg via ORAL
  Filled 2016-06-20: qty 1

## 2016-06-20 NOTE — IPOC Note (Signed)
Overall Plan of Care [facility]) Patient Details Name: KENYIA WAMBOLT MRN: [id number] DOB: 07-Sep-1960  Admitting Diagnosis: L CVA and Debility  Hospital Problems: Principal Problem:   Debilitated Active Problems:   CVA, old, hemiparesis ([place])   DVT of lower extremity, bilateral ([place])     Functional Problem List: Nursing Edema, Endurance, Medication Management, Pain, Safety  PT Balance, Pain, Motor, Endurance  OT Balance, Cognition, Endurance, Motor, Pain, Safety, Vision  SLP    TR         Basic ADL's: OT Grooming, Bathing, Dressing, Toileting     Advanced  ADL's: OT       Transfers: PT Bed Mobility, Bed to Chair, Car, Manufacturing systems engineer, Metallurgist: PT Emergency planning/management officer, Ambulation     Additional Impairments: OT    SLP        TR      Anticipated Outcomes Item Anticipated Outcome  Self Feeding MOD I  Swallowing      Basic self-care  MIN - MOD A  Toileting  MAX A toileting, MIN A shower   Bathroom Transfers MIN A  Bowel/Bladder  n/a  Transfers  min A to S for bed/chair<>w/c, car transfers  Locomotion  S w/c propulsion; max A gait in controlled environment   Communication     Cognition     Pain  3 or less out of 10  Safety/Judgment  min assist   Therapy Plan: PT Intensity: Minimum of 1-2 x/day ,45 to 90 minutes PT Frequency: 5 out of 7 days PT Duration Estimated Length of Stay: 8-12 days OT Intensity: Minimum of 1-2 x/day, 45 to 90 minutes OT Frequency: 5 out of 7 days OT Duration/Estimated Length of Stay: 8-12         Team Interventions: Nursing Interventions Patient/Family Education, Pain Management, Disease Management/Prevention, Medication Management  PT interventions Ambulation/gait training, Training and development officer, Cognitive remediation/compensation, Community reintegration, Discharge planning, Disease management/prevention, DME/adaptive equipment instruction, Neuromuscular re-education, Functional mobility  training, Functional electrical stimulation, Pain management, Patient/family education, Skin care/wound management, Therapeutic Exercise, Therapeutic Activities, UE/LE Strength taining/ROM, UE/LE Coordination activities, Wheelchair propulsion/positioning, Splinting/orthotics, Visual/perceptual remediation/compensation  OT Interventions Training and development officer, Cognitive remediation/compensation, Discharge planning, Disease mangement/prevention, DME/adaptive equipment instruction, Functional mobility training, Pain management, Patient/family education, Psychosocial support, Self Care/advanced ADL retraining, Therapeutic Activities, Therapeutic Exercise, UE/LE Strength taining/ROM, UE/LE Coordination activities, Wheelchair propulsion/positioning  SLP Interventions    TR Interventions    SW/CM Interventions Discharge Planning, Psychosocial Support, Patient/Family Education    Team Discharge Planning: Destination: PT-Home ,OT- Home , SLP-  Projected Follow-up: PT- , OT-  Home health OT, SLP-  Projected Equipment Needs: PT- , OT- To be determined, SLP-  Equipment Details: PT- , OT-  Patient/family involved in discharge planning: PT- Patient,  OT-Patient, SLP-   MD ELOS: 8-12 days Medical Rehab Prognosis:  Excellent Assessment: The patient has been admitted for CIR therapies with the diagnosis of debility related to bilateral LE DVT's in a pt with chronic right spastic hemiparesis. The team will be addressing functional mobility, strength, stamina, balance, safety, adaptive techniques and equipment, self-care, bowel and bladder mgt, patient and caregiver education, NMR, pain control, activity tolerance, w/c assessment, community reintegration. Goals have been set at supervision to min assist with transfers and w/c mobility and min to max assist with self-care tasks and ADL's. [name], MD, FAAPMR      See Team Conference Notes for weekly updates to the plan of care

## 2016-06-20 NOTE — Progress Notes (Signed)
Physical Therapy Session Note  Patient Details  Name: Ann Lynch MRN: [id number] Date of Birth: [birth year]  Today's Date: 06/20/2016 PT Individual Time: 0800-0900 PT Individual Time Calculation (min): 60 min   Short Term Goals: Week 1:  PT Short Term Goal 1 (Week 1): Pt will perform sit>stand with mod A.  PT Short Term Goal 2 (Week 1): Pt will perform bed mobility with min A.  PT Short Term Goal 3 (Week 1): Pt will propel manual w/c 150' with min A.  PT Short Term Goal 4 (Week 1): Pt will transfer bed<>w/c with mod A.  PT Short Term Goal 5 (Week 1): Pt will initiate gait training with max A.   Skilled Therapeutic Interventions/Progress Updates: Pt received semi-reclined in bed, asleep but easily awoken. C/o pain 5/10 in LLE and requesting pain medication at end of session; RN alerted. Supine>sit minA with HOB elevated. Stand pivot transfer modA to w/c. Questioned pt if brief was clean and pt was very offended stating "what are you trying to say? Just because you got new shoes doesn't mean you're something special"; assured pt that therapist was not being condescending and just wanted to ensure pt was clean and comfortable, and pt was easily redirected. Transported to/from gym totalA with pt declining to self-propel. Stand pivot transfer w/c <>mat to L side with modA. Sitting balance on edge of mat with wedge under R hip while performing dynamic reaching activities with LUE towards R side to facilitate R trunk shortening and midline postural alignment. Card matching task requires increased time and cues for visual scanning especially to R side; question R inattention. Sit <>stand x5 reps with HW and modA. Mirror used for visual feedback to improve trunk/head alignment. Returned to w/c modA as above, to room totalA. Obtained R lap tray to support hemiparetic arm, towel wedge under R hip for trunk alignment. Remained seated in w/c at end of session, all needs in reach.      Therapy  Documentation Precautions:  Precautions Precautions: Fall Precaution Comments: right hemiplegia Restrictions Weight Bearing Restrictions: No   See Function Navigator for Current Functional Status.   Therapy/Group: Individual Therapy  Luberta Mutter 06/20/2016, 9:01 AM

## 2016-06-20 NOTE — Progress Notes (Signed)
Occupational Therapy Session Note  Patient Details  Name: Ann Lynch MRN: [id number] Date of Birth: 26-Jun-1960  Today's Date: 06/20/2016 OT Individual Time: 9381-8299 OT Individual Time Calculation (min): 60 min    Short Term Goals: Week 1:  OT Short Term Goal 1 (Week 1): Pt will maintain static standing balance with MIN A in prep for clothing management OT Short Term Goal 2 (Week 1): Pt will don footwear with supervision OT Short Term Goal 3 (Week 1): Pt will perform LB bathing with min a and AE prn OT Short Term Goal 4 (Week 1): Pt will stand at sink for 2 grooming items to improve funcitonal endurance  Skilled Therapeutic Interventions/Progress Updates:    1:1. Pt reporting pain in LLE, however does not think it is time for medication. Focus of session on LB dressing, sit to stand, chair transfers and posture. Pt dons pull up pants with VC for hemi dressing and A to advance pants over hips in standing. In gym pt completes 10 sit to stands with overall MOD A for lifting, R knee blocking, and verbal and visual cues for hip extension with mirror. 3/10 stands at parallel bars with MIN A to stand as pt able to pull self up to standing. Pt transfers EOB<>w/c 3 times with MIN A to transfer to L and MOD A to transfer to R for lifting and pivoting R foot. While seated EOM, OT pushes physioball into spine to assist with stretching chest open and creating more natural thoracic/lumbar posture 3x2 min hold. Exited session with pt seated in w/c with call light in reach and all needs met.   Therapy Documentation Precautions:  Precautions Precautions: Fall Precaution Comments: right hemiplegia Restrictions Weight Bearing Restrictions: No  See Function Navigator for Current Functional Status.   Therapy/Group: Individual Therapy  Tonny Branch 06/20/2016, 5:46 PM

## 2016-06-20 NOTE — Progress Notes (Addendum)
Social Work Assessment and Plan  Patient Details  Name: Ann Lynch MRN: [id number] Date of Birth: 07-20-1960  Today's Date: 06/18/2016  Problem List:  Patient Active Problem List   Diagnosis Date Noted  . Debilitated 06/17/2016  . DVT of lower extremity, bilateral ([place]) 06/17/2016  . Sepsis due to cellulitis ([place]) 06/11/2016  . Aphasia 06/08/2013  . Slurred speech 06/08/2013  . AKI (acute kidney injury) ([place]) 06/08/2013  . CVA, old, hemiparesis ([place]) 06/08/2013  . HTN (hypertension) 06/08/2013   Past Medical History:  Past Medical History:  Diagnosis Date  . Hypertension   . Stroke [facility])    Past Surgical History:  Past Surgical History:  Procedure Laterality Date  . TRACHEOSTOMY     Social History:  reports that she has never smoked. She has never used smokeless tobacco. She reports that she does not drink alcohol. Her drug history is not on file.  Family / Support Systems Marital Status: Single Patient Roles: Parent, Other (Comment) (dtr; sister) Children: 3 children in [place] Other Supports: Doretha Sou - mother/guardian - [phone] Anticipated Caregiver: Mom and sister, Olivia Mackie Ability/Limitations of Caregiver: Mom can provide upt to min assist, per [facility], CSW to confirm. Caregiver Availability: 24/7 Family Dynamics: Pt reports good family support.  Social History Preferred language: English Religion:  Read: Yes Write: Yes Employment Status: Disabled Public relations account executive Issues: none reported Guardian/Conservator: Chart shows pt's mother is her guardian - CSW to confirm.   Abuse/Neglect Physical Abuse: Denies Verbal Abuse: Denies Sexual Abuse: Denies Exploitation of patient/patient's resources: Denies Self-Neglect: Denies  Emotional Status Pt's affect, behavior and adjustment status: Pt was very tired during CSW visit, so it was hard to assess this for pt.  Will continue to assess. Recent Psychosocial Issues: Pt reisded in NH in [place] for  years. Psychiatric History: none reported Substance Abuse History: none reported  Patient / Family Perceptions, Expectations & Goals Pt/Family understanding of illness & functional limitations: Pt was not able to voice her understanding of her condition/illness due to extreme fatigue and sleepiness. Premorbid pt/family roles/activities: Pt not able to talk about this either. Anticipated changes in roles/activities/participation: Continuing to assess. Pt/family expectations/goals: CSW to ask pt this when she is more alert and can create a goal.  [employer]: None Premorbid Home Care/DME Agencies: None Transportation available at discharge: family Resource referrals recommended: Neuropsychology, Support group (specify) (Stroke support group)  Discharge Planning Living Arrangements: Parent, Other relatives (sister) Support Systems: Children, Parent, Other relatives Type of Residence: Private residence Insurance Resources: Medicare (Government social research officer) Financial Resources: SSD Financial Screen Referred: No Living Expenses: Lives with family Money Management: Family Does the patient have any problems obtaining your medications?: No Home Management: Pt's family taking care of home. Patient/Family Preliminary Plans: Pt's mother reportedly can manage pt at min assist.  CSW to confirm this with her. Barriers to Discharge:  (ramped entrance) Social Work Anticipated Follow Up Needs: HH/OP, Support Group Expected length of stay: 7 to 12 days  Clinical Impression CSW met with pt to introduce self and role of CSW, as well as to complete assessment.  Pt was tired and kept falling asleep during CSW visit.  Pt tried to stay awake and talk with CSW, but couldn't for very long.  CSW explained that CSW will come back to visit pt and left calling card.  CSW did chart review and will confirm pt's plans for d/c with pt and her mother.  CSW will continue to follow and assist as  needed.  Jamiyah Dingley, Silvestre Mesi 06/19/2016, 1:15 AM

## 2016-06-20 NOTE — Progress Notes (Addendum)
BP this AM=142/102, HR=102. Patient diaphoretic, denied CP, SOB or any other complaint of. "I feel fine. Why am I sweaty?" Spot check of  CBG=111. Paged Dr. Naaman Plummer, with orders to give 0800, clonidine and lopressor early. Incontinent of urine X 2 during night, requiring total assist with clean up and hygiene. Ann Lynch A

## 2016-06-20 NOTE — Progress Notes (Signed)
[facility] PHYSICAL MEDICINE & REHABILITATION     PROGRESS NOTE    Subjective/Complaints: RN contacted me early this morning about increased BP/HR. Patient states she feels fine. Pain a little better but still present in left thigh  ROS: pt denies nausea, vomiting, diarrhea, cough, shortness of breath or chest pain    Objective: Vital Signs: Blood pressure (!) 142/102, pulse (!) 102, temperature 99.2 F (37.3 C), temperature source Oral, resp. rate 16, height 5\' 7"  (1.702 m), weight 80.4 kg (177 lb 3.2 oz), SpO2 98 %. No results found.  Recent Labs  06/19/16 0625 06/20/16 0659  WBC 13.2* 13.3*  HGB 7.3* 7.3*  HCT 21.9* 22.4*  PLT 320 353    Recent Labs  06/18/16 0907  NA 137  K 4.7  CL 103  GLUCOSE 160*  BUN 20  CREATININE 1.59*  CALCIUM 9.0   CBG (last 3)   Recent Labs  06/20/16 0507  GLUCAP 111*    Wt Readings from Last 3 Encounters:  06/17/16 80.4 kg (177 lb 3.2 oz)  06/12/16 77.1 kg (170 lb)    Physical Exam:  Constitutional:  No acute distress Eyes:  Pupils reactive to light Neck: Normal range of motion. Neck supple. No thyromegalypresent.  Cardiovascular: tachycardic  Respiratory:  CTA B GI: abd soft NT/ND Skin. Warm and dry Musculoskeletal: She exhibits edema.  Bilateral LE L>R tr edema  Persists, left thigh sl tender Neurological: She is alert.  Patient is oriented to person, place and time. She provides her date of birth. Fair awareness of deficits. Dense RUE hemiparesis. RLE 1-2/5 prox to distal with extensor pattern. Dysconjugate gaze. 1-2/4 tone RUE/RLE. 3beats clonus right ankle---neuro exam stable Psych: pt cooperative but flat  Assessment/Plan: 1. Functional deficits secondary to debility after bilateral LE DVT's which require 3+ hours per day of interdisciplinary therapy in a comprehensive inpatient rehab setting. Physiatrist is providing close team supervision and 24 hour management of active medical problems listed  below. Physiatrist and rehab team continue to assess barriers to discharge/monitor patient progress toward functional and medical goals.  Function:  Bathing Bathing position   Position: Shower  Bathing parts Body parts bathed by patient: Right arm, Chest, Abdomen, Front perineal area, Buttocks, Right upper leg, Left upper leg Body parts bathed by helper: Left arm, Right lower leg, Back, Left lower leg  Bathing assist Assist Level:  (MOD A)      Upper Body Dressing/Undressing Upper body dressing   What is the patient wearing?: Hospital gown (pt declines paper clothing)                Upper body assist        Lower Body Dressing/Undressing Lower body dressing   What is the patient wearing?: Hospital Gown, [place] by patient: Don/doff left sock Socks - Performed by helper: Don/doff right sock              Lower body assist        Toileting Toileting Toileting activity did not occur: No continent bowel/bladder event Toileting steps completed by patient: Performs perineal hygiene Toileting steps completed by helper: Adjust clothing prior to toileting, Adjust clothing after toileting    Toileting assist Assist level: Two helpers   Transfers Chair/bed transfer   Chair/bed transfer method: Stand pivot Chair/bed transfer assist level: Maximal assist (Pt 25 - 49%/lift and lower)       Locomotion Ambulation  Wheelchair   Type: Manual Max wheelchair distance: 100 ft Assist Level: Supervision or verbal cues  Cognition Comprehension Comprehension assist level: Follows basic conversation/direction with no assist  Expression Expression assist level: Expresses basic needs/ideas: With extra time/assistive device  Social Interaction Social Interaction assist level: Interacts appropriately 90% of the time - Needs monitoring or encouragement for participation or interaction.  Problem Solving Problem solving assist level: Solves  basic 90% of the time/requires cueing < 10% of the time  Memory Memory assist level: Recognizes or recalls 50 - 74% of the time/requires cueing 25 - 49% of the time   Medical Problem List and Plan: 1. Debilitationsecondary to bilateral lower extremity DVTs with history of prior left CVA with right hemiparesis -continue therapies 2. DVT Prophylaxis/Anticoagulation: Lovenox/Coumadin.--INR down to 2.44 today  -coumadin mgt per pharmacy. Appreciate their help 3. Pain Management: Neurontin 400 mg 3 times a day, Elavil 25 mg daily at bedtime, Cymbalta 20 mg daily Oxycodone as needed q4 prn 4. Mood: Provide emotional support 5. Neuropsych: This patient iscapable of making decisions on herown behalf. 6. Skin/Wound Care: Routine skin checks 7. Fluids/Electrolytes/Nutrition: encourage PO 8.Hypertension. Lopressor 100 mg twice a day,clonidine 0.2 mg twice a day. Monitor with increased mobility 9.Seizure disorder.Dilantin 100 mg TID 10.AKI/hyponatremia.Renal ultrasound negative. Follow-up chemistries. 11.Leukocytosis. Latest WBC 13k. Chest x-ray atelectasis versus pneumonia.  -stopped augmentin given potential effect on INR -follow labs and clinically   12. Constipation: resolved -continue with scheduled softener and laxative 13. ABLA: hgb holding at 7.3 today---  -no signs of blood loss grossly but remains INR supratherapeuric   -follow serial hgb's  -stool OB +, check again with next stool  -no gross blood loss  14. HTN: increase today, has been borderline high already---increase catapres to tid   LOS (Days) 3 A FACE TO FACE EVALUATION WAS PERFORMED  [name], MD 06/20/2016 8:40 AM

## 2016-06-20 NOTE — Progress Notes (Signed)
[place] for coumadin Indication: DVT (extensive bilateral DVTs)  No Known Allergies  Patient Measurements: Height: 5\' 7"  (170.2 cm) Weight: 177 lb 3.2 oz (80.4 kg) IBW/kg (Calculated) : 61.6 Heparin Dosing Weight: 77 kg  Vital Signs: Temp: 99.2 F (37.3 C) (06/03 0400) Temp Source: Oral (06/03 0400) BP: 142/102 (06/03 0400) Pulse Rate: 102 (06/03 0400)  Labs:  Recent Labs  06/18/16 0907 06/19/16 0625 06/20/16 0659  HGB 7.7* 7.3* 7.3*  HCT 23.7* 21.9* 22.4*  PLT 367 320 353  LABPROT 54.3* 47.3* 26.9*  INR 5.86* 4.92* 2.44  CREATININE 1.59*  --   --      Medical History: Past Medical History:  Diagnosis Date  . Hypertension   . 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 Converted from heparin to warfarin/lovenox bridge. Had two days of overlap with warfarin and enoxaparin, then INR increased and warfarin held while enoxaparin continued for two more days. Both held for another two days because INR was still>4. Now INR 2.44. Of note, augmentin was discontinued yesterday. CBC is stable and no bleeding noted. Will give dose tonight to avoid continued fall in INR.  LFTs are mildly elevated, trending down. 102/44 > 77/36, PO Intake inconsistent. SCr 1.59, estimated CrCl ~45 mL/min.   Goal of Therapy:  INR 2-3 Anti-Xa level 0.6-1 units/ml 4hrs after LMWH dose given Monitor platelets by anticoagulation protocol: Yes   Plan:  -warfarin 4mg  po x1 tonight -Daily PT/INR -monitor s/sx of bleeding -Consider holding statin therapy with rising LFTs   Dierdre Harness, BS, PharmD Clinical Pharmacy Resident [phone] (Pager) 06/20/2016 9:51 AM

## 2016-06-20 NOTE — Progress Notes (Signed)
Occupational Therapy Session Note  Patient Details  Name: Ann Lynch MRN: [id number] Date of Birth: 01/09/1961  Today's Date: 06/20/2016 OT Individual Time: 9747-1855 OT Individual Time Calculation (min): 71 min    Short Term Goals: Week 1:  OT Short Term Goal 1 (Week 1): Pt will maintain static standing balance with MIN A in prep for clothing management OT Short Term Goal 2 (Week 1): Pt will don footwear with supervision OT Short Term Goal 3 (Week 1): Pt will perform LB bathing with min a and AE prn OT Short Term Goal 4 (Week 1): Pt will stand at sink for 2 grooming items to improve funcitonal endurance  Skilled Therapeutic Interventions/Progress Updates:    1:1. Pt reports urgency to void bladder. Pt transfers with MOD A for lifting w/c<>BSC with VC for sequencing and safety awareness. Pt req A for clothing management. Pt transfer w/c<>TTB with MOD A using grab bar in roll in shower. OT instructs on use of LHSS to wash BLE. Pt bathes seated leaning laterally to wash buttocks with A to wash back and R arm. Pt dons hospital gown and non slip socks with supervision crossing ankles to don R sock. Pt completes sit to stand at sink with MOD A and VC for hand placement as OT fastens brief. Pt grooms at sink with set up and question cueing for recalling one handed technique for applying toothpaste. Exited session with pt seated in w/c with call light in reach and all needs met.   Therapy Documentation Precautions:  Precautions Precautions: Fall Precaution Comments: right hemiplegia Restrictions Weight Bearing Restrictions: No  See Function Navigator for Current Functional Status.   Therapy/Group: Individual Therapy  Tonny Branch 06/20/2016, 10:36 AM

## 2016-06-21 ENCOUNTER — Inpatient Hospital Stay (HOSPITAL_COMMUNITY): Payer: Medicare HMO | Admitting: Physical Therapy

## 2016-06-21 ENCOUNTER — Inpatient Hospital Stay (HOSPITAL_COMMUNITY): Payer: Medicare HMO | Admitting: Occupational Therapy

## 2016-06-21 LAB — PREPARE RBC (CROSSMATCH)

## 2016-06-21 LAB — CBC
HCT: 21.6 % — ABNORMAL LOW (ref 36.0–46.0)
Hemoglobin: 6.9 g/dL — CL (ref 12.0–15.0)
MCH: 29 pg (ref 26.0–34.0)
MCHC: 31.9 g/dL (ref 30.0–36.0)
MCV: 90.8 fL (ref 78.0–100.0)
PLATELETS: 334 10*3/uL (ref 150–400)
RBC: 2.38 MIL/uL — AB (ref 3.87–5.11)
RDW: 14.3 % (ref 11.5–15.5)
WBC: 11.6 10*3/uL — AB (ref 4.0–10.5)

## 2016-06-21 LAB — OCCULT BLOOD X 1 CARD TO LAB, STOOL: FECAL OCCULT BLD: NEGATIVE

## 2016-06-21 LAB — PROTIME-INR
INR: 2.77
Prothrombin Time: 29.8 seconds — ABNORMAL HIGH (ref 11.4–15.2)

## 2016-06-21 LAB — PHENYTOIN LEVEL, FREE AND TOTAL
PHENYTOIN FREE: 0.9 ug/mL — AB (ref 1.0–2.0)
PHENYTOIN, TOTAL: 4.1 ug/mL — AB (ref 10.0–20.0)

## 2016-06-21 LAB — ABO/RH: ABO/RH(D): O POS

## 2016-06-21 MED ORDER — WARFARIN SODIUM 2 MG PO TABS
2.0000 mg | ORAL_TABLET | Freq: Once | ORAL | Status: AC
  Administered 2016-06-21: 2 mg via ORAL
  Filled 2016-06-21: qty 1

## 2016-06-21 MED ORDER — SODIUM CHLORIDE 0.9 % IV SOLN
Freq: Once | INTRAVENOUS | Status: AC
  Administered 2016-06-21: 1 mL via INTRAVENOUS

## 2016-06-21 MED ORDER — ACETAMINOPHEN 325 MG PO TABS
650.0000 mg | ORAL_TABLET | Freq: Once | ORAL | Status: AC
  Administered 2016-06-21: 650 mg via ORAL
  Filled 2016-06-21: qty 2

## 2016-06-21 MED ORDER — DIPHENHYDRAMINE HCL 25 MG PO CAPS
25.0000 mg | ORAL_CAPSULE | Freq: Once | ORAL | Status: AC
  Administered 2016-06-21: 25 mg via ORAL
  Filled 2016-06-21: qty 1

## 2016-06-21 MED ORDER — PANTOPRAZOLE SODIUM 40 MG PO TBEC
40.0000 mg | DELAYED_RELEASE_TABLET | Freq: Two times a day (BID) | ORAL | Status: DC
  Administered 2016-06-21 – 2016-06-27 (×13): 40 mg via ORAL
  Filled 2016-06-21 (×13): qty 1

## 2016-06-21 MED ORDER — FUROSEMIDE 10 MG/ML IJ SOLN
20.0000 mg | Freq: Once | INTRAMUSCULAR | Status: AC
  Administered 2016-06-21: 20 mg via INTRAVENOUS
  Filled 2016-06-21: qty 2

## 2016-06-21 NOTE — Progress Notes (Signed)
Physical Therapy Session Note  Patient Details  Name: Ann Lynch MRN: [id number] Date of Birth: 1960-10-27  Today's Date: 06/21/2016 PT Individual Time: 4098-1191 PT Individual Time Calculation (min): 70 min   Short Term Goals: Week 1:  PT Short Term Goal 1 (Week 1): Pt will perform sit>stand with mod A.  PT Short Term Goal 2 (Week 1): Pt will perform bed mobility with min A.  PT Short Term Goal 3 (Week 1): Pt will propel manual w/c 150' with min A.  PT Short Term Goal 4 (Week 1): Pt will transfer bed<>w/c with mod A.  PT Short Term Goal 5 (Week 1): Pt will initiate gait training with max A.   Skilled Therapeutic Interventions/Progress Updates:  Pt received in bed & agreeable to tx. Session focused on transfers, w/c mobility, & sitting balance. Pt transferred supine>sitting EOB with min assist to transfer RLE to EOB, bed rails, and HOB elevated. Pt completed squat pivot transfers bed>w/c and w/c<>mat table with mod assist overall to L & R. Pt requires manual facilitation for weight shifting to complete pivot portion of transfer. In room therapist set pt up at sink and pt performed teeth brushing and washed her face with more than reasonable amount of time. Pt propelled w/c room>gym with L hemi technique and min assist for steering to avoid obstacles on R. Pt with decreased steering with LLE despite cuing from therapist. In gym pt sat on mat table and reached outside of BOS in all directions with LUE to obtain then toss horseshoes. Multiple times pt reported fear of falling with therapist reassuring her of safety. Pt reports she doesn't do anything at home except watch TV, in order to prevent falls. Pt without any LOB during task but reports it's challenging. Pt completed car transfer at sedan simulated height with mod assist and max cuing for hand placement and sequencing. Pt reports PTA she was able to get into the car without any assistance, only someone stabilizing the w/c. Pt returned to  room via w/c total assist and reported need to use restroom. Pt transferred w/c>toilet with grab bar and mod assist for pivot. Pt with incontinent void but reports need to have BM. Pt left on toilet with RN in room.    Therapy Documentation Precautions:  Precautions Precautions: Fall Precaution Comments: right hemiplegia Restrictions Weight Bearing Restrictions: No  Vital Signs: HR = 117 bpm after engaging in seated balance activities SpO2 = 100% on room air  Pain: 0/10 at rest that increased to 8/10 L thigh (throbbing) during session - RN made aware & pain meds requested.   See Function Navigator for Current Functional Status.   Therapy/Group: Individual Therapy  Waunita Schooner 06/21/2016, 12:52 PM

## 2016-06-21 NOTE — Progress Notes (Signed)
Occupational Therapy Session Note  Patient Details  Name: Ann Lynch MRN: [id number] Date of Birth: [birth year]  Today's Date: 06/21/2016 OT Individual Time: 1005-1101 1333-1450 OT Individual Time Calculation (min): 56 min and 77 min  Short Term Goals: Week 1:  OT Short Term Goal 1 (Week 1): Pt will maintain static standing balance with MIN A in prep for clothing management OT Short Term Goal 2 (Week 1): Pt will don footwear with supervision OT Short Term Goal 3 (Week 1): Pt will perform LB bathing with min a and AE prn OT Short Term Goal 4 (Week 1): Pt will stand at sink for 2 grooming items to improve funcitonal endurance  Skilled Therapeutic Interventions/Progress Updates:    Tx focus on functional transfers, standing balance and adaptive dressing skills.   Pt greeted supine in bed, RN administering IV. Pt requiring max encouragement to participate in tx. Educated on her on therapy goals to improve motivation. Pt very motivated to return home to dog [place]. She transitioned to EOB with Mod A for guiding R LE. Pt completed stand pivot transfer<w/c<toilet with Mod A and manual facilitation of R LE. Pt requiring Mod A for toileting and assist with standing balance. Pt then completed UB/LB dressing of paper clothing w/c level at sink. Assist required for proper placement of R LE during all sit<stand transitions. Pt provided education on hemi techniques though was adamant regarding continuing with her own techniques. She was able to complete UB dressing with supervision, required Max A for LB dressing with extra time and trials of adaptive techniques. Pt could not assume figure 4 position. Pt was then transferred back to bed for blood transfusion. Pt left with RN and all needs within reach at time of departure. She refused to be positioned in bed to protect hemiplegic side.    2nd Session 1:1 tx (77 min) Tx focus on sitting balance/postural awareness, visual scanning, problem solving, and  endurance during leisure occupation.   Pt greeted supine in bed receiving blood transfusion. She was agreeable to tx in room. Pt participated in puzzle completion at EOB. Modified wedge placed under right hip with mirror provided for visual biofeedback to increase postural awareness. Pt requiring mod cues to attend to posture, though did acknowledge that her head "was crooked" when mirror was first introduced and improved her cervical alignment temporarily. Pt provided max cues for attending to pieces on Rt side. Max cues provided for organization, orientation, and problem solving throughout. With use of therapy ipad, pt singing to songs from [employer] [place] to enhance affect and engagement. Took her 1 hour to complete the 24 piece puzzle. Afterwards, OT observed that sheets were becoming damp and that pt had been incontinent. Pt transferred to w/c with Mod A. Educated her to notify OT in future sessions for bathroom needs with verbalized understanding. All needs were placed within reach. NT and RN made aware of pts position and situation to assist with cleanup.   Therapy Documentation Precautions:  Precautions Precautions: Fall Precaution Comments: right hemiplegia Restrictions Weight Bearing Restrictions: No   Vital Signs: Therapy Vitals Temp: 98.9 F (37.2 C) Temp Source: Oral Pulse Rate: (!) 109 Resp: 20 BP: 134/86 Oxygen Therapy SpO2: 98 % O2 Device: Not Delivered Pain: No c/o pain during tx  Pain Assessment Pain Score: 7  ADL:  :    See Function Navigator for Current Functional Status.   Therapy/Group: Individual Therapy  Lyndol Vanderheiden A Niesha Bame 06/21/2016, 12:44 PM

## 2016-06-21 NOTE — Progress Notes (Signed)
Social Work Patient ID: Ann Lynch, female   DOB: 1960-07-11, 56 y.o.   MRN: [id number]   CSW spoke with pt's mother who stated that pt would need new DME, that her hospital bed and [facility] were old and in poor condition.  She is also requesting a transport chair to take pt to MD appts.  CSW explained we will conference pt tomorrow and discuss DME and targeted d/c date and CSW will be in touch afterwards.  Pt's mother was appreciative.  CSW will continue to follow and assist as needed.

## 2016-06-21 NOTE — Progress Notes (Signed)
Incontinent of urine X 2 during night. Up to [facility], continent BM mixed with urine. Patrici Ranks A

## 2016-06-21 NOTE — Progress Notes (Signed)
PHYSICAL MEDICINE & REHABILITATION     PROGRESS NOTE    Subjective/Complaints: Pt in bed. Feels fairly well. Left thigh pain fairly persistent  ROS: pt denies nausea, vomiting, diarrhea, cough, shortness of breath or chest pain     Objective: Vital Signs: Blood pressure 117/74, pulse (!) 102, temperature 98.9 F (37.2 C), temperature source Oral, resp. rate 18, height 5\' 7"  (1.702 m), weight 80.4 kg (177 lb 3.2 oz), SpO2 97 %. No results found.  Recent Labs  06/20/16 0659 06/21/16 0656  WBC 13.3* 11.6*  HGB 7.3* 6.9*  HCT 22.4* 21.6*  PLT 353 334    Recent Labs  06/18/16 0907  NA 137  K 4.7  CL 103  GLUCOSE 160*  BUN 20  CREATININE 1.59*  CALCIUM 9.0   CBG (last 3)   Recent Labs  06/20/16 0507  GLUCAP 111*    Wt Readings from Last 3 Encounters:  06/17/16 80.4 kg (177 lb 3.2 oz)  06/12/16 77.1 kg (170 lb)    Physical Exam:  Constitutional:  No acute distress Eyes:  Pupils reactive to light Neck: Normal range of motion. Neck supple. No thyromegalypresent.  Cardiovascular: tachycardic around 110+  Respiratory:  CTA B GI: abd soft NT/ND Skin. Warm and dry Musculoskeletal: She exhibits edema.  Bilateral LE L>R tr edema  Persists, left thigh sl tender Neurological: She is alert.  Patient is oriented to person, place and time. She provides her date of birth. Fair awareness of deficits. Dense RUE hemiparesis. RLE 1-2/5 prox to distal with extensor pattern. Dysconjugate gaze. 1-2/4 tone RUE/RLE. 3beats clonus right ankle---neuro exam stable Psych: pt cooperative but flat  Assessment/Plan: 1. Functional deficits secondary to debility after bilateral LE DVT's which require 3+ hours per day of interdisciplinary therapy in a comprehensive inpatient rehab setting. Physiatrist is providing close team supervision and 24 hour management of active medical problems listed below. Physiatrist and rehab team continue to assess barriers to  discharge/monitor patient progress toward functional and medical goals.  Function:  Bathing Bathing position   Position: Shower  Bathing parts Body parts bathed by patient: Right arm, Chest, Abdomen, Front perineal area, Buttocks, Right upper leg, Left upper leg, Right lower leg, Left lower leg Body parts bathed by helper: Left arm, Back  Bathing assist Assist Level: Touching or steadying assistance(Pt > 75%)      Upper Body Dressing/Undressing Upper body dressing   What is the patient wearing?: Hospital gown                Upper body assist        Lower Body Dressing/Undressing Lower body dressing   What is the patient wearing?: Hospital Gown, Socks             Socks - Performed by patient: Don/doff right sock, Don/doff left sock Socks - Performed by helper: Don/doff right sock              Lower body assist Assist for lower body dressing: Touching or steadying assistance (Pt > 75%)      Toileting Toileting Toileting activity did not occur: No continent bowel/bladder event Toileting steps completed by patient: Performs perineal hygiene Toileting steps completed by helper: Adjust clothing prior to toileting, Adjust clothing after toileting    Toileting assist Assist level:  (MAX A)   Transfers Chair/bed transfer   Chair/bed transfer method: Stand pivot Chair/bed transfer assist level: Moderate assist (Pt 50 - 74%/lift or lower) Chair/bed transfer assistive device: Armrests  Locomotion Ambulation           Wheelchair   Type: Manual Max wheelchair distance: 100 ft Assist Level: Supervision or verbal cues  Cognition Comprehension Comprehension assist level: Follows basic conversation/direction with no assist  Expression Expression assist level: Expresses basic needs/ideas: With extra time/assistive device  Social Interaction Social Interaction assist level: Interacts appropriately 90% of the time - Needs monitoring or encouragement for  participation or interaction.  Problem Solving Problem solving assist level: Solves basic 90% of the time/requires cueing < 10% of the time  Memory Memory assist level: Recognizes or recalls 50 - 74% of the time/requires cueing 25 - 49% of the time   Medical Problem List and Plan: 1. Debilitationsecondary to bilateral lower extremity DVTs with history of prior left CVA with right hemiparesis -continue therapies 2. DVT Prophylaxis/Anticoagulation:  Coumadin.--INR 2.77 today  -coumadin mgt per pharmacy. Would like to continue for now 3. Pain Management: Neurontin 400 mg 3 times a day, Elavil 25 mg daily at bedtime, Cymbalta 20 mg daily Oxycodone as needed q4 prn 4. Mood: Provide emotional support 5. Neuropsych: This patient iscapable of making decisions on herown behalf. 6. Skin/Wound Care: Routine skin checks 7. Fluids/Electrolytes/Nutrition: encourage PO 8.Hypertension. Lopressor 100 mg twice a day,clonidine 0.2 mg twice a day. Monitor with increased mobility 9.Seizure disorder.Dilantin 100 mg TID 10.AKI/hyponatremia.Renal ultrasound negative. Follow-up chemistries. 11.Leukocytosis. Latest WBC 13k. Chest x-ray atelectasis versus pneumonia.  -stopped augmentin given potential effect on INR -follow labs and clinically   12. Constipation: resolved -continue with scheduled softener and laxative 13. ABLA: hgb down to 6.9-  -INR 2.77  -given tachycardia, will transfuse 2u prbc  -stool OB +, second stool sample pending  -no gross blood loss per rectum 14. HTN:    -increased catapres to tid   LOS (Days) 4 A FACE TO FACE EVALUATION WAS PERFORMED  [name], MD 06/21/2016 8:43 AM

## 2016-06-21 NOTE — Progress Notes (Signed)
[place] for heparin> lovenox/coumadin Indication: DVT (extensive bilateral DVTs)  No Known Allergies  Patient Measurements: Height: 5\' 7"  (170.2 cm) Weight: 177 lb 3.2 oz (80.4 kg) IBW/kg (Calculated) : 61.6 Heparin Dosing Weight: 77 kg  Vital Signs: Temp: 98.9 F (37.2 C) (06/04 1127) Temp Source: Oral (06/04 1127) BP: 134/86 (06/04 1127) Pulse Rate: 109 (06/04 1127)  Labs:  Recent Labs  06/19/16 0625 06/20/16 0659 06/21/16 0656  HGB 7.3* 7.3* 6.9*  HCT 21.9* 22.4* 21.6*  PLT 320 353 334  LABPROT 47.3* 26.9* 29.8*  INR 4.92* 2.44 2.77     Medical History: Past Medical History:  Diagnosis Date  . Hypertension   . Stroke [facility])      Assessment: 56 yo female with extensive bilateral DVTs in R and L lower extremities, and started on IV heparin. Avoiding Xarelto due to significant interaction with phenytoin.  Phenytoin is a strong CYP3A4 and P-gp inducer and should not be used with DOACs (although there is little data with edoxaban).  Plans for anticoagulation are lovenox transition to coumadin with minimum of 5 day overlap.   Pt had 4 days of lovenox/coumadin overlap, INR rise sharply on 6/1 (1.32 >3>5.86) after 7.5mg  on 5/29 and 5mg  on 5/30. lovenox was stopped since 6/2, given persistent INR elevation, low baseline hgb, and poor renal function. Coumadin was restarted yesterday, INR 2.77 this morning from 2.44 yesterday. hgb continue trending down, 6.9 today. Will give 2 units PRBC, No new CMET/BMET. FOB positive on 6/2, recheck 6/3 is pending. PO intake inconsistent.    Goal of Therapy:  INR 2-3 Anti-Xa level 0.6-1 units/ml 4hrs after LMWH dose given Monitor platelets by anticoagulation protocol: Yes   Plan:  -Coumadin 2mg  po x 1 tonight -Daily PT/INR -will not restart lovenox at this point.   Maryanna Shape, PharmD, BCPS  Clinical Pharmacist  Pager: [phone]    06/21/2016 1:02 PM

## 2016-06-21 NOTE — Progress Notes (Signed)
Physical Therapy Session Note  Patient Details  Name: Ann Lynch MRN: [id number] Date of Birth: [birth year]  Today's Date: 06/21/2016 PT Individual Time Calculation (min): 70 min    Skilled Therapeutic Interventions/Progress Updates: Pt with 60 min skilled PT due to nsg care and pt set up for transfusion. Will continue efforts.      Therapy Documentation Precautions:  Precautions Precautions: Fall Precaution Comments: right hemiplegia Restrictions Weight Bearing Restrictions: No General: PT Amount of Missed Time (min): 60 Minutes PT Missed Treatment Reason: Nursing care Vital Signs: Therapy Vitals Temp: 98.8 F (37.1 C) Temp Source: Oral Pulse Rate: 88 Resp: 18 BP: 111/67 Oxygen Therapy SpO2: 100 % O2 Device: Not Delivered     See Function Navigator for Current Functional Status.   Therapy/Group: Individual Therapy  Harbor Paster  Kyrell Ruacho, PTA  06/21/2016, 4:15 PM

## 2016-06-22 ENCOUNTER — Inpatient Hospital Stay (HOSPITAL_COMMUNITY): Payer: Medicare HMO | Admitting: Occupational Therapy

## 2016-06-22 ENCOUNTER — Inpatient Hospital Stay (HOSPITAL_COMMUNITY): Payer: Medicare HMO | Admitting: Physical Therapy

## 2016-06-22 ENCOUNTER — Inpatient Hospital Stay (HOSPITAL_COMMUNITY): Payer: Medicare HMO

## 2016-06-22 LAB — TYPE AND SCREEN
ABO/RH(D): O POS
Antibody Screen: NEGATIVE
Unit division: 0
Unit division: 0

## 2016-06-22 LAB — CBC
HCT: 27.3 % — ABNORMAL LOW (ref 36.0–46.0)
Hemoglobin: 9 g/dL — ABNORMAL LOW (ref 12.0–15.0)
MCH: 29.7 pg (ref 26.0–34.0)
MCHC: 33 g/dL (ref 30.0–36.0)
MCV: 90.1 fL (ref 78.0–100.0)
PLATELETS: 313 10*3/uL (ref 150–400)
RBC: 3.03 MIL/uL — AB (ref 3.87–5.11)
RDW: 15.1 % (ref 11.5–15.5)
WBC: 11.8 10*3/uL — ABNORMAL HIGH (ref 4.0–10.5)

## 2016-06-22 LAB — BPAM RBC
Blood Product Expiration Date: 201806262359
Blood Product Expiration Date: 201806262359
ISSUE DATE / TIME: 201806041052
ISSUE DATE / TIME: 201806041525
Unit Type and Rh: 5100
Unit Type and Rh: 5100

## 2016-06-22 LAB — PROTIME-INR
INR: 2.98
PROTHROMBIN TIME: 31.6 s — AB (ref 11.4–15.2)

## 2016-06-22 MED ORDER — WARFARIN SODIUM 1 MG PO TABS
1.0000 mg | ORAL_TABLET | Freq: Once | ORAL | Status: AC
  Administered 2016-06-22: 1 mg via ORAL
  Filled 2016-06-22: qty 1

## 2016-06-22 NOTE — Progress Notes (Signed)
Physical Therapy Session Note  Patient Details  Name: Ann Lynch MRN: [id number] Date of Birth: 02-Jul-1960  Today's Date: 06/22/2016 PT Individual Time: 0800-0900 PT Individual Time Calculation (min): 60 min   Short Term Goals: Week 1:  PT Short Term Goal 1 (Week 1): Pt will perform sit>stand with mod A.  PT Short Term Goal 2 (Week 1): Pt will perform bed mobility with min A.  PT Short Term Goal 3 (Week 1): Pt will propel manual w/c 150' with min A.  PT Short Term Goal 4 (Week 1): Pt will transfer bed<>w/c with mod A.  PT Short Term Goal 5 (Week 1): Pt will initiate gait training with max A.   Skilled Therapeutic Interventions/Progress Updates:  Pt received sitting on BSC with RN. Pt requested to return to bed in order to donn clothes. Mod A squat pivot transfer from [facility] to supine in bed. Pt selected clothing and required min A for supine>sit for RLE management. Min A for upper body dressing. Pt attempted lower body dressing independently but had difficulty maintaining sitting balance on EOB during dressing and required max A to donn pants, socks, and shoes. Mod A squat pivot transfer from bed>w/c. Pt propelled w/c approximately 5ft using L hemi technique but required verbal and tactile cues to remind pt to use her LUE to assist with w/c propulsion. Pt mod A for sit>stand from w/c with verbal cues for L hand placement and manual facilitation at R knee. Pt provided with tactile and verbal cuing for hip, trunk, and R knee extension to improve upright posture during standing. Pt ambulated 35ft x3 with max A for management of RLE and manual facilitation of R knee extension, hip extension, and trunk extension. Pt LUE handheld assist on rail. Mirror placed in front of pt during standing and ambulation for visual feedback to improve upright posture. Upon returning to pt's room she reported a need to use the restroom. Pt mod A for stand pivot transfer from w/c<>toilet with assistance for management  of pants and brief. Pt left seated in w/c with all needs in reach.   Therapy Documentation Precautions:  Precautions Precautions: Fall Precaution Comments: right hemiplegia Restrictions Weight Bearing Restrictions: No   See Function Navigator for Current Functional Status.   Therapy/Group: Individual Therapy  Alysia Penna 06/22/2016, 2:35 PM

## 2016-06-22 NOTE — Progress Notes (Signed)
[facility]IC[facility] RE[facility]ILITATION     PROGRESS NOTE    Subjective/Complaints: Feels well. Tolerated blood. Pain better in thigh. Anxious to get home  ROS: pt denies nausea, vomiting, diarrhea, cough, shortness of breath or chest pain     Objective: Vital Signs: Blood pressure (!) 144/91, pulse 84, temperature 98.4 F (36.9 C), temperature source Oral, resp. rate 16, height 5\' 7"  (1.702 m), weight 80.4 kg (177 lb 3.2 oz), SpO2 99 %. No results found.  Recent Labs  06/21/16 0656 06/22/16 0558  WBC 11.6* 11.8*  HGB 6.9* 9.0*  HCT 21.6* 27.3*  PLT 334 313   No results for input(s): NA, K, CL, GLUCOSE, BUN, CREATININE, CALCIUM in the last 72 hours.  Invalid input(s): CO CBG (last 3)   Recent Labs  06/20/16 0507  GLUCAP 111*    Wt Readings from Last 3 Encounters:  06/17/16 80.4 kg (177 lb 3.2 oz)  06/12/16 77.1 kg (170 lb)    Physical Exam:  Constitutional:  No acute distress Eyes:  Pupils reactive to light Neck: Normal range of motion. Neck supple. No thyromegalypresent.  Cardiovascular: tachy around 100 this morning  Respiratory:  CTA B GI: abd soft NT/ND Skin. warm Musculoskeletal: She exhibits edema.  Bilateral LE L>R tr edema  Persists, left thigh less tender Neurological: She is alert.  Patient is oriented to person, place and time. She provides her date of birth. Fair awareness of deficits. Dense RUE hemiparesis. RLE 1-2/5 prox to distal with extensor pattern. Dysconjugate gaze. 1-2/4 tone RUE/RLE. 3beats clonus right ankle---neuro exam unchanged Psych: pt cooperative but flat  Assessment/Plan: 1. Functional deficits secondary to debility after bilateral LE DVT's which require 3+ hours per day of interdisciplinary therapy in a comprehensive inpatient rehab setting. Physiatrist is providing close team supervision and 24 hour management of active medical problems listed below. Physiatrist and rehab team continue to assess barriers to  discharge/monitor patient progress toward functional and medical goals.  Function:  Bathing Bathing position   Position: Shower  Bathing parts Body parts bathed by patient: Right arm, Chest, Abdomen, Front perineal area, Buttocks, Right upper leg, Left upper leg, Right lower leg, Left lower leg Body parts bathed by helper: Left arm, Back  Bathing assist Assist Level: Touching or steadying assistance(Pt > 75%)      Upper Body Dressing/Undressing Upper body dressing   What is the patient wearing?: Pull over shirt/dress     Pull over shirt/dress - Perfomed by patient: Thread/unthread right sleeve, Thread/unthread left sleeve, Put head through opening, Pull shirt over trunk          Upper body assist        Lower Body Dressing/Undressing Lower body dressing   What is the patient wearing?: Pants, Non-skid slipper socks, Ted Hose       Pants- Performed by helper: Thread/unthread right pants leg, Thread/unthread left pants leg, Pull pants up/down   Non-skid slipper socks- Performed by helper: Don/doff right sock, Don/doff left sock Socks - Performed by patient: Don/doff right sock, Don/doff left sock Socks - Performed by helper: Don/doff right sock           TED Hose - Performed by helper: Don/doff right TED hose, Don/doff left TED hose  Lower body assist Assist for lower body dressing: Touching or steadying assistance (Pt > 75%)      Toileting Toileting Toileting activity did not occur: No continent bowel/bladder event Toileting steps completed by patient: Performs perineal hygiene Toileting steps completed by  helper: Adjust clothing prior to toileting, Adjust clothing after toileting Toileting Assistive Devices: Grab bar or rail  Toileting assist Assist level: Two helpers, More than reasonable time   Transfers Chair/bed transfer   Chair/bed transfer method: Stand pivot Chair/bed transfer assist level: Moderate assist (Pt 50 - 74%/lift or lower) Chair/bed transfer  assistive device: Armrests     Locomotion Ambulation           Wheelchair   Type: Manual Max wheelchair distance: 150 ft Assist Level: Touching or steadying assistance (Pt > 75%)  Cognition Comprehension Comprehension assist level: Follows basic conversation/direction with extra time/assistive device  Expression Expression assist level: Expresses basic needs/ideas: With no assist  Social Interaction Social Interaction assist level: Interacts appropriately with others - No medications needed.  Problem Solving Problem solving assist level: Solves basic 90% of the time/requires cueing < 10% of the time  Memory Memory assist level: Recognizes or recalls 90% of the time/requires cueing < 10% of the time   Medical Problem List and Plan: 1. Debilitationsecondary to bilateral lower extremity DVTs with history of prior left CVA with right hemiparesis -continue therapies. She is anxious to get home  -team conf today 2. DVT Prophylaxis/Anticoagulation:  Coumadin.--INR 2.77 today  -coumadin mgt per pharmacy. Would like to continue for now 3. Pain Management: Neurontin 400 mg 3 times a day, Elavil 25 mg daily at bedtime, Cymbalta 20 mg daily Oxycodone as needed q4 prn 4. Mood: Provide emotional support 5. Neuropsych: This patient iscapable of making decisions on herown behalf. 6. Skin/Wound Care: Routine skin checks 7. Fluids/Electrolytes/Nutrition: encourage PO 8.Hypertension. Lopressor 100 mg twice a day,clonidine 0.2 mg twice a day. Monitor with increased mobility 9.Seizure disorder.Dilantin 100 mg TID 10.AKI/hyponatremia.Renal ultrasound negative. Follow-up chemistries. 11.Leukocytosis. Latest WBC 11k.  No clinical signs of infection.  -stopped augmentin given potential effect on INR -follow labs and clinically   12. Constipation: resolved -continue with scheduled softener and laxative 13. ABLA: hgb up to 9 after transfusion  -INR  2.98  -2u prbc 6/4  -stool OB +, second stool sample neg for OB  -no gross blood loss per rectum 14. HTN:    -  catapres 0.2mg  tid   LOS (Days) 5 A FACE TO FACE EVALUATION WAS PERFORMED  [name], MD 06/22/2016 8:43 AM

## 2016-06-22 NOTE — Progress Notes (Signed)
Occupational Therapy Session Note  Patient Details  Name: [name] MRN: [id number] Date of Birth: 1960-10-09  Today's Date: 06/22/2016 OT Individual Time: 1000-1055 OT Individual Time Calculation (min): 55 min    Short Term Goals: Week 1:  OT Short Term Goal 1 (Week 1): Pt will maintain static standing balance with MIN A in prep for clothing management OT Short Term Goal 2 (Week 1): Pt will don footwear with supervision OT Short Term Goal 3 (Week 1): Pt will perform LB bathing with min a and AE prn OT Short Term Goal 4 (Week 1): Pt will stand at sink for 2 grooming items to improve funcitonal endurance  Skilled Therapeutic Interventions/Progress Updates:    Pt sitting EOM in gym upon arrival.  Initial focus on dynamic sitting balance for functional tasks.  Pt's RUE nonfunctional 2/2 previous stroke (2009). Pt completed two games of Connect 4 prior to performing squat pivot transfer to w/c with mod A.  Pt transitioned to ADL apartment and practiced toilet transfers X 2 and tub bench transfers X 2.  Pt able to direct care.  Pt performed all transfers at mod A level.  Pt declined therapist's assistance for toileting tasks stating she preferred a female assist with all "personal care."  Scheduling board updated to note this preference.  Pt required max A for standing balance at sink.    Therapy Documentation Precautions:  Precautions Precautions: Fall Precaution Comments: right hemiplegia Restrictions Weight Bearing Restrictions: No Pain:  Pt denied pain  See Function Navigator for Current Functional Status.   Therapy/Group: Individual Therapy  Leroy Libman 06/22/2016, 11:05 AM

## 2016-06-22 NOTE — Progress Notes (Signed)
Occupational Therapy Session Note  Patient Details  Name: Ann Lynch MRN: [id number] Date of Birth: 12/12/1960  Today's Date: 06/22/2016 OT Individual Time: 3474-2595 OT Individual Time Calculation (min): 54 min    Short Term Goals: Week 1:  OT Short Term Goal 1 (Week 1): Pt will maintain static standing balance with MIN A in prep for clothing management OT Short Term Goal 2 (Week 1): Pt will don footwear with supervision OT Short Term Goal 3 (Week 1): Pt will perform LB bathing with min a and AE prn OT Short Term Goal 4 (Week 1): Pt will stand at sink for 2 grooming items to improve funcitonal endurance  Skilled Therapeutic Interventions/Progress Updates:    Pt seen for OT session focusing on functional standing balance, upright posture, and functional transfers. Pt sitting up in w/c upon arrival, agreeable to tx session.  In BI gym, attempted standing Dynavision task focusing on upright posture, attention and standing balance. Pt required mod A to stand, HHA for R UE. Pt intermittently letting go of L w/c arm support in standing to complete Dynavision with mod A standing balance. Attempted second trial with use of RW for balance, however, pt voiced pain in R hand when it was positioned on RW and pt not confident with balance to hold onto L RW for support and pt terminated task. She self propelled w/c to threapy gym using L LE and VCs to initiate use of L UE into task. In gym, completed x3 squat pivot transfers w/c <> mat. Required initially max A, progressing to mod A with verbal and tactile cuing for sequencing and hand placement during transfers.  Voiced need for toileting task. In room, completed stand pivot transfer to toilet using grab bars, total A for clothing management due to decreased standing balance, hygiene completed in seated position. Completed squat pivot transfer back to chair with mod A. Completed hand hygiene in standing with min A for standing balance despite bearing  weight only through R LE. Pt assisted with set-up of lunch tray, left seated in w/c set-up with meal tray and all needs in reach.   Therapy Documentation Precautions:  Precautions Precautions: Fall Precaution Comments: right hemiplegia Restrictions Weight Bearing Restrictions: No Pain:   No/ denies pain  See Function Navigator for Current Functional Status.   Therapy/Group: Individual Therapy  Lewis, Jonathandavid Marlett C 06/22/2016, 7:13 AM

## 2016-06-22 NOTE — Progress Notes (Signed)
[facility] for heparin> lovenox/coumadin Indication: DVT (extensive bilateral DVTs)  No Known Allergies  Patient Measurements: Height: 5\' 7"  (170.2 cm) Weight: 177 lb 3.2 oz (80.4 kg) IBW/kg (Calculated) : 61.6 Heparin Dosing Weight: 77 kg  Vital Signs: Temp: 98.4 F (36.9 C) (06/05 0447) Temp Source: Oral (06/05 0447) BP: 144/91 (06/05 0447) Pulse Rate: 84 (06/05 0447)  Labs:  Recent Labs  06/20/16 0659 06/21/16 0656 06/22/16 0558  HGB 7.3* 6.9* 9.0*  HCT 22.4* 21.6* 27.3*  PLT 353 334 313  LABPROT 26.9* 29.8* 31.6*  INR 2.44 2.77 2.98     Medical History: Past Medical History:  Diagnosis Date  . Hypertension   . Stroke [facility])      Assessment: 56 yo female with extensive bilateral DVTs in R and L lower extremities, and started on IV heparin. Avoiding Xarelto due to significant interaction with phenytoin.  Phenytoin is a strong CYP3A4 and P-gp inducer and should not be used with DOACs (although there is little data with edoxaban).  Plans for anticoagulation are lovenox transition to coumadin with minimum of 5 day overlap.   Pt had 4 days of lovenox/coumadin overlap, INR rise sharply on 6/1 (1.32 >3>5.86) after 7.5mg  on 5/29 and 5mg  on 5/30. lovenox was stopped since 6/2, given persistent INR elevation, low baseline hgb, and poor renal function. Coumadin was restarted on 6/3, INR 2.77 > 2.98 this morning. Hgb 6.9 >9 after 2 units PRBC FOB positive on 6/2, recheck 6/3 is neg. PO intake inconsistent.    Goal of Therapy:  INR 2-3 Monitor platelets by anticoagulation protocol: Yes   Plan:  -Coumadin 1 mg po x 1 tonight, likely will only need 2mg  daily -Daily PT/INR  Maryanna Shape, PharmD, BCPS  Clinical Pharmacist  Pager: [phone]    06/22/2016 1:29 PM

## 2016-06-22 NOTE — Progress Notes (Signed)
Occupational Therapy Note  Patient Details  Name: MICHAELANN GUNNOE MRN: [id number] Date of Birth: 11/19/1960  Today's Date: 06/22/2016 OT Individual Time: 8676-7209 OT Individual Time Calculation (min): 27 min   No c/o pain  Pt received in w.c in room and agreeable to therapy.  Pt taken to gym and she completed a squat pivot transfer to her L with mod A.  On mat, pt worked on postural skills of upright posture while reaching upward with LUE, wt shifting and reaching to her L,  And midline crossing with reaching to the R while engaging in a game of Connect 4. Pt had not played the game before, but followed directions well with the game.  Discussed her PLOF and what is different today as far as her functional skills. Pt was not able to identify any changes except that she feels weaker. She states that she watches a lot of TV.  Discussed ways she can actively engage with her nephews with games.  Pt's next therapist arrived for her next session.     Leslie 06/22/2016, 12:29 PM

## 2016-06-23 ENCOUNTER — Inpatient Hospital Stay (HOSPITAL_COMMUNITY): Payer: Medicare HMO | Admitting: Physical Therapy

## 2016-06-23 ENCOUNTER — Inpatient Hospital Stay (HOSPITAL_COMMUNITY): Payer: Medicare HMO | Admitting: Occupational Therapy

## 2016-06-23 LAB — BASIC METABOLIC PANEL
Anion gap: 11 (ref 5–15)
BUN: 16 mg/dL (ref 6–20)
CALCIUM: 9 mg/dL (ref 8.9–10.3)
CO2: 26 mmol/L (ref 22–32)
Chloride: 99 mmol/L — ABNORMAL LOW (ref 101–111)
Creatinine, Ser: 1.08 mg/dL — ABNORMAL HIGH (ref 0.44–1.00)
GFR calc non Af Amer: 58 mL/min — ABNORMAL LOW (ref >60)
Glucose, Bld: 106 mg/dL — ABNORMAL HIGH (ref 65–99)
Potassium: 4.1 mmol/L (ref 3.5–5.1)
Sodium: 136 mmol/L (ref 135–145)

## 2016-06-23 LAB — PROTIME-INR
INR: 3.23
Prothrombin Time: 33.7 seconds — ABNORMAL HIGH (ref 11.4–15.2)

## 2016-06-23 LAB — CBC
HEMATOCRIT: 29.3 % — AB (ref 36.0–46.0)
HEMOGLOBIN: 9.5 g/dL — AB (ref 12.0–15.0)
MCH: 29.7 pg (ref 26.0–34.0)
MCHC: 32.4 g/dL (ref 30.0–36.0)
MCV: 91.6 fL (ref 78.0–100.0)
Platelets: 383 10*3/uL (ref 150–400)
RBC: 3.2 MIL/uL — AB (ref 3.87–5.11)
RDW: 15.1 % (ref 11.5–15.5)
WBC: 11.6 10*3/uL — ABNORMAL HIGH (ref 4.0–10.5)

## 2016-06-23 MED ORDER — WARFARIN SODIUM 1 MG PO TABS
1.0000 mg | ORAL_TABLET | Freq: Once | ORAL | Status: AC
  Administered 2016-06-23: 1 mg via ORAL
  Filled 2016-06-23: qty 1

## 2016-06-23 NOTE — Progress Notes (Signed)
Social Work Patient ID: Ann Lynch, female   DOB: 21-Jan-1960, 56 y.o.   MRN: [id number]   CSW met with pt and spoke to pt's mother via telephone to update them on team conference discussion and targeted d/c date of 06-26-16.  Pt's mother will be prepared for her at home and DME was discussed.  Mother will come for family education on 06-25-16.  Pt is ready to go home, but mother understands need for a few more days.  HH to be arranged at home and DME ordered.  CSW will continue to follow and assist as needed.

## 2016-06-23 NOTE — Progress Notes (Signed)
Occupational Therapy Session Note  Patient Details  Name: Ann Lynch MRN: [id number] Date of Birth: [birth year]  Today's Date: 06/23/2016 OT Individual Time: 0700-0800 OT Individual Time Calculation (min): 60 min    Short Term Goals: Week 1:  OT Short Term Goal 1 (Week 1): Pt will maintain static standing balance with MIN A in prep for clothing management OT Short Term Goal 2 (Week 1): Pt will don footwear with supervision OT Short Term Goal 3 (Week 1): Pt will perform LB bathing with min a and AE prn OT Short Term Goal 4 (Week 1): Pt will stand at sink for 2 grooming items to improve funcitonal endurance  Skilled Therapeutic Interventions/Progress Updates: Patient participated in skilled OT this morning as follows:     Supine to EOB=Mod A;   EOB to w/c on patient's left, stronger side=Min A Squat Pivot;     Oral care seated in w/c  = supervision (she refused to try using right hand as stabilizer and stated, "I do not mess with that hand.");        UB bathing and dressing in w/c at sink = extra time and min A to pull down top on her right side (she refused to dress on hemiside first - she stated, "I do not do it that way.   This works fine."   Patient and nursing tech changed brief before this session and patient refused to wash legs or feet today.   She required min A to go sit to stand but required max assist to maintain balance with a kyphotic posture and to pull up pants.   Dynamic standing balance = Poor                    She was able to maintain poor dynamic standing balance for about 6 seconds before asking to sit back down due to fatigue.  Patient needs overall max assist to pull down bra, shirt and pull up her pants and clothing on her hemi right side of body.   She will benefit from more opportunities to correct standing posture and to work on standing activity tolerance.  Patient was left seated in her w/c with her call bell and phone within reach.   Her doc came in to speak  with her soon afterwards.     Therapy Documentation Precautions:  Precautions Precautions: Fall Precaution Comments: right hemiplegia Restrictions Weight Bearing Restrictions: No  Pain:denied   See Function Navigator for Current Functional Status.   Therapy/Group: Individual Therapy  Alfredia Ferguson [facility] 06/23/2016, 7:53 AM

## 2016-06-23 NOTE — Progress Notes (Signed)
ANTICOAGULATION CONSULT NOTE - Follow Up Consult  Pharmacy Consult for coumadin Indication: DVT  No Known Allergies  Patient Measurements: Height: 5\' 7"  (170.2 cm) Weight: 177 lb 3.2 oz (80.4 kg) IBW/kg (Calculated) : 61.6 Heparin Dosing Weight:   Vital Signs: Temp: 98.5 F (36.9 C) (06/06 0405) Temp Source: Oral (06/06 0405) BP: 159/92 (06/06 0405) Pulse Rate: 90 (06/06 0405)  Labs:  Recent Labs  06/21/16 0656 06/22/16 0558 06/23/16 0642  HGB 6.9* 9.0* 9.5*  HCT 21.6* 27.3* 29.3*  PLT 334 313 383  LABPROT 29.8* 31.6* 33.7*  INR 2.77 2.98 3.23  CREATININE  --   --  1.08*    Estimated Creatinine Clearance: 65.7 mL/min (A) (by C-G formula based on SCr of 1.08 mg/dL (H)).   Medications:  Scheduled:  . amitriptyline  25 mg Oral QHS  . aspirin EC  81 mg Oral Daily  . atorvastatin  40 mg Oral QHS  . cloNIDine  0.2 mg Oral TID  . DULoxetine  20 mg Oral QHS  . feeding supplement (ENSURE ENLIVE)  237 mL Oral BID BM  . folic acid  1 mg Oral Daily  . gabapentin  400 mg Oral TID  . metoprolol tartrate  100 mg Oral BID  . pantoprazole  40 mg Oral BID  . phenytoin  100 mg Oral TID  . polyethylene glycol  17 g Oral Daily  . senna-docusate  1 tablet Oral BID  . Warfarin - Pharmacist Dosing Inpatient   Does not apply q1800   Infusions:    Assessment: 56 yo female with DVT is currently on supratherapeutic coumadin.  INR today is 3.23 probably due to 4 mg dose few days ago.  Hgb 9.5  Goal of Therapy:  INR 2-3 Monitor platelets by anticoagulation protocol: Yes   Plan:  - coumadin 1 mg po x1 - INR in am  Clarrissa Shimkus, Tsz-Yin 06/23/2016,8:18 AM

## 2016-06-23 NOTE — Progress Notes (Signed)
Physical Therapy Session Note  Patient Details  Name: Ann Lynch MRN: [id number] Date of Birth: 06-12-1960  Today's Date: 06/23/2016 PT Individual Time: 1000-1100 PT Individual Time Calculation (min): 60 min   Short Term Goals: Week 1:  PT Short Term Goal 1 (Week 1): Pt will perform sit>stand with mod A.  PT Short Term Goal 2 (Week 1): Pt will perform bed mobility with min A.  PT Short Term Goal 3 (Week 1): Pt will propel manual w/c 150' with min A.  PT Short Term Goal 4 (Week 1): Pt will transfer bed<>w/c with mod A.  PT Short Term Goal 5 (Week 1): Pt will initiate gait training with max A.   Skilled Therapeutic Interventions/Progress Updates:  Pt received seated in manual w/c. Pt propelled w/c with S using L hemi technique approximately 36ft with verbal cues to use her L UE to assist with w/c propulsion. Ground reaction AFO donned on pt's R foot to assist with R LE clearance during ambulation. Pt ambulated 5-48ft x 3 with max A for management of R LE and manual facilitation for R knee extension hip extension. Mirror used to provided visual feedback and verbal cues for upright posture during standing and ambulation. Standing in standing frame x 4 min with verbal and tactile cues and manual facilitation for upright posture during standing. Pt complained of L LE fatigue and R LE pain and requested to sit after 4 min. Mod A squat pivot transfer from w/c>mat to pt's R side. Min A for sit<>supine on mat table for LE management. Pt min A for rolling R/L for LE management. Attempted rolling prone from R/L sidelying but uncomfortable for the pt. Mod A stand pivot transfer from mat>w/c for manual facilitation at R knee and assist with pivot portion of transfer. Pt reports need to use the restroom. Mod A stand pivot transfer from w/c to toilet with assistance for management of pants and brief. Pt left seated in w/c with all needs in reach.   Therapy Documentation Precautions:   Precautions Precautions: Fall Precaution Comments: right hemiplegia Restrictions Weight Bearing Restrictions: No   See Function Navigator for Current Functional Status.   Therapy/Group: Individual Therapy  Alysia Penna 06/23/2016, 12:58 PM

## 2016-06-23 NOTE — Progress Notes (Signed)
Physical Therapy Session Note  Patient Details  Name: Ann Lynch MRN: [id number] Date of Birth: 13-Apr-1960  Today's Date: 06/23/2016 PT Individual Time: 5277-8242 PT Individual Time Calculation (min): 30 min   Short Term Goals: Week 1:  PT Short Term Goal 1 (Week 1): Pt will perform sit>stand with mod A.  PT Short Term Goal 2 (Week 1): Pt will perform bed mobility with min A.  PT Short Term Goal 3 (Week 1): Pt will propel manual w/c 150' with min A.  PT Short Term Goal 4 (Week 1): Pt will transfer bed<>w/c with mod A.  PT Short Term Goal 5 (Week 1): Pt will initiate gait training with max A.   Skilled Therapeutic Interventions/Progress Updates:    Pt sitting in w/c upon arrival, agreeable to PT session. Propelling w/c 75 ft with supervision and encouragement. Sit<>stand performed from w/c, X1 without additional UE support and X5 using hall rail. Pt quickly fatiguing. Working on posture as well as weightshift to Rt side with pt blocking knee as needed. Follwing session, pt returned to w/c in room with all needs in reach.   Therapy Documentation Precautions:  Precautions Precautions: Fall Precaution Comments: right hemiplegia Restrictions Weight Bearing Restrictions: No Pain:  Reports mild Lt knee pain, monitored during session.   See Function Navigator for Current Functional Status.   Therapy/Group: Individual Therapy  Linard Millers, PT 06/23/2016, 5:11 PM

## 2016-06-23 NOTE — Progress Notes (Signed)
[place] PHYSICAL MEDICINE & REHABILITATION     PROGRESS NOTE    Subjective/Complaints: No new issues. Anxious to go home. Pain better  ROS: pt denies nausea, vomiting, diarrhea, cough, shortness of breath or chest pain      Objective: Vital Signs: Blood pressure (!) 159/92, pulse 90, temperature 98.5 F (36.9 C), temperature source Oral, resp. rate 18, height 5\' 7"  (1.702 m), weight 80.4 kg (177 lb 3.2 oz), SpO2 94 %. No results found.  Recent Labs  06/22/16 0558 06/23/16 0642  WBC 11.8* 11.6*  HGB 9.0* 9.5*  HCT 27.3* 29.3*  PLT 313 383    Recent Labs  06/23/16 0642  NA 136  K 4.1  CL 99*  GLUCOSE 106*  BUN 16  CREATININE 1.08*  CALCIUM 9.0   CBG (last 3)  No results for input(s): GLUCAP in the last 72 hours.  Wt Readings from Last 3 Encounters:  06/17/16 80.4 kg (177 lb 3.2 oz)  06/12/16 77.1 kg (170 lb)    Physical Exam:  Constitutional:  No acute distress Eyes:  Pupils reactive to light Neck: Normal range of motion. Neck supple. No thyromegalypresent.  Cardiovascular: HR in 90's. reg  Respiratory:  cta bilaterally GI: abd soft NT/ND Skin. warm Musculoskeletal: She exhibits edema.  Bilateral LE L>R tr edema present Neurological: She is alert.  Patient is oriented to person, place and time. She provides her date of birth. Fair awareness of deficits. Dense RUE hemiparesis. RLE 1-2/5 prox to distal with extensor pattern. Dysconjugate gaze. 1-2/4 tone RUE/RLE. 3beats clonus right ankle---neuro exam stable Psych: pt cooperative but flat  Assessment/Plan: 1. Functional deficits secondary to debility after bilateral LE DVT's which require 3+ hours per day of interdisciplinary therapy in a comprehensive inpatient rehab setting. Physiatrist is providing close team supervision and 24 hour management of active medical problems listed below. Physiatrist and rehab team continue to assess barriers to discharge/monitor patient progress toward  functional and medical goals.  Function:  Bathing Bathing position   Position: Wheelchair/chair at sink (she refused to wash lower body today and nurse tech had just cleansed periarea and changed brief with patient supinein bed just prior to OT session)  Bathing parts Body parts bathed by patient: Right arm, Left arm, Chest, Abdomen Body parts bathed by helper: Left arm, Back  Bathing assist Assist Level: Touching or steadying assistance(Pt > 75%)      Upper Body Dressing/Undressing Upper body dressing   What is the patient wearing?: Pull over shirt/dress, Bra Bra - Perfomed by patient: Thread/unthread right bra strap, Thread/unthread left bra strap Bra - Perfomed by helper: Hook/unhook bra (pull down sports bra) Pull over shirt/dress - Perfomed by patient: Thread/unthread right sleeve, Thread/unthread left sleeve, Put head through opening Pull over shirt/dress - Perfomed by helper: Pull shirt over trunk        Upper body assist Assist Level: Touching or steadying assistance(Pt > 75%)      Lower Body Dressing/Undressing Lower body dressing   What is the patient wearing?: Pants, Socks, Shoes     Pants- Performed by patient: Thread/unthread left pants leg Pants- Performed by helper: Thread/unthread right pants leg, Pull pants up/down   Non-skid slipper socks- Performed by helper: Don/doff right sock, Don/doff left sock Socks - Performed by patient: Don/doff right sock, Don/doff left sock Socks - Performed by helper: Don/doff right sock, Don/doff left sock   Shoes - Performed by helper: Don/doff right shoe, Don/doff left shoe, Fasten right, Fasten left  TED Hose - Performed by helper: Don/doff right TED hose, Don/doff left TED hose  Lower body assist Assist for lower body dressing:  (max A)      Toileting Toileting Toileting activity did not occur: No continent bowel/bladder event Toileting steps completed by patient: Performs perineal hygiene Toileting steps  completed by helper: Adjust clothing prior to toileting, Adjust clothing after toileting Toileting Assistive Devices: Grab bar or rail  Toileting assist Assist level: Touching or steadying assistance (Pt.75%)   Transfers Chair/bed transfer   Chair/bed transfer method: Squat pivot, Stand pivot Chair/bed transfer assist level: Moderate assist (Pt 50 - 74%/lift or lower) Chair/bed transfer assistive device: Armrests     Locomotion Ambulation     Max distance: 34ft Assist level: Maximal assist (Pt 25 - 49%)   Wheelchair   Type: Manual Max wheelchair distance: 75 Assist Level: Touching or steadying assistance (Pt > 75%)  Cognition Comprehension Comprehension assist level: Follows basic conversation/direction with extra time/assistive device  Expression Expression assist level: Expresses basic needs/ideas: With no assist  Social Interaction Social Interaction assist level: Interacts appropriately with others - No medications needed.  Problem Solving Problem solving assist level: Solves basic 90% of the time/requires cueing < 10% of the time  Memory Memory assist level: Recognizes or recalls 90% of the time/requires cueing < 10% of the time   Medical Problem List and Plan: 1. Debilitationsecondary to bilateral lower extremity DVTs with history of prior left CVA with right hemiparesis -continue therapies. She is anxious to get home  -spoke with mother today about condition and progress 2. DVT Prophylaxis/Anticoagulation:  Coumadin.--INR 3.23 today  -coumadin mgt per pharmacy.   3. Pain Management: Neurontin 400 mg 3 times a day, Elavil 25 mg daily at bedtime, Cymbalta 20 mg daily Oxycodone as needed q4 prn 4. Mood: Provide emotional support 5. Neuropsych: This patient iscapable of making decisions on herown behalf. 6. Skin/Wound Care: Routine skin checks 7. Fluids/Electrolytes/Nutrition: encourage PO 8.Hypertension. Lopressor 100 mg twice a day,clonidine 0.2 mg twice  a day. Monitor with increased mobility 9.Seizure disorder.Dilantin 100 mg TID 10.AKI/hyponatremia.Renal ultrasound negative. Follow-up chemistries. 11.Leukocytosis. Latest WBC stable/decreased at 11k.  No clinical signs of infection.  -stopped augmentin given potential effect on INR    12. Constipation: resolved -continue with scheduled softener and laxative 13. ABLA: hgb up to 9.5 today  -INR supratherapeutic  -2u prbc 6/4  -stool OB +, second stool sample neg for OB  -no gross blood loss per rectum 14. HTN:    -  catapres 0.2mg  tid---no changes today   LOS (Days) 6 A FACE TO FACE EVALUATION WAS PERFORMED  [name], MD 06/23/2016 8:47 AM

## 2016-06-23 NOTE — Progress Notes (Signed)
Physical Therapy Session Note  Patient Details  Name: Ann Lynch MRN: [id number] Date of Birth: Jul 16, 1960  Today's Date: 06/23/2016 PT Individual Time: 7356-7014 PT Individual Time Calculation (min): 30 min   Short Term Goals: Week 1:  PT Short Term Goal 1 (Week 1): Pt will perform sit>stand with mod A.  PT Short Term Goal 2 (Week 1): Pt will perform bed mobility with min A.  PT Short Term Goal 3 (Week 1): Pt will propel manual w/c 150' with min A.  PT Short Term Goal 4 (Week 1): Pt will transfer bed<>w/c with mod A.  PT Short Term Goal 5 (Week 1): Pt will initiate gait training with max A.   Skilled Therapeutic Interventions/Progress Updates:  Pt received in w/c & agreeable to tx. Session focused on sitting balance, transfers, and w/c mobility. During session pt transferred w/c<>mat table and w/c<>low, compliant couch with mod assist overall (pt did require max assist for couch>w/c 2/2 height difference) to L and R with therapist blocking R knee and assisting with weight shifting during pivoting portion. On mat table in gym pt engaged in shooting basketball with task addressing sitting balance and head midline orientation. Pt able to self correct head orientation with cuing to do so, otherwise she maintains head leaning to L. Pt propelled w/c dayroom>room with L hemi technique and supervision overall; pt did not require any cuing for obstacle avoidance. At end of session pt left sitting in w/c in room with all needs within reach.  Therapy Documentation Precautions:  Precautions Precautions: Fall Precaution Comments: right hemiplegia Restrictions Weight Bearing Restrictions: No  Pain: At end of session pt reported pain in posterior R knee - RN made aware.   See Function Navigator for Current Functional Status.   Therapy/Group: Individual Therapy  Waunita Schooner 06/23/2016, 11:27 AM

## 2016-06-23 NOTE — Patient Care Conference (Signed)
Inpatient RehabilitationTeam Conference and Plan of Care Update Date: 06/22/2016   Time: 2:00 PM    Patient Name: Ann Lynch      Medical Record Number: [id number]  Date of Birth: 10/07/1960 Sex: Female         Room/Bed: 4W06C/4W06C-01 Payor Info: Payor: [employer] MEDICARE / Plan: [employer] MEDICARE HMO/PPO / Product Type: *No Product type* /    Admitting Diagnosis: L CVA and Debility  Admit Date/Time:  06/17/2016  3:46 PM Admission Comments: No comment available   Primary Diagnosis:  Debilitated Principal Problem: Debilitated  Patient Active Problem List   Diagnosis Date Noted  . Debilitated 06/17/2016  . DVT of lower extremity, bilateral ([place]) 06/17/2016  . Sepsis due to cellulitis ([place]) 06/11/2016  . Aphasia 06/08/2013  . Slurred speech 06/08/2013  . AKI (acute kidney injury) ([place]) 06/08/2013  . CVA, old, hemiparesis ([place]) 06/08/2013  . HTN (hypertension) 06/08/2013    Expected Discharge Date: Expected Discharge Date: 06/26/16  Team Members Present: Physician leading conference: Dr. Alger Simons Social Worker Present: Alfonse Alpers, LCSW Nurse Present: Junius Creamer, RN PT Present: Canary Brim, Harriet Pho, PT OT Present: Willeen Cass, OT;Roanna Epley, COTA SLP Present: Gunnar Fusi, SLP PPS Coordinator present : Daiva Nakayama, RN, CRRN     Current Status/Progress Goal Weekly Team Focus  Medical   debility related to DVT's in LE's. previous left cva. ABLA, needs transfusion  improve HR control, decrease pain  rx anemia, pain. control anticoagulation/INR   Bowel/Bladder   Incontinent of bowel and bladder. Last BM 6/4.  Monitor for bowel and bladder function.  Toilet patient q3-4 hours prn.    Swallow/Nutrition/ Hydration             ADL's   bathing-mod A; UB dressing-min A; LB dressing-max A; functional transfers-mod A; limited activity tolerance  min A overall; max A toileting  funcitonal transfers; activity tolerance; BADL retraining; discharge planning    Mobility   minA bed mobility, modA transfers, max/totalA gait x5'  S bed mobility, minA transfers, maxA gait controlled environment x10' for NMR, strengthening  LLE strengthening, RLE NMR, bed mobility, transfer training, postural retraining   Communication             Safety/Cognition/ Behavioral Observations            Pain   C/o left leg pain. On percocet q4hrs prn.   Pain less than or equal to 2.   Assess for pain q shift and prn.    Skin   Skin is intact, no skin breakdown.  Monitor skin daily and prn.  No skin breakdown while in rehab.    Rehab Goals Patient on target to meet rehab goals: Yes Rehab Goals Revised: none *See Care Plan and progress notes for long and short-term goals.  Barriers to Discharge: prior right Hp, pain mgt, tachycardia    Possible Resolutions to Barriers:  medical mgt as above. family ed, adaptive equipment training    Discharge Planning/Teaching Needs:  Pt to return to her home with her mother to care for her as she was prior to admission.  Pt has DME needs.  Pt's mother will be offered family education.  She is willing to come in as long as she has transportation.   Team Discussion:  Pt with anemia, hemiparesis, has tachycardia, and is improving.  Pt wants to go home.  Pt had assistance PTA from her family.  Pt was incontinent at times at home and here.  Pt's pain is  improving.  Pt was min A PTA with ADLs and is mod A now with squat pivot txs with minimal assistance goals for ADLs, except toileing and ambulating.  Pt is aware of deficits, but does not always feel she needs to be here.  Participating better today.  Revisions to Treatment Plan:  none   Continued Need for Acute Rehabilitation Level of Care: The patient requires daily medical management by a physician with specialized training in physical medicine and rehabilitation for the following conditions: Daily direction of a multidisciplinary physical rehabilitation program to ensure safe  treatment while eliciting the highest outcome that is of practical value to the patient.: Yes Daily medical management of patient stability for increased activity during participation in an intensive rehabilitation regime.: Yes Daily analysis of laboratory values and/or radiology reports with any subsequent need for medication adjustment of medical intervention for : Neurological problems;Blood pressure problems;Other  [name], Silvestre Mesi 06/23/2016, 10:10 AM

## 2016-06-24 ENCOUNTER — Inpatient Hospital Stay (HOSPITAL_COMMUNITY): Payer: Medicare HMO | Admitting: Physical Therapy

## 2016-06-24 LAB — CBC
HEMATOCRIT: 28.1 % — AB (ref 36.0–46.0)
HEMOGLOBIN: 9.1 g/dL — AB (ref 12.0–15.0)
MCH: 29.7 pg (ref 26.0–34.0)
MCHC: 32.4 g/dL (ref 30.0–36.0)
MCV: 91.8 fL (ref 78.0–100.0)
Platelets: 383 10*3/uL (ref 150–400)
RBC: 3.06 MIL/uL — ABNORMAL LOW (ref 3.87–5.11)
RDW: 15.4 % (ref 11.5–15.5)
WBC: 10.3 10*3/uL (ref 4.0–10.5)

## 2016-06-24 LAB — PROTIME-INR
INR: 4.27 — AB
Prothrombin Time: 42.1 seconds — ABNORMAL HIGH (ref 11.4–15.2)

## 2016-06-24 NOTE — Progress Notes (Addendum)
Physical Therapy Session Note  Patient Details  Name: Ann Lynch MRN: [id number] Date of Birth: [birth year]  Today's Date: 06/24/2016 PT Individual Time: 1335-1443 PT Individual Time Calculation (min): 68 min   Short Term Goals: Week 1:  PT Short Term Goal 1 (Week 1): Pt will perform sit>stand with mod A.  PT Short Term Goal 2 (Week 1): Pt will perform bed mobility with min A.  PT Short Term Goal 3 (Week 1): Pt will propel manual w/c 150' with min A.  PT Short Term Goal 4 (Week 1): Pt will transfer bed<>w/c with mod A.  PT Short Term Goal 5 (Week 1): Pt will initiate gait training with max A.   Skilled Therapeutic Interventions/Progress Updates:  Pt received in w/c & agreeable to tx. Session focused on w/c mobility, transfers, and LE strengthening.  Pt propelled w/c room>gym with L Hemi technique, supervision, and more than reasonable amount of time. During session pt reported need to use bathroom x 2 times & was transported back to room via w/c total assist for urgency. Pt completes stand pivot w/c<>toilet with min assist for pivot portion and use of grab bar. Pt with continent BM on toilet and performed peri hygiene then hand hygiene at sink from w/c level with set up assist.  Pt requires total assist for management of brief and pants. In gym pt performed LLE strengthening exercises (long arc quads and hip flexion) with 3# weight with multimodal cuing for proper technique. Towards end of session RN administered meds and assessed BP:  Dinamap LUE: BP = 174/113 mmHg (per RN)  Manual LUE: BP = 150/100 mmHg (per RN) and 158/110 mmHg (per RN) RN administered BP medication & cleared pt for continued participation as long as pt was not symptomatic but session ended early 2/2 pt's stomach not feeling well (pt missed 7 minutes). Pt left in w/c with all needs within reach.   RN made aware of pt's frequent BM's during session.   Therapy Documentation Precautions:  Precautions Precautions:  Fall Precaution Comments: right hemiplegia Restrictions Weight Bearing Restrictions: No    Pain: No c/o pain noted, only stomach being upset.  See Function Navigator for Current Functional Status.   Therapy/Group: Individual Therapy  Waunita Schooner 06/24/2016, 3:41 PM

## 2016-06-24 NOTE — Progress Notes (Signed)
Occupational Therapy Session Note  Patient Details  Name: Ann Lynch MRN: [id number] Date of Birth: [birth year]  Today's Date: 06/24/2016 OT Individual Time: 1100-1145 OT Individual Time Calculation (min): 45 min    Short Term Goals: Week 1:  OT Short Term Goal 1 (Week 1): Pt will maintain static standing balance with MIN A in prep for clothing management OT Short Term Goal 2 (Week 1): Pt will don footwear with supervision OT Short Term Goal 3 (Week 1): Pt will perform LB bathing with min a and AE prn OT Short Term Goal 4 (Week 1): Pt will stand at sink for 2 grooming items to improve funcitonal endurance  Skilled Therapeutic Interventions/Progress Updates:    1:1 Pt reported her stomach was upset today and didn't know why. Focus on transfers to the toilet in the bathroom, sit to stands, standing balance during clothing management. Pt able to perform stand pivot transfers with grab bar with mod A. Pt requests A for clothing management. Transition to the gym and focus on stand pivot transfers with hemi walker with mod A and continued focus on standing balance with UE support. Pt reported not feeling well.  BP taken and it was elevated. 164/117 with HR 133.  RN notified and returned to room and requested to go the bathroom- with min to mod A squat pivot transfers with grab bar.  Returned to bed to lay down due to elevated BP.  Therapy Documentation Precautions:  Precautions Precautions: Fall Precaution Comments: right hemiplegia Restrictions Weight Bearing Restrictions: No General: General OT Amount of Missed Time: 15 Minutes due to BP  Pain: Pain Assessment Pain Assessment: No/denies pain Pain Score: 0-No pain  See Function Navigator for Current Functional Status.   Therapy/Group: Individual Therapy  Willeen Cass [facility] 06/24/2016, 2:27 PM

## 2016-06-24 NOTE — Progress Notes (Signed)
ANTICOAGULATION CONSULT NOTE - Follow Up Consult  Pharmacy Consult for coumadin Indication: DVT  No Known Allergies  Patient Measurements: Height: 5\' 7"  (170.2 cm) Weight: 177 lb 3.2 oz (80.4 kg) IBW/kg (Calculated) : 61.6 Heparin Dosing Weight:   Vital Signs: Temp: 98.4 F (36.9 C) (06/07 0531) Temp Source: Oral (06/07 0531) BP: 149/97 (06/07 0531) Pulse Rate: 87 (06/07 0531)  Labs:  Recent Labs  06/22/16 0558 06/23/16 0642 06/24/16 0551  HGB 9.0* 9.5* 9.1*  HCT 27.3* 29.3* 28.1*  PLT 313 383 383  LABPROT 31.6* 33.7* 42.1*  INR 2.98 3.23 4.27*  CREATININE  --  1.08*  --     Estimated Creatinine Clearance: 65.7 mL/min (A) (by C-G formula based on SCr of 1.08 mg/dL (H)).   Medications:  Scheduled:  . amitriptyline  25 mg Oral QHS  . aspirin EC  81 mg Oral Daily  . atorvastatin  40 mg Oral QHS  . cloNIDine  0.2 mg Oral TID  . DULoxetine  20 mg Oral QHS  . feeding supplement (ENSURE ENLIVE)  237 mL Oral BID BM  . folic acid  1 mg Oral Daily  . gabapentin  400 mg Oral TID  . metoprolol tartrate  100 mg Oral BID  . pantoprazole  40 mg Oral BID  . phenytoin  100 mg Oral TID  . polyethylene glycol  17 g Oral Daily  . senna-docusate  1 tablet Oral BID  . Warfarin - Pharmacist Dosing Inpatient   Does not apply q1800   Infusions:    Assessment: 56 yo female with DVT is currently on supratherapeutic coumadin probably due to the 4 mg received few days ago.  INR today is up to 4.27.  Goal of Therapy:  INR 2-3 Monitor platelets by anticoagulation protocol: Yes   Plan:  - no coumadin tonight - INR in am  Eugenio Dollins, Tsz-Yin 06/24/2016,8:21 AM

## 2016-06-24 NOTE — Progress Notes (Signed)
CRITICAL VALUE ALERT  Critical Value:  INR 4.27  Date & Time Notied:  06/24/16 0735  Provider Notified: Kendrick left  Orders Received/Actions taken: None at this time

## 2016-06-24 NOTE — Progress Notes (Signed)
Physical Therapy Session Note  Patient Details  Name: Ann Lynch MRN: [id number] Date of Birth: [birth year]  Today's Date: 06/24/2016 PT Individual Time: 800-900 PT Individual Time Calculation (min): 60 min   Short Term Goals: Week 1:  PT Short Term Goal 1 (Week 1): Pt will perform sit>stand with mod A.  PT Short Term Goal 2 (Week 1): Pt will perform bed mobility with min A.  PT Short Term Goal 3 (Week 1): Pt will propel manual w/c 150' with min A.  PT Short Term Goal 4 (Week 1): Pt will transfer bed<>w/c with mod A.  PT Short Term Goal 5 (Week 1): Pt will initiate gait training with max A.   Skilled Therapeutic Interventions/Progress Updates:  Pt received supine in bed using bed pan. Pt min A rolling R/L to donn clean brief and remove bed pan. Pt donned L sock mod I and mod A to don R sock. Pt min A supine>sit. S for upper body dressing of sports bra and pull over shirt seated on EOB. Pt max A to don shoes and pants but was able to thread L LE through pants; mod A for sit>stand and stand pivot transfer from bed to w/c. Pt set up assist to wash face and brush teeth at sink. Mod A sit>stand from manual w/c with no AD with focus on standing tolerance and upright posture; manual facilitation for R knee extension and hip extension. Pt expressed need to use the restroom. Mod A stand pivot transfer from w/c<>toilet with max A for pants and brief management. Pt set up assist for pt to wash hands at sink. Mod A sit>stand using hemi-walker with improved standing posture compared to no AD. Pt tolerates standing for no more than 1 min before requesting to sit due to fatigue. Pt left seated in w/c with all needs in reach.   Therapy Documentation Precautions:  Precautions Precautions: Fall Precaution Comments: right hemiplegia Restrictions Weight Bearing Restrictions: No   See Function Navigator for Current Functional Status.   Therapy/Group: Individual Therapy  Alysia Penna 06/24/2016,  3:39 PM

## 2016-06-24 NOTE — Progress Notes (Signed)
PHYSICAL MEDICINE & REHABILITATION     PROGRESS NOTE    Subjective/Complaints: Feeling ok. No new complaints.   ROS: pt denies nausea, vomiting, diarrhea, cough, shortness of breath or chest pain     Objective: Vital Signs: Blood pressure (!) 149/97, pulse 87, temperature 98.4 F (36.9 C), temperature source Oral, resp. rate 18, height 5\' 7"  (1.702 m), weight 80.4 kg (177 lb 3.2 oz), SpO2 96 %. No results found.  Recent Labs  06/23/16 0642 06/24/16 0551  WBC 11.6* 10.3  HGB 9.5* 9.1*  HCT 29.3* 28.1*  PLT 383 383    Recent Labs  06/23/16 0642  NA 136  K 4.1  CL 99*  GLUCOSE 106*  BUN 16  CREATININE 1.08*  CALCIUM 9.0   CBG (last 3)  No results for input(s): GLUCAP in the last 72 hours.  Wt Readings from Last 3 Encounters:  06/17/16 80.4 kg (177 lb 3.2 oz)  06/12/16 77.1 kg (170 lb)    Physical Exam:  Constitutional:  No acute distress Eyes:  Pupils reactive to light Neck: Normal range of motion. Neck supple. No thyromegalypresent.  Cardiovascular: RRR Respiratory:  CTA B GI: abd soft NT/ND Skin. warm Musculoskeletal: She exhibits edema.  Bilateral LE L>R tr edema present Neurological: She is alert.  Patient is oriented to person, place and time. She provides her date of birth. Fair awareness of deficits. Dense RUE hemiparesis. RLE 1-2/5 prox to distal with extensor pattern. Dysconjugate gaze. 1-2/4 tone RUE/RLE. 3beats clonus right ankle---neuro exam unchanged Psych: pt cooperative but flat  Assessment/Plan: 1. Functional deficits secondary to debility after bilateral LE DVT's which require 3+ hours per day of interdisciplinary therapy in a comprehensive inpatient rehab setting. Physiatrist is providing close team supervision and 24 hour management of active medical problems listed below. Physiatrist and rehab team continue to assess barriers to discharge/monitor patient progress toward functional and medical  goals.  Function:  Bathing Bathing position   Position: Wheelchair/chair at sink (she refused to wash lower body today and nurse tech had just cleansed periarea and changed brief with patient supinein bed just prior to OT session)  Bathing parts Body parts bathed by patient: Right arm, Left arm, Chest, Abdomen Body parts bathed by helper: Left arm, Back  Bathing assist Assist Level: Touching or steadying assistance(Pt > 75%)      Upper Body Dressing/Undressing Upper body dressing   What is the patient wearing?: Pull over shirt/dress, Bra Bra - Perfomed by patient: Thread/unthread right bra strap, Thread/unthread left bra strap Bra - Perfomed by helper: Hook/unhook bra (pull down sports bra) Pull over shirt/dress - Perfomed by patient: Thread/unthread right sleeve, Thread/unthread left sleeve, Put head through opening Pull over shirt/dress - Perfomed by helper: Pull shirt over trunk        Upper body assist Assist Level: Touching or steadying assistance(Pt > 75%)      Lower Body Dressing/Undressing Lower body dressing   What is the patient wearing?: Pants, Socks, Shoes     Pants- Performed by patient: Thread/unthread left pants leg Pants- Performed by helper: Thread/unthread right pants leg, Pull pants up/down   Non-skid slipper socks- Performed by helper: Don/doff right sock, Don/doff left sock Socks - Performed by patient: Don/doff right sock, Don/doff left sock Socks - Performed by helper: Don/doff right sock, Don/doff left sock   Shoes - Performed by helper: Don/doff right shoe, Don/doff left shoe, Fasten right, Fasten left       TED Hose - Performed by  helper: Don/doff right TED hose, Don/doff left TED hose  Lower body assist Assist for lower body dressing:  (max A)      Toileting Toileting Toileting activity did not occur: No continent bowel/bladder event Toileting steps completed by patient: Performs perineal hygiene Toileting steps completed by helper: Adjust  clothing prior to toileting, Adjust clothing after toileting Toileting Assistive Devices: Grab bar or rail  Toileting assist Assist level: Touching or steadying assistance (Pt.75%)   Transfers Chair/bed transfer   Chair/bed transfer method: Squat pivot, Stand pivot Chair/bed transfer assist level: Moderate assist (Pt 50 - 74%/lift or lower) Chair/bed transfer assistive device: Armrests     Locomotion Ambulation     Max distance: 67ft Assist level: Maximal assist (Pt 25 - 49%)   Wheelchair   Type: Manual Max wheelchair distance: 75 ft Assist Level: Supervision or verbal cues  Cognition Comprehension Comprehension assist level: Follows basic conversation/direction with extra time/assistive device  Expression Expression assist level: Expresses basic needs/ideas: With no assist  Social Interaction Social Interaction assist level: Interacts appropriately with others - No medications needed.  Problem Solving Problem solving assist level: Solves basic 90% of the time/requires cueing < 10% of the time  Memory Memory assist level: Recognizes or recalls 90% of the time/requires cueing < 10% of the time   Medical Problem List and Plan: 1. Debilitationsecondary to bilateral lower extremity DVTs with history of prior left CVA with right hemiparesis -continue therapies. She is anxious to get home   2. DVT Prophylaxis/Anticoagulation:  Coumadin.--INR up to 4.27 today  -coumadin mgt per pharmacy.   3. Pain Management: Neurontin 400 mg 3 times a day, Elavil 25 mg daily at bedtime, Cymbalta 20 mg daily Oxycodone as needed q4 prn 4. Mood: Provide emotional support 5. Neuropsych: This patient iscapable of making decisions on herown behalf. 6. Skin/Wound Care: Routine skin checks 7. Fluids/Electrolytes/Nutrition: encourage PO 8.Hypertension. Lopressor 100 mg twice a day,clonidine 0.2 mg twice a day. Monitor with increased mobility 9.Seizure disorder.Dilantin 100 mg  TID 10.AKI/hyponatremia.Renal ultrasound negative. Follow-up chemistries. 11.Leukocytosis. Latest WBC stable/decreased at 11k.  No clinical signs of infection.  -stopped augmentin given potential effect on INR    12. Constipation: resolved -continue with scheduled softener and laxative 13. ABLA: hgb 9.1 today  -INR supratherapeutic and increased today  -2u prbc 6/4  -stool OB +, second stool sample neg for OB  -no gross blood loss per rectum 14. HTN:    -  catapres 0.2mg  tid---no changes today   LOS (Days) 7 A FACE TO FACE EVALUATION WAS PERFORMED  [name], MD 06/24/2016 8:32 AM

## 2016-06-25 ENCOUNTER — Inpatient Hospital Stay (HOSPITAL_COMMUNITY): Payer: Medicare HMO | Admitting: Occupational Therapy

## 2016-06-25 ENCOUNTER — Ambulatory Visit (HOSPITAL_COMMUNITY): Payer: Medicare HMO | Admitting: Physical Therapy

## 2016-06-25 ENCOUNTER — Inpatient Hospital Stay (HOSPITAL_COMMUNITY): Payer: Medicare HMO

## 2016-06-25 LAB — CBC
HEMATOCRIT: 28.5 % — AB (ref 36.0–46.0)
HEMOGLOBIN: 9.1 g/dL — AB (ref 12.0–15.0)
MCH: 29.6 pg (ref 26.0–34.0)
MCHC: 31.9 g/dL (ref 30.0–36.0)
MCV: 92.8 fL (ref 78.0–100.0)
PLATELETS: 287 10*3/uL (ref 150–400)
RBC: 3.07 MIL/uL — AB (ref 3.87–5.11)
RDW: 15.4 % (ref 11.5–15.5)
WBC: 8.8 10*3/uL (ref 4.0–10.5)

## 2016-06-25 LAB — PROTIME-INR
INR: 3.57
PROTHROMBIN TIME: 36.6 s — AB (ref 11.4–15.2)

## 2016-06-25 MED ORDER — SENNOSIDES-DOCUSATE SODIUM 8.6-50 MG PO TABS: 1.0000 | ORAL_TABLET | Freq: Two times a day (BID) | ORAL | 1 refills | Status: AC

## 2016-06-25 MED ORDER — PANTOPRAZOLE SODIUM 40 MG PO TBEC: 40.0000 mg | DELAYED_RELEASE_TABLET | Freq: Two times a day (BID) | ORAL | 0 refills | Status: DC

## 2016-06-25 MED ORDER — PHENYTOIN SODIUM EXTENDED 100 MG PO CAPS: 100.0000 mg | ORAL_CAPSULE | Freq: Three times a day (TID) | ORAL | 0 refills | Status: AC

## 2016-06-25 MED ORDER — CLONIDINE HCL 0.2 MG PO TABS: 0.2000 mg | ORAL_TABLET | Freq: Three times a day (TID) | ORAL | 0 refills | Status: AC

## 2016-06-25 MED ORDER — DULOXETINE HCL 20 MG PO CPEP: 20.0000 mg | ORAL_CAPSULE | Freq: Every day | ORAL | 0 refills | Status: AC

## 2016-06-25 MED ORDER — METOPROLOL TARTRATE 100 MG PO TABS: 100.0000 mg | ORAL_TABLET | Freq: Two times a day (BID) | ORAL | 0 refills | Status: AC

## 2016-06-25 MED ORDER — POLYETHYLENE GLYCOL 3350 17 G PO PACK: 17.0000 g | PACK | Freq: Every day | ORAL | 0 refills | Status: AC

## 2016-06-25 MED ORDER — WARFARIN SODIUM 1 MG PO TABS
1.0000 mg | ORAL_TABLET | Freq: Once | ORAL | Status: AC
  Administered 2016-06-25: 1 mg via ORAL
  Filled 2016-06-25: qty 1

## 2016-06-25 MED ORDER — OXYCODONE-ACETAMINOPHEN 5-325 MG PO TABS: 0.5000 | ORAL_TABLET | Freq: Two times a day (BID) | ORAL | 0 refills | Status: DC | PRN

## 2016-06-25 MED ORDER — FOLIC ACID 1 MG PO TABS: 1.0000 mg | ORAL_TABLET | Freq: Every day | ORAL | 0 refills | Status: AC

## 2016-06-25 MED ORDER — AMITRIPTYLINE HCL 25 MG PO TABS: 25.0000 mg | ORAL_TABLET | Freq: Every day | ORAL | 0 refills | Status: AC

## 2016-06-25 MED ORDER — GABAPENTIN 400 MG PO CAPS: 400.0000 mg | ORAL_CAPSULE | Freq: Three times a day (TID) | ORAL | 0 refills | Status: AC

## 2016-06-25 MED ORDER — WARFARIN SODIUM 2 MG PO TABS: 1.0000 mg | ORAL_TABLET | Freq: Every day | ORAL | 0 refills | Status: DC

## 2016-06-25 MED ORDER — ATORVASTATIN CALCIUM 40 MG PO TABS: 40.0000 mg | ORAL_TABLET | Freq: Every day | ORAL | 0 refills | Status: AC

## 2016-06-25 NOTE — Progress Notes (Signed)
Occupational Therapy Discharge Summary  Patient Details  Name: Ann Lynch MRN: [id number] Date of Birth: Apr 04, 1960   Patient has met 7 of 9 long term goals due to improved activity tolerance, improved balance, postural control, improved awareness and improved coordination.  Patient to discharge at overall Mod Assist level.  Patient's care partner is independent to provide the necessary physical and cognitive assistance at discharge.  Pt required physical assistance prior to admission. Her family attended family education session and has demonstrates willing and ableness to provide needed physical and cognitive assist at d/Lynch.   Reasons goals not met: Pt requires mod-max A with LB dressing due to impaired functional standing balance and coordination. She requires mod A for tub/shower transfer for assist in managing R LE over tub wall. Pt's family aware and are able to provide needed assist.   Recommendation:  Patient will benefit from ongoing skilled OT services in home health setting to continue to advance functional skills in the area of BADL and Reduce care partner burden.  Equipment: Ordered pt new DME including hospital bed and BSC, had all DME prior to admission  Reasons for discharge: treatment goals met and discharge from hospital  Patient/family agrees with progress made and goals achieved: Yes  OT Discharge Precautions/Restrictions  Precautions Precautions: Fall Precaution Comments: R hemiplegia Restrictions Weight Bearing Restrictions: No General   Vital Signs  Pain Pain Assessment Pain Assessment: Faces Faces Pain Scale: No hurt Vision Baseline Vision/History: Wears glasses Wears Glasses: Reading only Patient Visual Report: No change from baseline Perception  Perception: Impaired (At baseline) Praxis Praxis: Intact Cognition Overall Cognitive Status: History of cognitive impairments - at baseline Arousal/Alertness: Awake/alert Orientation Level:  Oriented X4 Memory: Impaired Awareness: Impaired Awareness Impairment: Emergent impairment Problem Solving: Impaired Problem Solving Impairment: Verbal basic;Functional basic Safety/Judgment: Appears intact Sensation Sensation Light Touch: Impaired Detail Light Touch Impaired Details: Impaired RLE;Impaired RUE (Due to hx R CVA 2009) Coordination Gross Motor Movements are Fluid and Coordinated: No Fine Motor Movements are Fluid and Coordinated: No Coordination and Movement Description: hx R hemiplegia Motor  Motor Motor: Hemiplegia Motor - Discharge Observations: Baseline R hemiplegia Mobility  Bed Mobility Rolling Right: 4: Min assist Rolling Right Details: Manual facilitation for weight shifting Rolling Left: 5: Supervision Rolling Left Details: Manual facilitation for weight shifting Supine to Sit: 4: Min assist Supine to Sit Details: Manual facilitation for placement;Manual facilitation for weight shifting;Tactile cues for posture;Tactile cues for weight shifting Sit to Supine: 4: Min assist Sit to Supine - Details: Tactile cues for weight shifting;Tactile cues for posture;Manual facilitation for weight shifting;Manual facilitation for placement Transfers Sit to Stand: 4: Min assist Sit to Stand Details: Verbal cues for sequencing;Verbal cues for precautions/safety;Manual facilitation for weight shifting;Manual facilitation for placement;Tactile cues for posture;Tactile cues for placement  Trunk/Postural Assessment  Cervical Assessment Cervical Assessment: Exceptions to [facility] (Forward head) Thoracic Assessment Thoracic Assessment: Exceptions to [facility] (Kyphotic) Lumbar Assessment Lumbar Assessment: Exceptions to [facility] (Posterior pelvic tilt) Postural Control Postural Control: Within Functional Limits  Balance Balance Balance Assessed: Yes Dynamic Sitting Balance Dynamic Sitting - Level of Assistance: 6: Modified independent (Device/Increase time) Static Standing  Balance Static Standing - Balance Support: Left upper extremity supported Static Standing - Level of Assistance: 4: Min assist;5: Stand by assistance Static Standing - Comment/# of Minutes: During LB dressing/ toileting tasks Dynamic Standing Balance Dynamic Standing - Balance Support: Left upper extremity supported Dynamic Standing - Level of Assistance: 4: Min assist;3: Mod assist Extremity/Trunk Assessment RUE Assessment RUE Assessment:  Exceptions to [facility] (Flaccid hemiplegia) LUE Assessment LUE Assessment: Within Functional Limits   See Function Navigator for Current Functional Status.  Ann Lynch, Ann Lynch 06/25/2016, 4:38 PM

## 2016-06-25 NOTE — Progress Notes (Addendum)
Social Work  Discharge Note  The overall goal for the admission was met for:   Discharge location: Yes - home with mother  Length of Stay: Yes - 9 days (with discharge on 06/26/16)  Discharge activity level: Yes - min to max assist overall  Home/community participation: Yes -   Services provided included: MD, RD, PT, OT, RN, TR, Pharmacy and [place] [employer]  Follow-up services arranged: Home Health: RN, PT, OT via [facility], DME: transport w/c, hospital bed, hemi walker, tub bench and drop arm commode via [facility] and Patient/Family has no preference for HH/DME agencies  Comments (or additional information):  Patient/Family verbalized understanding of follow-up arrangements: Yes  Individual responsible for coordination of the follow-up plan: pt/mom  Confirmed correct DME delivered: Ann Lynch 06/25/2016    [name]

## 2016-06-25 NOTE — Progress Notes (Signed)
Occupational Therapy Session Note  Patient Details  Name: Ann Lynch MRN: [id number] Date of Birth: [birth year]  Today's Date: 06/25/2016 OT Individual Time: 1303-1405 OT Individual Time Calculation (min): 62 min    Short Term Goals: Week 1:  OT Short Term Goal 1 (Week 1): Pt will maintain static standing balance with MIN A in prep for clothing management OT Short Term Goal 2 (Week 1): Pt will don footwear with supervision OT Short Term Goal 3 (Week 1): Pt will perform LB bathing with min a and AE prn OT Short Term Goal 4 (Week 1): Pt will stand at sink for 2 grooming items to improve funcitonal endurance Week 2:    Week 3:     Skilled Therapeutic Interventions/Progress Updates:    Pt greeted in w/c. Mother and sister present for family education. Collaborated with family in regards to safe bathroom transfer completion at home. Discussed w/c and DME positioning in their small bathroom (so small w/c scrapes walls). Per family, pt generally pulls up on sink, which is now dislodging from the wall. Practiced stand pivot transfers with hemi walker to increase safety with family understanding that pt would use this method at home instead of sink. Mother/sister demonstrated carryover of education from OT during simulated tub bench/toilet transfers in therapy apartment. Discussed additional bathroom/caregiving  modifications to maximize safety.Went over DME needs (definitely tub bench, as family reports their shower seat is inclined and pt slides off of it. Per mother: "it's a mess!"). They also need hemi walker and BSC. SW made aware. Pt then reported needing to void and preferred to return to her room. Min cues for safety reinforcement provided by OT when family provided assist during toileting. Pt able to stand with hemi walker while sister completed hygiene. Education emphasis on using hemi walker for UE support instead of grab bar (because she won't have these at home). They verbalized  understanding.  Pt left with family at time of departure. No further questions regarding d/c.   Therapy Documentation Precautions:  Precautions Precautions: Fall Precaution Comments: R hemiplegia Restrictions Weight Bearing Restrictions: No   Vital Signs: Therapy Vitals Temp: 97.7 F (36.5 C) Temp Source: Oral Pulse Rate: 96 Resp: 18 BP: (!) 171/110 Patient Position (if appropriate): Sitting Oxygen Therapy SpO2: 99 % O2 Device: Not Delivered Pain: Pain Assessment Pain Assessment: No/denies pain ADL:   Vision Baseline Vision/History: Wears glasses Wears Glasses: Reading only Patient Visual Report: No change from baseline   Praxis Praxis: Intact    See Function Navigator for Current Functional Status.   Therapy/Group: Individual Therapy  [name] A [name] 06/25/2016, 4:00 PM

## 2016-06-25 NOTE — Progress Notes (Signed)
Occupational Therapy Session Note  Patient Details  Name: Ann Lynch MRN: [id number] Date of Birth: [birth year]  Today's Date: 06/25/2016 OT Individual Time: 1000-1100 OT Individual Time Calculation (min): 60 min    Short Term Goals: Week 1:  OT Short Term Goal 1 (Week 1): Pt will maintain static standing balance with MIN A in prep for clothing management OT Short Term Goal 2 (Week 1): Pt will don footwear with supervision OT Short Term Goal 3 (Week 1): Pt will perform LB bathing with min a and AE prn OT Short Term Goal 4 (Week 1): Pt will stand at sink for 2 grooming items to improve funcitonal endurance  Skilled Therapeutic Interventions/Progress Updates:    Pt seen for OT ADL bathing/dressing session. Session scheduled to be family education, however, pt reported upon arrival with family had gone to cafeteria for breakfast and return later. Family returned at very end of OT session, encouraged family to stay for 1:00 OT session for hands on family education.  Pt completed min-mod A squat/stand pivot transfers throughout session. Completed showering activity in ADL apartment utilizing tub/shower combination and tub transfer bench. She bathed with supervision and assist to wash R UE. VCs for encouragement of independencewith ADL tasks. She dressed seated in w/c, able to dress using modified techniques and supervision. Stood at counter with min A while therapist completed clothing management total A.  Pt left in tx gym with hand off to PT with pt's family present.   Therapy Documentation Precautions:  Precautions Precautions: Fall Precaution Comments: right hemiplegia Restrictions Weight Bearing Restrictions: No Pain:   No/ denies pain  See Function Navigator for Current Functional Status.   Therapy/Group: Individual Therapy  Lewis, Chancellor Vanderloop C 06/25/2016, 7:17 AM

## 2016-06-25 NOTE — Plan of Care (Signed)
Problem: RH Balance Goal: LTG Patient will maintain dynamic standing balance (PT) LTG:  Patient will maintain dynamic standing balance with assistance during mobility activities (PT)  Outcome: Not Met (add Reason) Treatment not focused on dynamic standing balance.   Problem: RH Bed Mobility Goal: LTG Patient will perform bed mobility with assist (PT) LTG: Patient will perform bed mobility with assistance, with/without cues (PT).  Outcome: Not Met (add Reason) Pt requires min A for R rolling and supine<>sit.

## 2016-06-25 NOTE — Plan of Care (Signed)
Problem: RH Dressing Goal: LTG Patient will perform lower body dressing w/assist (OT) LTG: Patient will perform lower body dressing with assist, with/without cues in positioning using equipment (OT)  Outcome: Not Met (add Reason) Pt requires mod-max A dressing  Problem: RH Tub/Shower Transfers Goal: LTG Patient will perform tub/shower transfers w/assist (OT) LTG: Patient will perform tub/shower transfers with assist, with/without cues using equipment (OT)  Outcome: Not Met (add Reason) Pt requires mod A for assistance managing R LE over tub wall when utilizing tub transfer bench

## 2016-06-25 NOTE — Discharge Instructions (Signed)
Inpatient Rehab Discharge Instructions  Ann Lynch Discharge date and time: 06/25/16   Activities/Precautions/ Functional Status: Activity: activity as tolerated Diet: regular diet Wound Care: none needed   Functional status:  ___ No restrictions     ___ Walk up steps independently _X__ 24/7 supervision/assistance   ___ Walk up steps with assistance ___ Intermittent supervision/assistance  ___ Bathe/dress independently ___ Walk with walker     _x__ Bathe/dress with assistance ___ Walk Independently    ___ Shower independently _X__ Walk with assistance    ___ Shower with assistance _X__ No alcohol     ___ Return to work/school ________   COMMUNITY REFERRALS UPON DISCHARGE:    Home Health:   PT     OT      RN  Agency:  [facility] Phone:  [phone]  Medical Equipment/Items Ordered:  Hospital bed; drop arm commode, transport wheelchair, hemi walker and tub transfer bench   Agency/Supplier:  [facility]      Phone:  [phone]   Special Instructions: 1. Home health nurse to check INR on 06/28/2016 results to Dr. Charolette Forward phone number [phone] fax number [phone]    Opioid Overdose Opioids are substances that relieve pain by binding to pain receptors in your brain and spinal cord. Opioids include illegal drugs, such as heroin, as well as prescription pain medicines.An opioid overdose happens when you take too much of an opioid substance. This can happen with any type of opioid, including:  Heroin.  Morphine.  Codeine.  Methadone.  Oxycodone.  Hydrocodone.  Fentanyl.  Hydromorphone.  Buprenorphine.  The effects of an overdose can be mild, dangerous, or even deadly. Opioid overdose is a medical emergency. What are the causes? This condition may be caused by:  Taking too much of an opioid by accident.  Taking too much of an opioid on purpose.  An error made by a health care provider who prescribes a medicine.  An error  made by the pharmacist who fills the prescription order.  Using more than one substance that contains opioids at the same time.  Mixing an opioid with a substance that affects your heart, breathing, or blood pressure. These include alcohol, tranquilizers, sleeping pills, illegal drugs, and some over-the-counter medicines.  What increases the risk? This condition is more likely in:  Children. They may be attracted to colorful pills. Because of a child's small size, even a small amount of a drug can be dangerous.  Elderly people. They may be taking many different drugs. Elderly people may have difficulty reading labels or remembering when they last took their medicine.  People who take an opioid on a long-term basis.  People who use: ? Illegal drugs. ? Other substances, including alcohol, while using an opioid.  People who have: ? A history of drug or alcohol abuse. ? Certain mental health conditions.  People who take opioids that are not prescribed for them.  What are the signs or symptoms? Symptoms of this condition depend on the type of opioid and the amount that was taken. Common symptoms include:  Sleepiness or difficulty waking from sleep.  Confusion.  Slurred speech.  Slowed breathing and a slow pulse.  Nausea and vomiting.  Abnormally small pupils.  Signs and symptoms that require emergency treatment include:  Cold, clammy, and pale skin.  Blue lips and fingernails.  Vomiting.  Gurgling sounds in the throat.  A pulse that is very slow or difficult to detect.  Breathing that is very  slow, noisy, or difficult to detect.  Limp body.  Inability to respond to speech or be awakened from sleep (stupor).  How is this diagnosed? This condition is diagnosed based on your symptoms. It is important to tell your health care provider:  All of the opioidsthat you took.  When you took the opioids.  Whether you were drinking alcohol or using other  substances.  Your health care provider will do a physical exam. This exam may include:  Checking and monitoring your heart rate and rhythm, your breathing rate and depth, your temperature, and your blood pressure (vital signs).  Checking for abnormally small pupils.  Measuring oxygen levels in your blood.  You may also have blood tests or urine tests. How is this treated? Supporting your vital signs and your breathing is the first step in treating an opioid overdose. Treatment may also include:  Giving fluids and minerals (electrolytes) through an IV tube.  Inserting a breathing tube (endotracheal tube) in your airway to help you breathe.  Giving oxygen.  Passing a tube through your nose and into your stomach (NG tube, or nasogastric tube) to wash out your stomach.  Giving medicines that: ? Increase your blood pressure. ? Absorb any opioid that is in your digestive system. ? Reverse the effects of the opioid (naloxone).  Ongoing counseling and mental health support if you intentionally overdosed or used an illegal drug.  Follow these instructions at home:  Take over-the-counter and prescription medicines only as told by your health care provider. Always ask your health care provider about possible side effects and interactions of any new medicine that you start taking.  Keep a list of all of the medicines that you take, including over-the-counter medicines. Bring this list with you to all of your medical visits.  Drink enough fluid to keep your urine clear or pale yellow.  Keep all follow-up visits as told by your health care provider. This is important. How is this prevented?  Get help if you are struggling with: ? Alcohol or drug use. ? Depression or another mental health problem.  Keep the phone number of your local poison control center near your phone or on your cell phone.  Store all medicines in safety containers that are out of the reach of children.  Read the  drug inserts that come with your medicines.  Do not drink alcohol when taking opioids.  Do not use illegal drugs.  Do not take opioid medicines that are not prescribed for you. Contact a health care provider if:  Your symptoms return.  You develop new symptoms or side effects when you are taking medicines. Get help right away if:  You think that you or someone else may have taken too much of an opioid. The hotline of the [facility] is 508-735-6637.  You or someone else is having symptoms of an opioid overdose.  You have serious thoughts about hurting yourself or others.  You have: ? Chest pain. ? Difficulty breathing. ? A loss of consciousness. Opioid overdose is an emergency. Do not wait to see if the symptoms will go away. Get medical help right away. Call your local emergency services (911 in the [place] Do not drive yourself to the hospital. This information is not intended to replace advice given to you by your health care provider. Make sure you discuss any questions you have with your health care provider. Document Released: 02/12/2004 Document Revised: 06/12/2015 Document Reviewed: 06/20/2014 Elsevier Interactive Patient Education  2018 [place].   My questions have been answered and I understand these instructions. I will adhere to these goals and the provided educational materials after my discharge from the hospital.  Patient/Caregiver Signature _______________________________ Date __________  Clinician Signature _______________________________________ Date __________  Please bring this form and your medication list with you to all your follow-up doctor's appointments.

## 2016-06-25 NOTE — Progress Notes (Signed)
Rx written for Oxycodone 5 mg # 15 pills -- 1/2 to 1 pill every 12 hours prn pain

## 2016-06-25 NOTE — Discharge Summary (Signed)
Ann, Lynch NO.:  [id number]  MEDICAL RECORD NO.:  [id number]  LOCATION:                                 FACILITY:  PHYSICIAN:  [name], M.D.DATE OF BIRTH:  [birth year]  DATE OF ADMISSION:  06/17/2016 DATE OF DISCHARGE:  06/27/2016                              DISCHARGE SUMMARY   DISCHARGE DIAGNOSES: 1. Debilitation secondary to bilateral lower extremity deep vein     thrombosis with history of prior left cerebrovascular accident. 2. Coumadin for deep vein thrombosis. 3. Pain management. 4. Hypertension. 5. Seizure disorder. 6. Acute kidney injury, resolved. 7. 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  Lives with elderly mother.  Presented Jun 11, 2016, with left lower extremity pain after a recent fall.  CT of the head negative for acute changes.  CT of the pelvis showed suspicion of left pelvic and femoral vein DVT.  Bilateral lower extremity Doppler showed left positive acute DVT, common femoral, femoral, and popliteal veins, also superficial thrombosis of the saphenofemoral junction and the proximal greater saphenous vein.  Right positive DVT, iliac, common femoral, femoral, and popliteal veins.  Placed on Lovenox, Coumadin therapy.  Findings of elevated creatinine 5.22 on admission, sodium 130. Renal ultrasound negative.  Latest renal function 2015 within normal limits.  Vascular Surgery consulted for followup bilateral DVTs.  Not a candidate for thrombolysis due to renal insufficiency.  Placed on gentle IV fluids.  Renal function improved to 1.43.  The patient spiked a low- grade fever related to DVTs, WBC 11,700.  Chest x-ray showed atelectasis versus pneumonia at the bases, greater on the left.  CT of the chest showed small right, trace left dependent pleural effusions. Consolidation and  volume loss.  Placed on Augmentin for empiric coverage.  The patient was admitted for comprehensive rehab program.  PAST MEDICAL HISTORY:  See discharge diagnoses.  SOCIAL HISTORY:  Lives with mother.  Essentially wheelchair bound.  She withstand pivot transfer with assistance.  FUNCTIONAL STATUS UPON ADMISSION TO REHAB SERVICES:  Minimal assist squat pivot transfers, moderate assist sit to stand, mod-to-max assist activities of daily living.  PHYSICAL EXAMINATION:  VITAL SIGNS:  Blood pressure 133/88, pulse 103, temperature 99, respirations 18. GENERAL:  This was an alert female, appearing older than stated age. HEENT:  Pupils reactive to light. CARDIAC:  Regular rate and rhythm. ABDOMEN:  Soft, nontender.  Good bowel sounds. LUNGS:  Clear to auscultation without wheeze. NEUROLOGIC:  Dense right-sided weakness.  REHABILITATION HOSPITAL COURSE:  The patient was admitted to inpatient rehab services with therapies initiated on a 3-hour daily basis, consisting of physical therapy, occupational therapy, and rehabilitation nursing.  The following issues were addressed during the patient's rehabilitation stay.  Pertaining to Ms. Guy-Dozier's debilitation related DVTs, noted history of CVA in the past with dense right-sided weakness.  She remained on chronic Coumadin therapy for DVTs.  Latest INR of 3.37.  She was to be followed by Dr. Terrence Dupont on discharge with home health nurse provided.  No bleeding episodes noted.  Blood pressures controlled on Lopressor as well as clonidine.  She  continued on Dilantin for history of seizure disorder.  No seizure activity noted. Renal function remained stable with latest creatinine of 1.08. A urinalysis study was completed prior which showed positive nitrite cultures and sensitivities pending placed on empiric treatment.  The patient received weekly collaborative interdisciplinary team conferences to discuss estimated length of stay, family teaching,  any barriers to her discharge.  She could don her socks, modified independent on the left, moderate assist for the right due to hemiplegia, minimal assist supine to sit, supervision for upper body dressing, max assist to don her shoes, moderate assist sit to stand from manual wheelchair without assistive device.  She could easily direct her care.  She needed assistance for ADLs due to her hemiplegia after history of CVA.  Stand pivot transfers with a hemi walker in the rehab gym with moderate assist focusing on standing balance.  She was tolerating a regular consistency diet.  Full family teaching was completed and discharged to home.  DISCHARGE MEDICATIONS:  Elavil 25 mg p.o. at bedtime, aspirin 81 mg p.o. daily, Lipitor 40 mg p.o. bedtime, clonidine 0.2 mg t.i.d., Cymbalta 20 mg at bedtime, folic acid 1 mg daily, Neurontin 400 mg p.o. t.i.d., Lopressor 100 mg b.i.d., Protonix 40 mg b.i.d., Dilantin 100 mg p.o. t.i.d., MiraLAX daily, hold for loose stools, Coumadin daily adjusted accordingly for INR of 2.0 to 3.0 with a discharge dose of 0.5 mg daily.  DISCHARGE INSTRUCTIONS:  Her diet was regular.  She would follow up with Dr. Alger Simons at the [facility] as directed; Dr. Terrence Dupont, Medical Management.  A home health nurse has been arranged to draw INR on June 30, 2016, results to Dr. Terrence Dupont, phone [phone], fax [phone].     Lauraine Rinne, P.A.   ______________________________ [name], M.D.    DA/MEDQ  D:  06/25/2016  T:  06/25/2016  Job:  [id number]  cc:   Allegra Lai. Terrence Dupont, M.D. [name], M.D.

## 2016-06-25 NOTE — Progress Notes (Signed)
ANTICOAGULATION CONSULT NOTE - Follow Up Consult  Pharmacy Consult for coumadin Indication: DVT  No Known Allergies  Patient Measurements: Height: 5\' 7"  (170.2 cm) Weight: 177 lb 3.2 oz (80.4 kg) IBW/kg (Calculated) : 61.6 Heparin Dosing Weight:   Vital Signs: Temp: 98.9 F (37.2 C) (06/08 0617) Temp Source: Oral (06/08 0617) BP: 151/92 (06/08 0617) Pulse Rate: 95 (06/08 0617)  Labs:  Recent Labs  06/23/16 1438 06/24/16 0551 06/25/16 0611  HGB 9.5* 9.1* 9.1*  HCT 29.3* 28.1* 28.5*  PLT 383 383 287  LABPROT 33.7* 42.1* 36.6*  INR 3.23 4.27* 3.57  CREATININE 1.08*  --   --     Estimated Creatinine Clearance: 65.7 mL/min (A) (by C-G formula based on SCr of 1.08 mg/dL (H)).   Medications:  Scheduled:  . amitriptyline  25 mg Oral QHS  . aspirin EC  81 mg Oral Daily  . atorvastatin  40 mg Oral QHS  . cloNIDine  0.2 mg Oral TID  . DULoxetine  20 mg Oral QHS  . feeding supplement (ENSURE ENLIVE)  237 mL Oral BID BM  . folic acid  1 mg Oral Daily  . gabapentin  400 mg Oral TID  . metoprolol tartrate  100 mg Oral BID  . pantoprazole  40 mg Oral BID  . phenytoin  100 mg Oral TID  . polyethylene glycol  17 g Oral Daily  . senna-docusate  1 tablet Oral BID  . Warfarin - Pharmacist Dosing Inpatient   Does not apply q1800   Infusions:    Assessment: 57 yo female with DVT is currently on supratherapeutic coumadin but INR is coming down from 4.27 to 3.57.  Goal of Therapy:  INR 2-3 Monitor platelets by anticoagulation protocol: Yes   Plan:  - coumadin 1mg  po x1 - INR in am  Zakira Ressel, Tsz-Yin 06/25/2016,8:23 AM

## 2016-06-25 NOTE — Discharge Summary (Signed)
Discharge summary job # [id number]

## 2016-06-25 NOTE — Progress Notes (Signed)
[facility]     PROGRESS NOTE    Subjective/Complaints: Denies pain. Anxious to get home  ROS: pt denies nausea, vomiting, diarrhea, cough, shortness of breath or chest pain     Objective: Vital Signs: Blood pressure (!) 151/92, pulse 95, temperature 98.9 F (37.2 C), temperature source Oral, resp. rate 18, height 5\' 7"  (1.702 m), weight 80.4 kg (177 lb 3.2 oz), SpO2 95 %. No results found.  Recent Labs  06/24/16 0551 06/25/16 0611  WBC 10.3 8.8  HGB 9.1* 9.1*  HCT 28.1* 28.5*  PLT 383 287    Recent Labs  06/23/16 0642  NA 136  K 4.1  CL 99*  GLUCOSE 106*  BUN 16  CREATININE 1.08*  CALCIUM 9.0   CBG (last 3)  No results for input(s): GLUCAP in the last 72 hours.  Wt Readings from Last 3 Encounters:  06/17/16 80.4 kg (177 lb 3.2 oz)  06/12/16 77.1 kg (170 lb)    Physical Exam:  Constitutional:  No acute distress Eyes:  Pupils reactive to light Neck: Normal range of motion. Neck supple. No thyromegalypresent.  Cardiovascular: RRR Respiratory:  CTA B GI: soft nt Skin. warm Musculoskeletal: .  Bilateral LE L>R tr edema present--no change Neurological: She is alert.  Patient is oriented to person, place and time. She provides her date of birth. Fair awareness of deficits. Dense RUE hemiparesis. RLE 1-2/5 prox to distal with extensor pattern. Dysconjugate gaze. 1-2/4 tone RUE/RLE. 3beats clonus right ankle--stable Psych: pt cooperative but flat  Assessment/Plan: 1. Functional deficits secondary to debility after bilateral LE DVT's which require 3+ hours per day of interdisciplinary therapy in a comprehensive inpatient rehab setting. Physiatrist is providing close team supervision and 24 hour management of active medical problems listed below. Physiatrist and rehab team continue to assess barriers to discharge/monitor patient progress toward functional and medical goals.  Function:  Bathing Bathing position    Position: Wheelchair/chair at sink (she refused to wash lower body today and nurse tech had just cleansed periarea and changed brief with patient supinein bed just prior to OT session)  Bathing parts Body parts bathed by patient: Right arm, Left arm, Chest, Abdomen Body parts bathed by helper: Left arm, Back  Bathing assist Assist Level: Touching or steadying assistance(Pt > 75%)      Upper Body Dressing/Undressing Upper body dressing   What is the patient wearing?: Pull over shirt/dress, Bra Bra - Perfomed by patient: Thread/unthread right bra strap, Thread/unthread left bra strap, Hook/unhook bra (pull down sports bra) Bra - Perfomed by helper: Hook/unhook bra (pull down sports bra) Pull over shirt/dress - Perfomed by patient: Thread/unthread right sleeve, Thread/unthread left sleeve, Put head through opening, Pull shirt over trunk Pull over shirt/dress - Perfomed by helper: Pull shirt over trunk        Upper body assist Assist Level: Supervision or verbal cues      Lower Body Dressing/Undressing Lower body dressing   What is the patient wearing?: Pants, Shoes, Socks     Pants- Performed by patient: Thread/unthread left pants leg Pants- Performed by helper: Thread/unthread right pants leg, Pull pants up/down   Non-skid slipper socks- Performed by helper: Don/doff right sock, Don/doff left sock Socks - Performed by patient: Don/doff left sock Socks - Performed by helper: Don/doff right sock Shoes - Performed by patient: Don/doff left shoe Shoes - Performed by helper: Don/doff right shoe, Fasten right, Fasten left       TED Hose - Performed  by helper: Don/doff right TED hose, Don/doff left TED hose  Lower body assist Assist for lower body dressing:  (max A )      Toileting Toileting Toileting activity did not occur: No continent bowel/bladder event Toileting steps completed by patient: Performs perineal hygiene Toileting steps completed by helper: Adjust clothing prior to  toileting, Adjust clothing after toileting Toileting Assistive Devices: Grab bar or rail  Toileting assist Assist level: Touching or steadying assistance (Pt.75%)   Transfers Chair/bed transfer   Chair/bed transfer method: Stand pivot Chair/bed transfer assist level: Moderate assist (Pt 50 - 74%/lift or lower) Chair/bed transfer assistive device: Armrests     Locomotion Ambulation     Max distance: 69ft Assist level: Maximal assist (Pt 25 - 49%)   Wheelchair   Type: Manual Max wheelchair distance: 150 ft Assist Level: Supervision or verbal cues  Cognition Comprehension Comprehension assist level: Follows basic conversation/direction with extra time/assistive device  Expression Expression assist level: Expresses basic needs/ideas: With no assist  Social Interaction Social Interaction assist level: Interacts appropriately with others - No medications needed.  Problem Solving Problem solving assist level: Solves basic 90% of the time/requires cueing < 10% of the time  Memory Memory assist level: Recognizes or recalls 90% of the time/requires cueing < 10% of the time   Medical Problem List and Plan: 1. Debilitationsecondary to bilateral lower extremity DVTs with history of prior left CVA with right hemiparesis -dc home tomorrow  -Patient to see MD in the office for transitional care encounter in 1-2 weeks.  -home health services---will need INR/CBC followed by [facility]   2. DVT Prophylaxis/Anticoagulation:  Coumadin.--INR down to 3.23 today  -coumadin mgt per pharmacy.   3. Pain Management: Neurontin 400 mg 3 times a day, Elavil 25 mg daily at bedtime, Cymbalta 20 mg daily Oxycodone as needed q4 prn 4. Mood: Provide emotional support 5. Neuropsych: This patient iscapable of making decisions on herown behalf. 6. Skin/Wound Care: Routine skin checks 7. Fluids/Electrolytes/Nutrition: encourage PO 8.Hypertension. Lopressor 100 mg twice a day,clonidine 0.2 mg twice a day.  Monitor with increased mobility 9.Seizure disorder.Dilantin 100 mg TID 10.AKI/hyponatremia.Renal ultrasound negative. Follow-up chemistries have been stable 11.Leukocytosis. Latest WBC stable/decreased to 8.8k today.  No clinical signs of infection.  -stopped augmentin given potential effect on INR    12. Constipation: resolved -continue with scheduled softener and laxative 13. ABLA: hgb again 9.1 today 6/8  -INR supratherapeutic but down to 3.23  -2u prbc 6/4  -stool OB +, second stool sample neg for OB  -no gross blood loss per rectum  -will need follow up of hgb as outpt while on coumadin 14. HTN:    -  catapres 0.2mg  tid---no changes today   LOS (Days) 8 A FACE TO FACE EVALUATION WAS PERFORMED  [name], MD 06/25/2016 8:22 AM

## 2016-06-25 NOTE — Progress Notes (Signed)
Physical Therapy Discharge Summary  Patient Details  Name: Ann Lynch MRN: [id number] Date of Birth: [birth year]  Today's Date: 06/25/2016 PT Individual Time:  1100-1200 PT Individual Time Calculation: 60 min    Patient has met 7 of 9 long term goals due to improved activity tolerance, improved balance, improved postural control, increased strength, decreased pain and ability to compensate for deficits.  Patient to discharge at a wheelchair level [place].  Patient's care partner is independent to provide the necessary physical assistance at discharge.  Reasons goals not met: Pt requires min A with bed mobility during R rolling and supine>sit. Treatment not focused on dynamic standing balance.   Recommendation:  Patient will benefit from ongoing skilled PT services in home health setting to continue to advance safe functional mobility, address ongoing impairments in strength, endurance, functional mobility, and minimize fall risk.  Equipment: hospital bed, transport chair, BSC, hemi-walker  Reasons for discharge: treatment goals met  Patient/family agrees with progress made and goals achieved: Yes  PT Discharge Precautions/Restrictions Precautions Precautions: Fall Precaution Comments: R hemiplegia Restrictions Weight Bearing Restrictions: No Vital Signs Therapy Vitals Temp: 97.7 F (36.5 C) Temp Source: Oral Pulse Rate: 96 Resp: 18 BP: (!) 171/110 Patient Position (if appropriate): Sitting Oxygen Therapy SpO2: 99 % O2 Device: Not Delivered Pain Pain Assessment Pain Assessment: Faces Faces Pain Scale: No hurt Vision/Perception  Praxis Praxis: Intact  Cognition Orientation Level: Oriented X4 Safety/Judgment: Appears intact Sensation Sensation Light Touch: Impaired Detail Light Touch Impaired Details: Impaired RLE;Impaired RUE (due to previous CVA 2009) Motor  Motor Motor: Hemiplegia (R sided)  Mobility Bed Mobility Rolling Right: 4: Min  assist Rolling Right Details: Manual facilitation for weight shifting Rolling Left: 5: Supervision Rolling Left Details: Manual facilitation for weight shifting Supine to Sit: 4: Min assist Supine to Sit Details: Manual facilitation for placement;Manual facilitation for weight shifting;Tactile cues for posture;Tactile cues for weight shifting Sit to Supine: 4: Min assist Sit to Supine - Details: Tactile cues for weight shifting;Tactile cues for posture;Manual facilitation for weight shifting;Manual facilitation for placement Transfers Transfers: Yes Sit to Stand: 4: Min assist Sit to Stand Details: Verbal cues for sequencing;Verbal cues for precautions/safety;Manual facilitation for weight shifting;Manual facilitation for placement;Tactile cues for posture;Tactile cues for placement Stand Pivot Transfers: 4: Min assist Stand Pivot Transfer Details: Tactile cues for initiation;Tactile cues for sequencing;Tactile cues for weight shifting;Tactile cues for posture;Tactile cues for placement;Verbal cues for precautions/safety;Manual facilitation for placement;Manual facilitation for weight shifting Squat Pivot Transfers: 4: Min assist Squat Pivot Transfer Details: Tactile cues for initiation;Tactile cues for sequencing;Tactile cues for weight shifting;Tactile cues for posture;Tactile cues for placement;Manual facilitation for placement;Manual facilitation for weight shifting;Verbal cues for precautions/safety Locomotion  Ambulation Ambulation: Yes Ambulation/Gait Assistance: 2: Max assist Ambulation Distance (Feet): 10 Feet Assistive device: Other (Comment) (rail in hallway on L side) Ambulation/Gait Assistance Details: Tactile cues for initiation;Tactile cues for sequencing;Tactile cues for weight shifting;Tactile cues for posture;Tactile cues for placement;Tactile cues for weight beaing;Verbal cues for precautions/safety;Verbal cues for gait pattern;Manual facilitation for placement;Manual  facilitation for weight shifting;Manual facilitation for weight bearing;Verbal cues for sequencing Wheelchair Mobility Wheelchair Mobility: Yes Wheelchair Assistance: 5: Supervision Wheelchair Assistance Details: Verbal cues for precautions/safety Wheelchair Propulsion: Left upper extremity;Left lower extremity Wheelchair Parts Management: Needs assistance Distance: 121f  Trunk/Postural Assessment  Cervical Assessment Cervical Assessment: [facility] to [facility]) Thoracic Assessment Thoracic Assessment: Exceptions to [facility]photic) Lumbar Assessment Lumbar Assessment: Exceptions to [facility](posterior pelvic tilt) Postural Control Postural Control: Within Functional Limits  Balance Balance Balance Assessed: Yes Dynamic Sitting Balance Dynamic Sitting - Level of Assistance: 6: Modified independent (Device/Increase time) Static Standing Balance Static Standing - Level of Assistance: 4: Min assist Dynamic Standing Balance Dynamic Standing - Balance Support: Left upper extremity supported Dynamic Standing - Level of Assistance: 2: Max assist (during ambulation ) Extremity Assessment  RUE Assessment RUE Assessment: Exceptions to [facility] (flaccid hemi plegia) LUE Assessment LUE Assessment: Within Functional Limits RLE Assessment RLE Assessment: Exceptions to [facility] (R sided hemiplegia; activation with weight-bearing but overall poor strength) LLE Assessment LLE Assessment: Within Functional Limits  Pt received seated in w/c in rehab gym with pt's mother and sister. Pt denies pain at start of session and reports feeling better than she did yesterday. Discussed equipment pt has at home with her mother and sister and updated on their conversation with social work for equipment needed. Demonstrated to pt's mother and sister stand pivot transfer from w/c to mat with min A. Pt's sister and mother discussed pt's PLOF with transfers and how she was able to do all transfers independently except to  use the restroom. Student PT educated pt's family on pt's need for more assistance with transfers compared to her PLOF. Pt performed stand pivot transfer from mat>w/c with min A from pt's sister. Pt propelled manual w/c 154f from rehab gym to ortho gym using L hemi technique. Pt performed car transfer from w/c<>vehicle at sedan-height with min A from pt's mother. Pt's mother used gait belt for assistance during transfer from car to w/c and reports using a gait belt at home for all transfers. Pt's mother provided with verbal cues for manual facilitation at pt's R knee to assist with LE stability during transfers. Pt's mother and sister verbalize understanding that pt is weaker and requires more assistance compared to prior to pt's admission. Pt and family verbalize understanding that pt is not to perform transfers without assistance. Pt ambulated holding onto rail in hallway with L UE 120fwith max A for management of R LE and manual facilitation for R knee extension and hip/trunk extension. Mirror used for visual feedback for upright posture during standing and ambulation. Pt expressed need to use the restroom. Min A stand pivot transfer from w/c<>toilet with pt's sister providing assistance and max A to pull pants up/down. Pt left seated in w/c with all needs met and no further questions from pt or family.   See Function Navigator for Current Functional Status.  CaAlysia Penna/08/2016, 4:10 PM

## 2016-06-25 NOTE — Progress Notes (Signed)
Occupational Therapy Session Note  Patient Details  Name: Ann Lynch MRN: [id number] Date of Birth: [birth year]  Today's Date: 06/25/2016 OT Individual Time: 2035-5974 OT Individual Time Calculation (min): 30 min    Short Term Goals: Week 1:  OT Short Term Goal 1 (Week 1): Pt will maintain static standing balance with MIN A in prep for clothing management OT Short Term Goal 2 (Week 1): Pt will don footwear with supervision OT Short Term Goal 3 (Week 1): Pt will perform LB bathing with min a and AE prn OT Short Term Goal 4 (Week 1): Pt will stand at sink for 2 grooming items to improve funcitonal endurance  Skilled Therapeutic Interventions/Progress Updates:    Pt received sitting up in w/c, family present in room. Pt requesting to stay in room for OT treatment session, with focus on activity tolerance and functional mobility transfers. Pt completed multiple sit to stand transfers from w/c with ModA and using L UE support on hemi-walker. Pt requires multimodal cues to facilitate standing posture and to correct posterior pelvic tilt. OT positioned Pt in front of mirror in room to provide visual feedback for standing posture during transfer completion. Pt able to maintain standing approx 30-45 sec before requiring seated rest break. Pt completing toileting during session, with MinA for stand pivot transfer to/from [facility] over toilet and use of grab bar. Pt requires total assist for clothing management, able to perform peri-hygiene independently. Completed grooming ADLs from w/c level at sink with setup. Ended session with Pt sitting up in w/c, family present in room, call bell and needs within reach.   Therapy Documentation Precautions:  Precautions Precautions: Fall Precaution Comments: R hemiplegia Restrictions Weight Bearing Restrictions: No  Pain: Pain Assessment Pain Assessment: Faces Faces Pain Scale: No hurt       See Function Navigator for Current Functional  Status.   Therapy/Group: Individual Therapy  Raymondo Band 06/25/2016, 4:09 PM

## 2016-06-26 DIAGNOSIS — R791 Abnormal coagulation profile: Secondary | ICD-10-CM

## 2016-06-26 DIAGNOSIS — G894 Chronic pain syndrome: Secondary | ICD-10-CM

## 2016-06-26 DIAGNOSIS — I69359 Hemiplegia and hemiparesis following cerebral infarction affecting unspecified side: Secondary | ICD-10-CM | POA: Diagnosis not present

## 2016-06-26 DIAGNOSIS — R569 Unspecified convulsions: Secondary | ICD-10-CM

## 2016-06-26 DIAGNOSIS — I82403 Acute embolism and thrombosis of unspecified deep veins of lower extremity, bilateral: Secondary | ICD-10-CM | POA: Diagnosis not present

## 2016-06-26 DIAGNOSIS — R5381 Other malaise: Secondary | ICD-10-CM | POA: Diagnosis not present

## 2016-06-26 LAB — PROTIME-INR
INR: 3.9
Prothrombin Time: 39.2 seconds — ABNORMAL HIGH (ref 11.4–15.2)

## 2016-06-26 NOTE — Progress Notes (Signed)
ANTICOAGULATION CONSULT NOTE - Follow Up Consult  Pharmacy Consult for Warfarin Indication: DVT  No Known Allergies  Patient Measurements: Height: 5\' 7"  (170.2 cm) Weight: 177 lb 3.2 oz (80.4 kg) IBW/kg (Calculated) : 61.6 Heparin Dosing Weight:   Vital Signs: Temp: 98.6 F (37 C) (06/09 0515) Temp Source: Oral (06/09 0515) BP: 146/94 (06/09 0515) Pulse Rate: 87 (06/09 0515)  Labs:  Recent Labs  06/24/16 0551 06/25/16 0611 06/26/16 0439  HGB 9.1* 9.1*  --   HCT 28.1* 28.5*  --   PLT 383 287  --   LABPROT 42.1* 36.6* 39.2*  INR 4.27* 3.57 3.90    Estimated Creatinine Clearance: 65.7 mL/min (A) (by C-G formula based on SCr of 1.08 mg/dL (H)).   Medications:  Scheduled:  . amitriptyline  25 mg Oral QHS  . aspirin EC  81 mg Oral Daily  . atorvastatin  40 mg Oral QHS  . cloNIDine  0.2 mg Oral TID  . DULoxetine  20 mg Oral QHS  . feeding supplement (ENSURE ENLIVE)  237 mL Oral BID BM  . folic acid  1 mg Oral Daily  . gabapentin  400 mg Oral TID  . metoprolol tartrate  100 mg Oral BID  . pantoprazole  40 mg Oral BID  . phenytoin  100 mg Oral TID  . polyethylene glycol  17 g Oral Daily  . senna-docusate  1 tablet Oral BID  . Warfarin - Pharmacist Dosing Inpatient   Does not apply q1800   Infusions:    Assessment: 51 yoF with DVT is currently on supratherapeutic warfarin. INR has been labile but increased overnight from 3.57 to 3.90. No new CBC, no S/Sx bleeding noted. Pt will need close INR follow-up if discharged today.  Goal of Therapy:  INR 2-3 Monitor platelets by anticoagulation protocol: Yes   Plan:  -Hold warfarin tonight -Daily INR -Monitor for S/Sx bleeding closely  Arrie Senate, PharmD PGY-1 Pharmacy Resident Pager: [phone] 06/26/2016

## 2016-06-26 NOTE — Progress Notes (Signed)
[place] PHYSICAL MEDICINE & REHABILITATION     PROGRESS NOTE    Subjective/Complaints: Pt seen laying in bed this AM.  She slept well overnight.  She is ready to go home.    ROS: Denies CP, SOB, N/V/D.  Objective: Vital Signs: Blood pressure (!) 146/94, pulse 87, temperature 98.6 F (37 C), temperature source Oral, resp. rate 18, height 5\' 7"  (1.702 m), weight 80.4 kg (177 lb 3.2 oz), SpO2 98 %. No results found.  Recent Labs  06/24/16 0551 06/25/16 0611  WBC 10.3 8.8  HGB 9.1* 9.1*  HCT 28.1* 28.5*  PLT 383 287   No results for input(s): NA, K, CL, GLUCOSE, BUN, CREATININE, CALCIUM in the last 72 hours.  Invalid input(s): CO CBG (last 3)  No results for input(s): GLUCAP in the last 72 hours.  Wt Readings from Last 3 Encounters:  06/17/16 80.4 kg (177 lb 3.2 oz)  06/12/16 77.1 kg (170 lb)    Physical Exam:  Constitutional: NAD. Well-developed.  Cardiovascular: RRR. No JVD. Respiratory: CTA B. Unlabored GI: soft, nondistended Skin. Warm and dry.  Musculoskeletal:  No tenderness, mild edema.  Neurological: She is alert and oriented.  Fair awareness of deficits.  RUE 0/5 RLE: 2/5 prox to distal.  Dysconjugate gaze.  Sensation intact to light touch. Psych: pt cooperative but flat  Assessment/Plan: 1. Functional deficits secondary to debility after bilateral LE DVT's which require 3+ hours per day of interdisciplinary therapy in a comprehensive inpatient rehab setting. Physiatrist is providing close team supervision and 24 hour management of active medical problems listed below. Physiatrist and rehab team continue to assess barriers to discharge/monitor patient progress toward functional and medical goals.  Function:  Bathing Bathing position   Position: Shower  Bathing parts Body parts bathed by patient: Left arm, Chest, Abdomen, Front perineal area, Buttocks, Right upper leg, Right lower leg, Left upper leg, Left lower leg Body parts bathed by helper:  Right arm, Back  Bathing assist Assist Level: Touching or steadying assistance(Pt > 75%)      Upper Body Dressing/Undressing Upper body dressing   What is the patient wearing?: Pull over shirt/dress, Bra Bra - Perfomed by patient: Thread/unthread right bra strap, Thread/unthread left bra strap, Hook/unhook bra (pull down sports bra) Bra - Perfomed by helper: Hook/unhook bra (pull down sports bra) Pull over shirt/dress - Perfomed by patient: Thread/unthread right sleeve, Thread/unthread left sleeve, Put head through opening, Pull shirt over trunk Pull over shirt/dress - Perfomed by helper: Pull shirt over trunk        Upper body assist Assist Level: Supervision or verbal cues      Lower Body Dressing/Undressing Lower body dressing   What is the patient wearing?: Pants, Socks, Shoes     Pants- Performed by patient: Thread/unthread left pants leg Pants- Performed by helper: Thread/unthread right pants leg, Pull pants up/down   Non-skid slipper socks- Performed by helper: Don/doff right sock, Don/doff left sock Socks - Performed by patient: Don/doff left sock, Don/doff right sock Socks - Performed by helper: Don/doff right sock Shoes - Performed by patient: Don/doff right shoe, Don/doff left shoe Shoes - Performed by helper: Fasten right, Fasten left       TED Hose - Performed by helper: Don/doff right TED hose, Don/doff left TED hose  Lower body assist Assist for lower body dressing:  (max A )      Toileting Toileting Toileting activity did not occur: No continent bowel/bladder event Toileting steps completed by patient: Performs perineal  hygiene Toileting steps completed by helper: Adjust clothing prior to toileting, Adjust clothing after toileting Toileting Assistive Devices: Grab bar or rail  Toileting assist Assist level: Touching or steadying assistance (Pt.75%)   Transfers Chair/bed transfer   Chair/bed transfer method: Squat pivot, Stand pivot Chair/bed transfer  assist level: Touching or steadying assistance (Pt > 75%) Chair/bed transfer assistive device: Armrests     Locomotion Ambulation     Max distance: 87ft Assist level: Maximal assist (Pt 25 - 49%)   Wheelchair   Type: Manual Max wheelchair distance: 122ft Assist Level: Supervision or verbal cues  Cognition Comprehension Comprehension assist level: Follows complex conversation/direction with extra time/assistive device  Expression Expression assist level: Expresses basic needs/ideas: With no assist  Social Interaction Social Interaction assist level: Interacts appropriately 90% of the time - Needs monitoring or encouragement for participation or interaction.  Problem Solving Problem solving assist level: Solves basic 90% of the time/requires cueing < 10% of the time  Memory Memory assist level: Recognizes or recalls 90% of the time/requires cueing < 10% of the time   Medical Problem List and Plan: 1. Debilitationsecondary to bilateral lower extremity DVTs with history of prior left CVA with right hemiparesis  Will hold d/c today due to supratherapeutic INR with labile/elevated BP  -Patient to see MD in the office for transitional care encounter in 1-2 weeks.  -home health services---will need INR/CBC [facility] by [facility] 2. DVT Anticoagulation:    Coumadin - hold today and tomorrow  INR 3.9 on 6/9 - had detailed discussion with pharmacy regarding trends, dosing and adjustments  Will need to d/c on 0.5mg   -coumadin mgt per pharmacy.   3. Pain Management: Neurontin 400 mg 3 times a day, Elavil 25 mg daily at bedtime, Cymbalta 20 mg daily Oxycodone as needed q4 prn 4. Mood: Provide emotional support 5. Neuropsych: This patient iscapable of making decisions on herown behalf. 6. Skin/Wound Care: Routine skin checks 7. Fluids/Electrolytes/Nutrition: encourage PO 8.Hypertension. Lopressor 100 mg twice a day,clonidine 0.2 mg twice a day. Monitor with increased mobility  Labile, will need  ambulatory monitoring 9.Seizure disorder. Dilantin 100 mg TID 10.AKI/hyponatremia.Renal ultrasound negative. Follow-up chemistries have been stable 11.Leukocytosis. Resolved  Latest WBC stable/decreased to 8.8k on 6/9.  No clinical signs of infection.  -stopped augmentin given potential effect on INR 12. Constipation: resolved -continue with scheduled softener and laxative 13. ABLA: hgb 9.1 6/8  -2u prbc 6/4  -stool OB +, second stool sample neg for OB  -no gross blood loss per rectum  -will need follow up of hgb as outpt while on coumadin  Labs ordered for tomorrow 14. HTN:      Catapres 0.2mg  tid  Controlled 6/9   LOS (Days) 9 A FACE TO FACE EVALUATION WAS PERFORMED  Ankit Lorie Phenix, MD 06/26/2016 9:38 AM

## 2016-06-26 NOTE — Progress Notes (Signed)
Dr. Posey Pronto called to cancel discharge for today.  Patient and mother, Jobe Marker (legal guardian) notified discharge is cancelled.

## 2016-06-27 DIAGNOSIS — I69959 Hemiplegia and hemiparesis following unspecified cerebrovascular disease affecting unspecified side: Secondary | ICD-10-CM

## 2016-06-27 DIAGNOSIS — R791 Abnormal coagulation profile: Secondary | ICD-10-CM

## 2016-06-27 DIAGNOSIS — G894 Chronic pain syndrome: Secondary | ICD-10-CM

## 2016-06-27 DIAGNOSIS — R109 Unspecified abdominal pain: Secondary | ICD-10-CM

## 2016-06-27 DIAGNOSIS — I10 Essential (primary) hypertension: Secondary | ICD-10-CM

## 2016-06-27 DIAGNOSIS — R569 Unspecified convulsions: Secondary | ICD-10-CM

## 2016-06-27 DIAGNOSIS — D62 Acute posthemorrhagic anemia: Secondary | ICD-10-CM

## 2016-06-27 DIAGNOSIS — I82409 Acute embolism and thrombosis of unspecified deep veins of unspecified lower extremity: Secondary | ICD-10-CM

## 2016-06-27 LAB — CBC WITH DIFFERENTIAL/PLATELET
BASOS ABS: 0 10*3/uL (ref 0.0–0.1)
Basophils Relative: 0 %
EOS PCT: 2 %
Eosinophils Absolute: 0.2 10*3/uL (ref 0.0–0.7)
HEMATOCRIT: 29.8 % — AB (ref 36.0–46.0)
Hemoglobin: 9.4 g/dL — ABNORMAL LOW (ref 12.0–15.0)
LYMPHS PCT: 11 %
Lymphs Abs: 1.1 10*3/uL (ref 0.7–4.0)
MCH: 29.7 pg (ref 26.0–34.0)
MCHC: 31.5 g/dL (ref 30.0–36.0)
MCV: 94 fL (ref 78.0–100.0)
Monocytes Absolute: 1.1 10*3/uL — ABNORMAL HIGH (ref 0.1–1.0)
Monocytes Relative: 12 %
NEUTROS ABS: 7.3 10*3/uL (ref 1.7–7.7)
Neutrophils Relative %: 75 %
PLATELETS: 228 10*3/uL (ref 150–400)
RBC: 3.17 MIL/uL — AB (ref 3.87–5.11)
RDW: 15.4 % (ref 11.5–15.5)
WBC: 9.6 10*3/uL (ref 4.0–10.5)

## 2016-06-27 LAB — URINALYSIS, ROUTINE W REFLEX MICROSCOPIC
BILIRUBIN URINE: NEGATIVE
GLUCOSE, UA: NEGATIVE mg/dL
HGB URINE DIPSTICK: NEGATIVE
KETONES UR: NEGATIVE mg/dL
NITRITE: POSITIVE — AB
Protein, ur: NEGATIVE mg/dL
Specific Gravity, Urine: 1.008 (ref 1.005–1.030)
pH: 7 (ref 5.0–8.0)

## 2016-06-27 LAB — PROTIME-INR
INR: 3.37
Prothrombin Time: 34.8 seconds — ABNORMAL HIGH (ref 11.4–15.2)

## 2016-06-27 MED ORDER — WARFARIN 0.5 MG HALF TABLET
0.5000 mg | ORAL_TABLET | Freq: Once | ORAL | Status: DC
  Filled 2016-06-27: qty 1

## 2016-06-27 MED ORDER — WARFARIN SODIUM 1 MG PO TABS
0.5000 mg | ORAL_TABLET | Freq: Once | ORAL | 0 refills | Status: AC
Start: 2016-06-27 — End: ?

## 2016-06-27 NOTE — Progress Notes (Addendum)
[place] PHYSICAL MEDICINE & REHABILITATION     PROGRESS NOTE    Subjective/Complaints: Pt seen laying in bed this AM.  She slept well overnigth and wants to go home now. Family with concerns about abdominal discomfort.   ROS: Denies CP, SOB, N/V/D.  Objective: Vital Signs: Blood pressure (!) 168/112, pulse (!) 108, temperature 98.1 F (36.7 C), temperature source Oral, resp. rate 20, height 5\' 7"  (1.702 m), weight 80.4 kg (177 lb 3.2 oz), SpO2 98 %. No results found.  Recent Labs  06/25/16 0611 06/27/16 0500  WBC 8.8 9.6  HGB 9.1* 9.4*  HCT 28.5* 29.8*  PLT 287 228   No results for input(s): NA, K, CL, GLUCOSE, BUN, CREATININE, CALCIUM in the last 72 hours.  Invalid input(s): CO CBG (last 3)  No results for input(s): GLUCAP in the last 72 hours.  Wt Readings from Last 3 Encounters:  06/17/16 80.4 kg (177 lb 3.2 oz)  06/12/16 77.1 kg (170 lb)    Physical Exam:  Constitutional: NAD. Well-developed.  Cardiovascular: RRR. No JVD. Respiratory: CTA B. Unlabored GI: soft, nondistended Skin. Warm and dry.  Musculoskeletal:  No tenderness, mild edema.  Neurological: She is alert and oriented.  Fair awareness of deficits.  RUE 0/5 (unchanged) RLE: 2/5 prox to distal.  Dysconjugate gaze.  Sensation intact to light touch. Psych: pt cooperative but flat  Assessment/Plan: 1. Functional deficits secondary to debility after bilateral LE DVT's which require 3+ hours per day of interdisciplinary therapy in a comprehensive inpatient rehab setting. Physiatrist is providing close team supervision and 24 hour management of active medical problems listed below. Physiatrist and rehab team continue to assess barriers to discharge/monitor patient progress toward functional and medical goals.  Function:  Bathing Bathing position   Position: Shower  Bathing parts Body parts bathed by patient: Left arm, Chest, Abdomen, Front perineal area, Buttocks, Right upper leg, Right lower leg,  Left upper leg, Left lower leg Body parts bathed by helper: Right arm, Back  Bathing assist Assist Level: Touching or steadying assistance(Pt > 75%)      Upper Body Dressing/Undressing Upper body dressing   What is the patient wearing?: Pull over shirt/dress, Bra Bra - Perfomed by patient: Thread/unthread right bra strap, Thread/unthread left bra strap, Hook/unhook bra (pull down sports bra) Bra - Perfomed by helper: Hook/unhook bra (pull down sports bra) Pull over shirt/dress - Perfomed by patient: Thread/unthread right sleeve, Thread/unthread left sleeve, Put head through opening, Pull shirt over trunk Pull over shirt/dress - Perfomed by helper: Pull shirt over trunk        Upper body assist Assist Level: Supervision or verbal cues      Lower Body Dressing/Undressing Lower body dressing   What is the patient wearing?: Pants, Socks, Shoes     Pants- Performed by patient: Thread/unthread left pants leg Pants- Performed by helper: Thread/unthread right pants leg, Pull pants up/down   Non-skid slipper socks- Performed by helper: Don/doff right sock, Don/doff left sock Socks - Performed by patient: Don/doff left sock, Don/doff right sock Socks - Performed by helper: Don/doff right sock Shoes - Performed by patient: Don/doff right shoe, Don/doff left shoe Shoes - Performed by helper: Fasten right, Fasten left       TED Hose - Performed by helper: Don/doff right TED hose, Don/doff left TED hose  Lower body assist Assist for lower body dressing:  (max A )      Toileting Toileting Toileting activity did not occur: No continent bowel/bladder event Toileting  steps completed by patient: Performs perineal hygiene Toileting steps completed by helper: Adjust clothing prior to toileting, Adjust clothing after toileting Toileting Assistive Devices: Grab bar or rail  Toileting assist Assist level: Touching or steadying assistance (Pt.75%)   Transfers Chair/bed transfer   Chair/bed  transfer method: Squat pivot, Stand pivot Chair/bed transfer assist level: Touching or steadying assistance (Pt > 75%) Chair/bed transfer assistive device: Armrests     Locomotion Ambulation     Max distance: 34ft Assist level: Maximal assist (Pt 25 - 49%)   Wheelchair   Type: Manual Max wheelchair distance: 132ft Assist Level: Supervision or verbal cues  Cognition Comprehension Comprehension assist level: Follows basic conversation/direction with extra time/assistive device  Expression Expression assist level: Expresses basic needs/ideas: With no assist  Social Interaction Social Interaction assist level: Interacts appropriately 90% of the time - Needs monitoring or encouragement for participation or interaction.  Problem Solving Problem solving assist level: Solves basic 90% of the time/requires cueing < 10% of the time  Memory Memory assist level: Recognizes or recalls 90% of the time/requires cueing < 10% of the time   Medical Problem List and Plan: 1. Debilitationsecondary to bilateral lower extremity DVTs with history of prior left CVA with right hemiparesis  D/c today  -Patient to see MD in the office for transitional care encounter in 1-2 weeks.  -home health services---will need INR/CBC followed by [facility] 2. DVT Anticoagulation:    Coumadin   INR 3.37 on 6/10 - hold today and resume tomorrow at 0.5mg   Will need to d/c on 0.5mg  3. Pain Management: Neurontin 400 mg 3 times a day, Elavil 25 mg daily at bedtime, Cymbalta 20 mg daily Oxycodone as needed q4 prn 4. Mood: Provide emotional support 5. Neuropsych: This patient iscapable of making decisions on herown behalf. 6. Skin/Wound Care: Routine skin checks 7. Fluids/Electrolytes/Nutrition: encourage PO 8.Hypertension. Lopressor 100 mg twice a day,clonidine 0.2 mg twice a day. Monitor with increased mobility  Catapres 0.2mg  tid  Labile, will need ambulatory monitoring 9.Seizure disorder. Dilantin 100 mg  TID 10.AKI/hyponatremia.Renal ultrasound negative. Follow-up chemistries have been stable 11.Leukocytosis. Resolved  Latest WBC stable/decreased to 8.8k on 6/9.  No clinical signs of infection.  -stopped augmentin given potential effect on INR 12. Constipation: resolved -continue with scheduled softener and laxative 13. ABLA: hgb 9.4 on 6/10  -2u prbc 6/4  -stool OB +, second stool sample neg for OB  -no gross blood loss per rectum  -will need follow up of hgb as outpt while on coumadin 14. Abdominal discomfort  Intermittent started yesterday per nursing  Ua/Ucx ordered  >30 spent in counseling and coordination of care with family, patient, and pharmacy discussing abdominal discomfort, meds, adjustments, and need for follow up appointments   LOS (Days) [place]  Miesha Bachmann Lorie Phenix, MD 06/27/2016 8:47 AM

## 2016-06-27 NOTE — Progress Notes (Addendum)
Patient c/o lower abdominal pain that is 5/10. Rn gave her percocet 1 tablet with relief after an hour. This morning asked if the pain is still bothering her and she said she is not in pain. MD aware. Order received for ua/cs.

## 2016-06-27 NOTE — Progress Notes (Signed)
Pt A/O, no noted distress. Denies pain. Educated pt and mother and discharge instruction and follow up appt. Vital WNL. Tolerated all meds well. Urine collections collected.

## 2016-06-27 NOTE — Progress Notes (Addendum)
ANTICOAGULATION CONSULT NOTE - Follow Up Consult  Pharmacy Consult for Warfarin Indication: DVT  No Known Allergies  Patient Measurements: Height: 5\' 7"  (170.2 cm) Weight: 177 lb 3.2 oz (80.4 kg) IBW/kg (Calculated) : 61.6 Heparin Dosing Weight:   Vital Signs: Temp: 98.1 F (36.7 C) (06/10 0528) Temp Source: Oral (06/10 0528) BP: 168/112 (06/10 0800) Pulse Rate: 108 (06/10 0800)  Labs:  Recent Labs  06/25/16 0611 06/26/16 0439 06/27/16 0500  HGB 9.1*  --  9.4*  HCT 28.5*  --  29.8*  PLT 287  --  228  LABPROT 36.6* 39.2* 34.8*  INR 3.57 3.90 3.37    Estimated Creatinine Clearance: 65.7 mL/min (A) (by C-G formula based on SCr of 1.08 mg/dL (H)).   Medications:  Scheduled:  . amitriptyline  25 mg Oral QHS  . aspirin EC  81 mg Oral Daily  . atorvastatin  40 mg Oral QHS  . cloNIDine  0.2 mg Oral TID  . DULoxetine  20 mg Oral QHS  . feeding supplement (ENSURE ENLIVE)  237 mL Oral BID BM  . folic acid  1 mg Oral Daily  . gabapentin  400 mg Oral TID  . metoprolol tartrate  100 mg Oral BID  . pantoprazole  40 mg Oral BID  . phenytoin  100 mg Oral TID  . polyethylene glycol  17 g Oral Daily  . senna-docusate  1 tablet Oral BID  . Warfarin - Pharmacist Dosing Inpatient   Does not apply q1800   Infusions:    Assessment: 25 yoF with DVT is currently on supratherapeutic warfarin. INR has been labile this admit and pt appears to be very warfarin-sensitive. INR down from 3.90 to 3.37 s/p holding dose last night. No overt S/Sx bleeding noted, CBC stable. INR likely to continue trending down, will give reduced dose this evening.  Goal of Therapy:  INR 2-3 Monitor platelets by anticoagulation protocol: Yes   Plan:  -Warfarin 0.5mg  PO x1 tonight -Daily INR -Monitor for S/Sx bleeding closely  Arrie Senate, PharmD PGY-1 Pharmacy Resident Pager: [phone] 06/27/2016

## 2016-06-30 DIAGNOSIS — I10 Essential (primary) hypertension: Secondary | ICD-10-CM | POA: Diagnosis not present

## 2016-06-30 DIAGNOSIS — N390 Urinary tract infection, site not specified: Secondary | ICD-10-CM | POA: Diagnosis not present

## 2016-06-30 DIAGNOSIS — I82493 Acute embolism and thrombosis of other specified deep vein of lower extremity, bilateral: Secondary | ICD-10-CM | POA: Diagnosis not present

## 2016-06-30 DIAGNOSIS — Z9181 History of falling: Secondary | ICD-10-CM | POA: Diagnosis not present

## 2016-06-30 DIAGNOSIS — G40909 Epilepsy, unspecified, not intractable, without status epilepticus: Secondary | ICD-10-CM | POA: Diagnosis not present

## 2016-06-30 DIAGNOSIS — Z5181 Encounter for therapeutic drug level monitoring: Secondary | ICD-10-CM | POA: Diagnosis not present

## 2016-06-30 DIAGNOSIS — Z7901 Long term (current) use of anticoagulants: Secondary | ICD-10-CM | POA: Diagnosis not present

## 2016-06-30 DIAGNOSIS — I69351 Hemiplegia and hemiparesis following cerebral infarction affecting right dominant side: Secondary | ICD-10-CM | POA: Diagnosis not present

## 2016-06-30 LAB — URINE CULTURE: Culture: >=100000 — AB

## 2016-07-02 DIAGNOSIS — I82493 Acute embolism and thrombosis of other specified deep vein of lower extremity, bilateral: Secondary | ICD-10-CM | POA: Diagnosis not present

## 2016-07-02 DIAGNOSIS — Z5181 Encounter for therapeutic drug level monitoring: Secondary | ICD-10-CM | POA: Diagnosis not present

## 2016-07-02 DIAGNOSIS — G40909 Epilepsy, unspecified, not intractable, without status epilepticus: Secondary | ICD-10-CM | POA: Diagnosis not present

## 2016-07-02 DIAGNOSIS — I10 Essential (primary) hypertension: Secondary | ICD-10-CM | POA: Diagnosis not present

## 2016-07-02 DIAGNOSIS — N390 Urinary tract infection, site not specified: Secondary | ICD-10-CM | POA: Diagnosis not present

## 2016-07-02 DIAGNOSIS — Z7901 Long term (current) use of anticoagulants: Secondary | ICD-10-CM | POA: Diagnosis not present

## 2016-07-02 DIAGNOSIS — Z9181 History of falling: Secondary | ICD-10-CM | POA: Diagnosis not present

## 2016-07-02 DIAGNOSIS — I69351 Hemiplegia and hemiparesis following cerebral infarction affecting right dominant side: Secondary | ICD-10-CM | POA: Diagnosis not present

## 2016-07-05 DIAGNOSIS — G40909 Epilepsy, unspecified, not intractable, without status epilepticus: Secondary | ICD-10-CM | POA: Diagnosis not present

## 2016-07-05 DIAGNOSIS — Z9181 History of falling: Secondary | ICD-10-CM | POA: Diagnosis not present

## 2016-07-05 DIAGNOSIS — E785 Hyperlipidemia, unspecified: Secondary | ICD-10-CM | POA: Diagnosis not present

## 2016-07-05 DIAGNOSIS — I82402 Acute embolism and thrombosis of unspecified deep veins of left lower extremity: Secondary | ICD-10-CM | POA: Diagnosis not present

## 2016-07-05 DIAGNOSIS — N390 Urinary tract infection, site not specified: Secondary | ICD-10-CM | POA: Diagnosis not present

## 2016-07-05 DIAGNOSIS — Z5181 Encounter for therapeutic drug level monitoring: Secondary | ICD-10-CM | POA: Diagnosis not present

## 2016-07-05 DIAGNOSIS — R69 Illness, unspecified: Secondary | ICD-10-CM | POA: Diagnosis not present

## 2016-07-05 DIAGNOSIS — I69351 Hemiplegia and hemiparesis following cerebral infarction affecting right dominant side: Secondary | ICD-10-CM | POA: Diagnosis not present

## 2016-07-05 DIAGNOSIS — I10 Essential (primary) hypertension: Secondary | ICD-10-CM | POA: Diagnosis not present

## 2016-07-05 DIAGNOSIS — I639 Cerebral infarction, unspecified: Secondary | ICD-10-CM | POA: Diagnosis not present

## 2016-07-05 DIAGNOSIS — I82493 Acute embolism and thrombosis of other specified deep vein of lower extremity, bilateral: Secondary | ICD-10-CM | POA: Diagnosis not present

## 2016-07-05 DIAGNOSIS — Z7901 Long term (current) use of anticoagulants: Secondary | ICD-10-CM | POA: Diagnosis not present

## 2016-07-06 DIAGNOSIS — I82493 Acute embolism and thrombosis of other specified deep vein of lower extremity, bilateral: Secondary | ICD-10-CM | POA: Diagnosis not present

## 2016-07-06 DIAGNOSIS — I69351 Hemiplegia and hemiparesis following cerebral infarction affecting right dominant side: Secondary | ICD-10-CM | POA: Diagnosis not present

## 2016-07-06 DIAGNOSIS — I10 Essential (primary) hypertension: Secondary | ICD-10-CM | POA: Diagnosis not present

## 2016-07-06 DIAGNOSIS — Z9181 History of falling: Secondary | ICD-10-CM | POA: Diagnosis not present

## 2016-07-06 DIAGNOSIS — N390 Urinary tract infection, site not specified: Secondary | ICD-10-CM | POA: Diagnosis not present

## 2016-07-06 DIAGNOSIS — Z5181 Encounter for therapeutic drug level monitoring: Secondary | ICD-10-CM | POA: Diagnosis not present

## 2016-07-06 DIAGNOSIS — G40909 Epilepsy, unspecified, not intractable, without status epilepticus: Secondary | ICD-10-CM | POA: Diagnosis not present

## 2016-07-06 DIAGNOSIS — Z7901 Long term (current) use of anticoagulants: Secondary | ICD-10-CM | POA: Diagnosis not present

## 2016-07-07 ENCOUNTER — Telehealth: Payer: Self-pay | Admitting: *Deleted

## 2016-07-07 NOTE — Telephone Encounter (Signed)
Merry Proud, PT, [facility] left a message asking for verbal orders for HHPT 2week3 to address strengthening, transfer training, and fall prevention.  Verbal orders given per office protocol

## 2016-07-08 DIAGNOSIS — Z7901 Long term (current) use of anticoagulants: Secondary | ICD-10-CM | POA: Diagnosis not present

## 2016-07-08 DIAGNOSIS — G40909 Epilepsy, unspecified, not intractable, without status epilepticus: Secondary | ICD-10-CM | POA: Diagnosis not present

## 2016-07-08 DIAGNOSIS — Z5181 Encounter for therapeutic drug level monitoring: Secondary | ICD-10-CM | POA: Diagnosis not present

## 2016-07-08 DIAGNOSIS — N390 Urinary tract infection, site not specified: Secondary | ICD-10-CM | POA: Diagnosis not present

## 2016-07-08 DIAGNOSIS — I10 Essential (primary) hypertension: Secondary | ICD-10-CM | POA: Diagnosis not present

## 2016-07-08 DIAGNOSIS — I69351 Hemiplegia and hemiparesis following cerebral infarction affecting right dominant side: Secondary | ICD-10-CM | POA: Diagnosis not present

## 2016-07-08 DIAGNOSIS — Z9181 History of falling: Secondary | ICD-10-CM | POA: Diagnosis not present

## 2016-07-08 DIAGNOSIS — I82493 Acute embolism and thrombosis of other specified deep vein of lower extremity, bilateral: Secondary | ICD-10-CM | POA: Diagnosis not present

## 2016-07-12 DIAGNOSIS — I10 Essential (primary) hypertension: Secondary | ICD-10-CM | POA: Diagnosis not present

## 2016-07-12 DIAGNOSIS — N390 Urinary tract infection, site not specified: Secondary | ICD-10-CM | POA: Diagnosis not present

## 2016-07-12 DIAGNOSIS — Z9181 History of falling: Secondary | ICD-10-CM | POA: Diagnosis not present

## 2016-07-12 DIAGNOSIS — I82493 Acute embolism and thrombosis of other specified deep vein of lower extremity, bilateral: Secondary | ICD-10-CM | POA: Diagnosis not present

## 2016-07-12 DIAGNOSIS — G40909 Epilepsy, unspecified, not intractable, without status epilepticus: Secondary | ICD-10-CM | POA: Diagnosis not present

## 2016-07-12 DIAGNOSIS — Z7901 Long term (current) use of anticoagulants: Secondary | ICD-10-CM | POA: Diagnosis not present

## 2016-07-12 DIAGNOSIS — I69351 Hemiplegia and hemiparesis following cerebral infarction affecting right dominant side: Secondary | ICD-10-CM | POA: Diagnosis not present

## 2016-07-12 DIAGNOSIS — Z5181 Encounter for therapeutic drug level monitoring: Secondary | ICD-10-CM | POA: Diagnosis not present

## 2016-07-13 DIAGNOSIS — I69351 Hemiplegia and hemiparesis following cerebral infarction affecting right dominant side: Secondary | ICD-10-CM | POA: Diagnosis not present

## 2016-07-13 DIAGNOSIS — N390 Urinary tract infection, site not specified: Secondary | ICD-10-CM | POA: Diagnosis not present

## 2016-07-13 DIAGNOSIS — G40909 Epilepsy, unspecified, not intractable, without status epilepticus: Secondary | ICD-10-CM | POA: Diagnosis not present

## 2016-07-13 DIAGNOSIS — Z9181 History of falling: Secondary | ICD-10-CM | POA: Diagnosis not present

## 2016-07-13 DIAGNOSIS — Z5181 Encounter for therapeutic drug level monitoring: Secondary | ICD-10-CM | POA: Diagnosis not present

## 2016-07-13 DIAGNOSIS — I10 Essential (primary) hypertension: Secondary | ICD-10-CM | POA: Diagnosis not present

## 2016-07-13 DIAGNOSIS — Z7901 Long term (current) use of anticoagulants: Secondary | ICD-10-CM | POA: Diagnosis not present

## 2016-07-13 DIAGNOSIS — I82493 Acute embolism and thrombosis of other specified deep vein of lower extremity, bilateral: Secondary | ICD-10-CM | POA: Diagnosis not present

## 2016-07-14 DIAGNOSIS — Z7901 Long term (current) use of anticoagulants: Secondary | ICD-10-CM | POA: Diagnosis not present

## 2016-07-14 DIAGNOSIS — N390 Urinary tract infection, site not specified: Secondary | ICD-10-CM | POA: Diagnosis not present

## 2016-07-14 DIAGNOSIS — I82493 Acute embolism and thrombosis of other specified deep vein of lower extremity, bilateral: Secondary | ICD-10-CM | POA: Diagnosis not present

## 2016-07-14 DIAGNOSIS — I10 Essential (primary) hypertension: Secondary | ICD-10-CM | POA: Diagnosis not present

## 2016-07-14 DIAGNOSIS — Z9181 History of falling: Secondary | ICD-10-CM | POA: Diagnosis not present

## 2016-07-14 DIAGNOSIS — I69351 Hemiplegia and hemiparesis following cerebral infarction affecting right dominant side: Secondary | ICD-10-CM | POA: Diagnosis not present

## 2016-07-14 DIAGNOSIS — Z5181 Encounter for therapeutic drug level monitoring: Secondary | ICD-10-CM | POA: Diagnosis not present

## 2016-07-14 DIAGNOSIS — G40909 Epilepsy, unspecified, not intractable, without status epilepticus: Secondary | ICD-10-CM | POA: Diagnosis not present

## 2016-07-15 DIAGNOSIS — Z5181 Encounter for therapeutic drug level monitoring: Secondary | ICD-10-CM | POA: Diagnosis not present

## 2016-07-15 DIAGNOSIS — I10 Essential (primary) hypertension: Secondary | ICD-10-CM | POA: Diagnosis not present

## 2016-07-15 DIAGNOSIS — N390 Urinary tract infection, site not specified: Secondary | ICD-10-CM | POA: Diagnosis not present

## 2016-07-15 DIAGNOSIS — I69351 Hemiplegia and hemiparesis following cerebral infarction affecting right dominant side: Secondary | ICD-10-CM | POA: Diagnosis not present

## 2016-07-15 DIAGNOSIS — G40909 Epilepsy, unspecified, not intractable, without status epilepticus: Secondary | ICD-10-CM | POA: Diagnosis not present

## 2016-07-15 DIAGNOSIS — I82493 Acute embolism and thrombosis of other specified deep vein of lower extremity, bilateral: Secondary | ICD-10-CM | POA: Diagnosis not present

## 2016-07-15 DIAGNOSIS — Z7901 Long term (current) use of anticoagulants: Secondary | ICD-10-CM | POA: Diagnosis not present

## 2016-07-15 DIAGNOSIS — Z9181 History of falling: Secondary | ICD-10-CM | POA: Diagnosis not present

## 2016-07-16 DIAGNOSIS — Z9181 History of falling: Secondary | ICD-10-CM | POA: Diagnosis not present

## 2016-07-16 DIAGNOSIS — I10 Essential (primary) hypertension: Secondary | ICD-10-CM | POA: Diagnosis not present

## 2016-07-16 DIAGNOSIS — N390 Urinary tract infection, site not specified: Secondary | ICD-10-CM | POA: Diagnosis not present

## 2016-07-16 DIAGNOSIS — I69351 Hemiplegia and hemiparesis following cerebral infarction affecting right dominant side: Secondary | ICD-10-CM | POA: Diagnosis not present

## 2016-07-16 DIAGNOSIS — Z7901 Long term (current) use of anticoagulants: Secondary | ICD-10-CM | POA: Diagnosis not present

## 2016-07-16 DIAGNOSIS — Z5181 Encounter for therapeutic drug level monitoring: Secondary | ICD-10-CM | POA: Diagnosis not present

## 2016-07-16 DIAGNOSIS — G40909 Epilepsy, unspecified, not intractable, without status epilepticus: Secondary | ICD-10-CM | POA: Diagnosis not present

## 2016-07-16 DIAGNOSIS — I82493 Acute embolism and thrombosis of other specified deep vein of lower extremity, bilateral: Secondary | ICD-10-CM | POA: Diagnosis not present

## 2016-07-23 DIAGNOSIS — Z7901 Long term (current) use of anticoagulants: Secondary | ICD-10-CM | POA: Diagnosis not present

## 2016-07-23 DIAGNOSIS — Z9181 History of falling: Secondary | ICD-10-CM | POA: Diagnosis not present

## 2016-07-23 DIAGNOSIS — Z5181 Encounter for therapeutic drug level monitoring: Secondary | ICD-10-CM | POA: Diagnosis not present

## 2016-07-23 DIAGNOSIS — I69351 Hemiplegia and hemiparesis following cerebral infarction affecting right dominant side: Secondary | ICD-10-CM | POA: Diagnosis not present

## 2016-07-23 DIAGNOSIS — G40909 Epilepsy, unspecified, not intractable, without status epilepticus: Secondary | ICD-10-CM | POA: Diagnosis not present

## 2016-07-23 DIAGNOSIS — I10 Essential (primary) hypertension: Secondary | ICD-10-CM | POA: Diagnosis not present

## 2016-07-23 DIAGNOSIS — N390 Urinary tract infection, site not specified: Secondary | ICD-10-CM | POA: Diagnosis not present

## 2016-07-23 DIAGNOSIS — I82493 Acute embolism and thrombosis of other specified deep vein of lower extremity, bilateral: Secondary | ICD-10-CM | POA: Diagnosis not present

## 2016-07-26 ENCOUNTER — Encounter (HOSPITAL_COMMUNITY): Payer: Self-pay

## 2016-07-26 ENCOUNTER — Telehealth: Payer: Self-pay | Admitting: *Deleted

## 2016-07-26 ENCOUNTER — Other Ambulatory Visit: Payer: Self-pay | Admitting: Physical Medicine and Rehabilitation

## 2016-07-26 DIAGNOSIS — N12 Tubulo-interstitial nephritis, not specified as acute or chronic: Secondary | ICD-10-CM | POA: Diagnosis not present

## 2016-07-26 DIAGNOSIS — Z6823 Body mass index (BMI) 23.0-23.9, adult: Secondary | ICD-10-CM

## 2016-07-26 DIAGNOSIS — C787 Secondary malignant neoplasm of liver and intrahepatic bile duct: Secondary | ICD-10-CM | POA: Diagnosis present

## 2016-07-26 DIAGNOSIS — F418 Other specified anxiety disorders: Secondary | ICD-10-CM | POA: Diagnosis present

## 2016-07-26 DIAGNOSIS — E860 Dehydration: Secondary | ICD-10-CM | POA: Diagnosis not present

## 2016-07-26 DIAGNOSIS — N179 Acute kidney failure, unspecified: Principal | ICD-10-CM | POA: Diagnosis present

## 2016-07-26 DIAGNOSIS — Z66 Do not resuscitate: Secondary | ICD-10-CM | POA: Diagnosis present

## 2016-07-26 DIAGNOSIS — N136 Pyonephrosis: Secondary | ICD-10-CM | POA: Diagnosis present

## 2016-07-26 DIAGNOSIS — R5381 Other malaise: Secondary | ICD-10-CM | POA: Diagnosis not present

## 2016-07-26 DIAGNOSIS — I69359 Hemiplegia and hemiparesis following cerebral infarction affecting unspecified side: Secondary | ICD-10-CM | POA: Diagnosis not present

## 2016-07-26 DIAGNOSIS — C252 Malignant neoplasm of tail of pancreas: Secondary | ICD-10-CM | POA: Diagnosis present

## 2016-07-26 DIAGNOSIS — Z79899 Other long term (current) drug therapy: Secondary | ICD-10-CM

## 2016-07-26 DIAGNOSIS — Z7401 Bed confinement status: Secondary | ICD-10-CM

## 2016-07-26 DIAGNOSIS — D6489 Other specified anemias: Secondary | ICD-10-CM | POA: Diagnosis present

## 2016-07-26 DIAGNOSIS — J9601 Acute respiratory failure with hypoxia: Secondary | ICD-10-CM | POA: Diagnosis not present

## 2016-07-26 DIAGNOSIS — J942 Hemothorax: Secondary | ICD-10-CM | POA: Diagnosis not present

## 2016-07-26 DIAGNOSIS — R188 Other ascites: Secondary | ICD-10-CM | POA: Diagnosis present

## 2016-07-26 DIAGNOSIS — N289 Disorder of kidney and ureter, unspecified: Secondary | ICD-10-CM | POA: Diagnosis not present

## 2016-07-26 DIAGNOSIS — Z3201 Encounter for pregnancy test, result positive: Secondary | ICD-10-CM | POA: Diagnosis present

## 2016-07-26 DIAGNOSIS — E7800 Pure hypercholesterolemia, unspecified: Secondary | ICD-10-CM | POA: Diagnosis present

## 2016-07-26 DIAGNOSIS — R112 Nausea with vomiting, unspecified: Secondary | ICD-10-CM | POA: Diagnosis present

## 2016-07-26 DIAGNOSIS — I82501 Chronic embolism and thrombosis of unspecified deep veins of right lower extremity: Secondary | ICD-10-CM | POA: Diagnosis present

## 2016-07-26 DIAGNOSIS — Z515 Encounter for palliative care: Secondary | ICD-10-CM | POA: Diagnosis present

## 2016-07-26 DIAGNOSIS — R109 Unspecified abdominal pain: Secondary | ICD-10-CM | POA: Diagnosis not present

## 2016-07-26 DIAGNOSIS — G8929 Other chronic pain: Secondary | ICD-10-CM | POA: Diagnosis present

## 2016-07-26 DIAGNOSIS — D638 Anemia in other chronic diseases classified elsewhere: Secondary | ICD-10-CM | POA: Diagnosis present

## 2016-07-26 DIAGNOSIS — I2510 Atherosclerotic heart disease of native coronary artery without angina pectoris: Secondary | ICD-10-CM | POA: Diagnosis present

## 2016-07-26 DIAGNOSIS — C7802 Secondary malignant neoplasm of left lung: Secondary | ICD-10-CM | POA: Diagnosis present

## 2016-07-26 DIAGNOSIS — Z87891 Personal history of nicotine dependence: Secondary | ICD-10-CM

## 2016-07-26 DIAGNOSIS — E43 Unspecified severe protein-calorie malnutrition: Secondary | ICD-10-CM | POA: Diagnosis present

## 2016-07-26 DIAGNOSIS — Z7982 Long term (current) use of aspirin: Secondary | ICD-10-CM

## 2016-07-26 DIAGNOSIS — K7290 Hepatic failure, unspecified without coma: Secondary | ICD-10-CM | POA: Diagnosis present

## 2016-07-26 DIAGNOSIS — C786 Secondary malignant neoplasm of retroperitoneum and peritoneum: Secondary | ICD-10-CM | POA: Diagnosis present

## 2016-07-26 DIAGNOSIS — E876 Hypokalemia: Secondary | ICD-10-CM | POA: Diagnosis present

## 2016-07-26 DIAGNOSIS — Z7901 Long term (current) use of anticoagulants: Secondary | ICD-10-CM

## 2016-07-26 DIAGNOSIS — S0083XA Contusion of other part of head, initial encounter: Secondary | ICD-10-CM | POA: Diagnosis present

## 2016-07-26 DIAGNOSIS — I69351 Hemiplegia and hemiparesis following cerebral infarction affecting right dominant side: Secondary | ICD-10-CM

## 2016-07-26 DIAGNOSIS — D62 Acute posthemorrhagic anemia: Secondary | ICD-10-CM | POA: Diagnosis present

## 2016-07-26 DIAGNOSIS — J90 Pleural effusion, not elsewhere classified: Secondary | ICD-10-CM | POA: Diagnosis not present

## 2016-07-26 DIAGNOSIS — R1110 Vomiting, unspecified: Secondary | ICD-10-CM | POA: Diagnosis not present

## 2016-07-26 DIAGNOSIS — I10 Essential (primary) hypertension: Secondary | ICD-10-CM | POA: Diagnosis present

## 2016-07-26 DIAGNOSIS — Z95828 Presence of other vascular implants and grafts: Secondary | ICD-10-CM

## 2016-07-26 DIAGNOSIS — Z993 Dependence on wheelchair: Secondary | ICD-10-CM

## 2016-07-26 DIAGNOSIS — I82403 Acute embolism and thrombosis of unspecified deep veins of lower extremity, bilateral: Secondary | ICD-10-CM | POA: Diagnosis not present

## 2016-07-26 DIAGNOSIS — K219 Gastro-esophageal reflux disease without esophagitis: Secondary | ICD-10-CM | POA: Diagnosis present

## 2016-07-26 DIAGNOSIS — B9620 Unspecified Escherichia coli [E. coli] as the cause of diseases classified elsewhere: Secondary | ICD-10-CM | POA: Diagnosis present

## 2016-07-26 DIAGNOSIS — G40909 Epilepsy, unspecified, not intractable, without status epilepticus: Secondary | ICD-10-CM | POA: Diagnosis present

## 2016-07-26 DIAGNOSIS — R102 Pelvic and perineal pain: Secondary | ICD-10-CM | POA: Diagnosis not present

## 2016-07-26 LAB — COMPREHENSIVE METABOLIC PANEL
ALK PHOS: 140 U/L — AB (ref 38–126)
ALT: 27 U/L (ref 14–54)
ANION GAP: 15 (ref 5–15)
AST: 89 U/L — ABNORMAL HIGH (ref 15–41)
Albumin: 2.2 g/dL — ABNORMAL LOW (ref 3.5–5.0)
BILIRUBIN TOTAL: 1.3 mg/dL — AB (ref 0.3–1.2)
BUN: 63 mg/dL — AB (ref 6–20)
CALCIUM: 8.5 mg/dL — AB (ref 8.9–10.3)
CO2: 26 mmol/L (ref 22–32)
Chloride: 91 mmol/L — ABNORMAL LOW (ref 101–111)
Creatinine, Ser: 2.19 mg/dL — ABNORMAL HIGH (ref 0.44–1.00)
GFR calc Af Amer: 28 mL/min — ABNORMAL LOW (ref >60)
GFR, EST NON AFRICAN AMERICAN: 24 mL/min — AB (ref >60)
GLUCOSE: 129 mg/dL — AB (ref 65–99)
POTASSIUM: 3.1 mmol/L — AB (ref 3.5–5.1)
Sodium: 132 mmol/L — ABNORMAL LOW (ref 135–145)
TOTAL PROTEIN: 8.6 g/dL — AB (ref 6.5–8.1)

## 2016-07-26 LAB — CBC WITH DIFFERENTIAL/PLATELET
BASOS ABS: 0 10*3/uL (ref 0.0–0.1)
BASOS PCT: 0 %
EOS PCT: 0 %
Eosinophils Absolute: 0 10*3/uL (ref 0.0–0.7)
HCT: 24.1 % — ABNORMAL LOW (ref 36.0–46.0)
Hemoglobin: 7.8 g/dL — ABNORMAL LOW (ref 12.0–15.0)
LYMPHS PCT: 11 %
Lymphs Abs: 1.8 10*3/uL (ref 0.7–4.0)
MCH: 29.8 pg (ref 26.0–34.0)
MCHC: 32.4 g/dL (ref 30.0–36.0)
MCV: 92 fL (ref 78.0–100.0)
MONO ABS: 1.2 10*3/uL — AB (ref 0.1–1.0)
Monocytes Relative: 8 %
NEUTROS ABS: 13.3 10*3/uL — AB (ref 1.7–7.7)
Neutrophils Relative %: 81 %
PLATELETS: 454 10*3/uL — AB (ref 150–400)
RBC: 2.62 MIL/uL — AB (ref 3.87–5.11)
RDW: 18.1 % — AB (ref 11.5–15.5)
WBC: 16.3 10*3/uL — AB (ref 4.0–10.5)

## 2016-07-26 LAB — I-STAT BETA HCG BLOOD, ED (MC, WL, AP ONLY): I-stat hCG, quantitative: 1253.5 m[IU]/mL — ABNORMAL HIGH (ref <5)

## 2016-07-26 LAB — I-STAT CG4 LACTIC ACID, ED: Lactic Acid, Venous: 1.84 mmol/L (ref 0.5–1.9)

## 2016-07-26 NOTE — Telephone Encounter (Signed)
Ann Lynch, PT, [facility] called and left a message asking for verbal orders for home health physical therapy 2week3 to maximize transfer training and caregiver training in that same regard.jjVerbal orders given per office protocol

## 2016-07-26 NOTE — ED Triage Notes (Signed)
Pt here with her caregiver(mother). Pt endorses abd pain, 1 episode of vomiting and not eating x several days. Pt had recent DVT and treated with coumadin. Pt's INR was over 6 on Friday and supposed to have it redrawn tomorrow. Denies tarry stools. Pt also unable to swallow with ease since yesterday and has hx of CVA. Pt's mother states this is new. Pt tachycardic in triage.

## 2016-07-27 ENCOUNTER — Emergency Department (HOSPITAL_COMMUNITY): Payer: Medicare HMO

## 2016-07-27 ENCOUNTER — Encounter (HOSPITAL_COMMUNITY): Payer: Self-pay | Admitting: Family Medicine

## 2016-07-27 ENCOUNTER — Inpatient Hospital Stay (HOSPITAL_COMMUNITY)
Admission: EM | Admit: 2016-07-27 | Discharge: 2016-08-18 | DRG: 682 | Disposition: E | Payer: Medicare HMO | Attending: Internal Medicine | Admitting: Internal Medicine

## 2016-07-27 DIAGNOSIS — R112 Nausea with vomiting, unspecified: Secondary | ICD-10-CM

## 2016-07-27 DIAGNOSIS — N179 Acute kidney failure, unspecified: Secondary | ICD-10-CM | POA: Diagnosis present

## 2016-07-27 DIAGNOSIS — K7460 Unspecified cirrhosis of liver: Secondary | ICD-10-CM

## 2016-07-27 DIAGNOSIS — E876 Hypokalemia: Secondary | ICD-10-CM | POA: Diagnosis not present

## 2016-07-27 DIAGNOSIS — R102 Pelvic and perineal pain: Secondary | ICD-10-CM | POA: Diagnosis not present

## 2016-07-27 DIAGNOSIS — R109 Unspecified abdominal pain: Secondary | ICD-10-CM | POA: Diagnosis not present

## 2016-07-27 DIAGNOSIS — I2510 Atherosclerotic heart disease of native coronary artery without angina pectoris: Secondary | ICD-10-CM | POA: Diagnosis present

## 2016-07-27 DIAGNOSIS — Z7189 Other specified counseling: Secondary | ICD-10-CM

## 2016-07-27 DIAGNOSIS — Z9889 Other specified postprocedural states: Secondary | ICD-10-CM

## 2016-07-27 DIAGNOSIS — R932 Abnormal findings on diagnostic imaging of liver and biliary tract: Secondary | ICD-10-CM | POA: Diagnosis not present

## 2016-07-27 DIAGNOSIS — J90 Pleural effusion, not elsewhere classified: Secondary | ICD-10-CM | POA: Diagnosis not present

## 2016-07-27 DIAGNOSIS — Z3201 Encounter for pregnancy test, result positive: Secondary | ICD-10-CM

## 2016-07-27 DIAGNOSIS — Z8673 Personal history of transient ischemic attack (TIA), and cerebral infarction without residual deficits: Secondary | ICD-10-CM | POA: Diagnosis not present

## 2016-07-27 DIAGNOSIS — J910 Malignant pleural effusion: Secondary | ICD-10-CM | POA: Diagnosis not present

## 2016-07-27 DIAGNOSIS — I82509 Chronic embolism and thrombosis of unspecified deep veins of unspecified lower extremity: Secondary | ICD-10-CM | POA: Diagnosis not present

## 2016-07-27 DIAGNOSIS — C786 Secondary malignant neoplasm of retroperitoneum and peritoneum: Secondary | ICD-10-CM | POA: Diagnosis not present

## 2016-07-27 DIAGNOSIS — Z6823 Body mass index (BMI) 23.0-23.9, adult: Secondary | ICD-10-CM | POA: Diagnosis not present

## 2016-07-27 DIAGNOSIS — J948 Other specified pleural conditions: Secondary | ICD-10-CM | POA: Diagnosis not present

## 2016-07-27 DIAGNOSIS — N12 Tubulo-interstitial nephritis, not specified as acute or chronic: Secondary | ICD-10-CM | POA: Diagnosis not present

## 2016-07-27 DIAGNOSIS — R1030 Lower abdominal pain, unspecified: Secondary | ICD-10-CM | POA: Diagnosis not present

## 2016-07-27 DIAGNOSIS — R10817 Generalized abdominal tenderness: Secondary | ICD-10-CM | POA: Diagnosis not present

## 2016-07-27 DIAGNOSIS — R188 Other ascites: Secondary | ICD-10-CM | POA: Diagnosis not present

## 2016-07-27 DIAGNOSIS — Z7901 Long term (current) use of anticoagulants: Secondary | ICD-10-CM | POA: Diagnosis not present

## 2016-07-27 DIAGNOSIS — G8929 Other chronic pain: Secondary | ICD-10-CM | POA: Diagnosis present

## 2016-07-27 DIAGNOSIS — R10819 Abdominal tenderness, unspecified site: Secondary | ICD-10-CM

## 2016-07-27 DIAGNOSIS — Z7401 Bed confinement status: Secondary | ICD-10-CM | POA: Diagnosis not present

## 2016-07-27 DIAGNOSIS — R0602 Shortness of breath: Secondary | ICD-10-CM | POA: Diagnosis not present

## 2016-07-27 DIAGNOSIS — E860 Dehydration: Secondary | ICD-10-CM | POA: Insufficient documentation

## 2016-07-27 DIAGNOSIS — R569 Unspecified convulsions: Secondary | ICD-10-CM | POA: Diagnosis not present

## 2016-07-27 DIAGNOSIS — I10 Essential (primary) hypertension: Secondary | ICD-10-CM | POA: Diagnosis present

## 2016-07-27 DIAGNOSIS — N289 Disorder of kidney and ureter, unspecified: Secondary | ICD-10-CM | POA: Diagnosis not present

## 2016-07-27 DIAGNOSIS — C259 Malignant neoplasm of pancreas, unspecified: Secondary | ICD-10-CM | POA: Diagnosis not present

## 2016-07-27 DIAGNOSIS — R140 Abdominal distension (gaseous): Secondary | ICD-10-CM | POA: Diagnosis not present

## 2016-07-27 DIAGNOSIS — R791 Abnormal coagulation profile: Secondary | ICD-10-CM | POA: Diagnosis present

## 2016-07-27 DIAGNOSIS — J942 Hemothorax: Secondary | ICD-10-CM | POA: Diagnosis not present

## 2016-07-27 DIAGNOSIS — R933 Abnormal findings on diagnostic imaging of other parts of digestive tract: Secondary | ICD-10-CM | POA: Diagnosis not present

## 2016-07-27 DIAGNOSIS — R1011 Right upper quadrant pain: Secondary | ICD-10-CM | POA: Diagnosis not present

## 2016-07-27 DIAGNOSIS — C787 Secondary malignant neoplasm of liver and intrahepatic bile duct: Secondary | ICD-10-CM | POA: Diagnosis not present

## 2016-07-27 DIAGNOSIS — I8291 Chronic embolism and thrombosis of unspecified vein: Secondary | ICD-10-CM | POA: Diagnosis not present

## 2016-07-27 DIAGNOSIS — R180 Malignant ascites: Secondary | ICD-10-CM | POA: Diagnosis not present

## 2016-07-27 DIAGNOSIS — E43 Unspecified severe protein-calorie malnutrition: Secondary | ICD-10-CM

## 2016-07-27 DIAGNOSIS — C799 Secondary malignant neoplasm of unspecified site: Secondary | ICD-10-CM | POA: Diagnosis not present

## 2016-07-27 DIAGNOSIS — Z515 Encounter for palliative care: Secondary | ICD-10-CM

## 2016-07-27 DIAGNOSIS — D62 Acute posthemorrhagic anemia: Secondary | ICD-10-CM | POA: Diagnosis present

## 2016-07-27 DIAGNOSIS — R740 Nonspecific elevation of levels of transaminase and lactic acid dehydrogenase [LDH]: Secondary | ICD-10-CM | POA: Diagnosis not present

## 2016-07-27 DIAGNOSIS — I82501 Chronic embolism and thrombosis of unspecified deep veins of right lower extremity: Secondary | ICD-10-CM | POA: Diagnosis not present

## 2016-07-27 DIAGNOSIS — Z79899 Other long term (current) drug therapy: Secondary | ICD-10-CM | POA: Diagnosis not present

## 2016-07-27 DIAGNOSIS — R9389 Abnormal findings on diagnostic imaging of other specified body structures: Secondary | ICD-10-CM

## 2016-07-27 DIAGNOSIS — I69351 Hemiplegia and hemiparesis following cerebral infarction affecting right dominant side: Secondary | ICD-10-CM | POA: Diagnosis not present

## 2016-07-27 DIAGNOSIS — J9601 Acute respiratory failure with hypoxia: Secondary | ICD-10-CM | POA: Diagnosis not present

## 2016-07-27 DIAGNOSIS — R1110 Vomiting, unspecified: Secondary | ICD-10-CM | POA: Diagnosis not present

## 2016-07-27 DIAGNOSIS — R1915 Other abnormal bowel sounds: Secondary | ICD-10-CM

## 2016-07-27 DIAGNOSIS — C252 Malignant neoplasm of tail of pancreas: Secondary | ICD-10-CM | POA: Diagnosis not present

## 2016-07-27 DIAGNOSIS — R198 Other specified symptoms and signs involving the digestive system and abdomen: Secondary | ICD-10-CM

## 2016-07-27 DIAGNOSIS — D6489 Other specified anemias: Secondary | ICD-10-CM | POA: Diagnosis present

## 2016-07-27 DIAGNOSIS — Z7982 Long term (current) use of aspirin: Secondary | ICD-10-CM | POA: Diagnosis not present

## 2016-07-27 DIAGNOSIS — R1911 Absent bowel sounds: Secondary | ICD-10-CM

## 2016-07-27 DIAGNOSIS — J9621 Acute and chronic respiratory failure with hypoxia: Secondary | ICD-10-CM | POA: Diagnosis not present

## 2016-07-27 DIAGNOSIS — C7802 Secondary malignant neoplasm of left lung: Secondary | ICD-10-CM | POA: Diagnosis present

## 2016-07-27 HISTORY — DX: Unspecified protein-calorie malnutrition: E46

## 2016-07-27 HISTORY — DX: Other chronic pain: G89.29

## 2016-07-27 HISTORY — DX: Gastro-esophageal reflux disease without esophagitis: K21.9

## 2016-07-27 HISTORY — DX: Acute embolism and thrombosis of unspecified deep veins of unspecified lower extremity: I82.409

## 2016-07-27 HISTORY — DX: Other specified anxiety disorders: F41.8

## 2016-07-27 HISTORY — DX: Major depressive disorder, single episode, unspecified: F32.9

## 2016-07-27 HISTORY — DX: Tubulo-interstitial nephritis, not specified as acute or chronic: N12

## 2016-07-27 HISTORY — DX: Pure hypercholesterolemia, unspecified: E78.00

## 2016-07-27 HISTORY — DX: Depression, unspecified: F32.A

## 2016-07-27 HISTORY — DX: Anxiety disorder, unspecified: F41.9

## 2016-07-27 LAB — URINALYSIS, ROUTINE W REFLEX MICROSCOPIC
Bilirubin Urine: NEGATIVE
Glucose, UA: NEGATIVE mg/dL
HGB URINE DIPSTICK: NEGATIVE
Ketones, ur: NEGATIVE mg/dL
Nitrite: NEGATIVE
PROTEIN: NEGATIVE mg/dL
Specific Gravity, Urine: 1.015 (ref 1.005–1.030)
pH: 5 (ref 5.0–8.0)

## 2016-07-27 LAB — PREGNANCY, URINE: PREG TEST UR: POSITIVE — AB

## 2016-07-27 LAB — PROTIME-INR
INR: 3.9
Prothrombin Time: 39.2 seconds — ABNORMAL HIGH (ref 11.4–15.2)

## 2016-07-27 LAB — HCG, QUANTITATIVE, PREGNANCY: hCG, Beta Chain, Quant, S: 1912 m[IU]/mL — ABNORMAL HIGH (ref <5)

## 2016-07-27 LAB — PHOSPHORUS: Phosphorus: 2.4 mg/dL — ABNORMAL LOW (ref 2.5–4.6)

## 2016-07-27 LAB — MAGNESIUM: Magnesium: 1.4 mg/dL — ABNORMAL LOW (ref 1.7–2.4)

## 2016-07-27 LAB — I-STAT CG4 LACTIC ACID, ED: Lactic Acid, Venous: 1.4 mmol/L (ref 0.5–1.9)

## 2016-07-27 MED ORDER — SODIUM CHLORIDE 0.9 % IV SOLN
INTRAVENOUS | Status: DC
  Administered 2016-07-27 – 2016-07-30 (×7): via INTRAVENOUS

## 2016-07-27 MED ORDER — PANTOPRAZOLE SODIUM 40 MG PO TBEC
40.0000 mg | DELAYED_RELEASE_TABLET | Freq: Two times a day (BID) | ORAL | Status: DC
  Administered 2016-07-27 – 2016-08-01 (×10): 40 mg via ORAL
  Filled 2016-07-27 (×11): qty 1

## 2016-07-27 MED ORDER — POTASSIUM CHLORIDE 10 MEQ/100ML IV SOLN
10.0000 meq | INTRAVENOUS | Status: AC
  Administered 2016-07-27 (×3): 10 meq via INTRAVENOUS
  Filled 2016-07-27 (×3): qty 100

## 2016-07-27 MED ORDER — GABAPENTIN 400 MG PO CAPS
400.0000 mg | ORAL_CAPSULE | Freq: Three times a day (TID) | ORAL | Status: DC
  Administered 2016-07-27 – 2016-08-04 (×20): 400 mg via ORAL
  Filled 2016-07-27 (×20): qty 1

## 2016-07-27 MED ORDER — PANTOPRAZOLE SODIUM 40 MG PO TBEC
40.0000 mg | DELAYED_RELEASE_TABLET | Freq: Two times a day (BID) | ORAL | Status: DC
  Administered 2016-07-27: 40 mg via ORAL

## 2016-07-27 MED ORDER — DULOXETINE HCL 20 MG PO CPEP
20.0000 mg | ORAL_CAPSULE | Freq: Every day | ORAL | Status: DC
  Administered 2016-07-27 – 2016-08-03 (×7): 20 mg via ORAL
  Filled 2016-07-27 (×10): qty 1

## 2016-07-27 MED ORDER — OXYCODONE HCL 5 MG PO TABS
5.0000 mg | ORAL_TABLET | ORAL | Status: DC | PRN
  Administered 2016-07-30 – 2016-08-03 (×4): 5 mg via ORAL
  Filled 2016-07-27 (×4): qty 1

## 2016-07-27 MED ORDER — CEFTRIAXONE SODIUM 1 G IJ SOLR
1.0000 g | Freq: Once | INTRAMUSCULAR | Status: AC
  Administered 2016-07-27: 1 g via INTRAVENOUS
  Filled 2016-07-27: qty 10

## 2016-07-27 MED ORDER — ONDANSETRON HCL 4 MG/2ML IJ SOLN: 4.0000 mg | Freq: Four times a day (QID) | INTRAMUSCULAR | Status: DC | PRN

## 2016-07-27 MED ORDER — PHENYTOIN SODIUM EXTENDED 100 MG PO CAPS
100.0000 mg | ORAL_CAPSULE | Freq: Three times a day (TID) | ORAL | Status: DC
  Administered 2016-07-27 – 2016-08-04 (×21): 100 mg via ORAL
  Filled 2016-07-27 (×23): qty 1

## 2016-07-27 MED ORDER — CLONIDINE HCL 0.2 MG PO TABS
0.2000 mg | ORAL_TABLET | Freq: Three times a day (TID) | ORAL | Status: DC
  Administered 2016-07-27 – 2016-08-01 (×12): 0.2 mg via ORAL
  Filled 2016-07-27 (×15): qty 1

## 2016-07-27 MED ORDER — ENSURE ENLIVE PO LIQD
237.0000 mL | Freq: Two times a day (BID) | ORAL | Status: DC
  Administered 2016-07-27: 237 mL via ORAL
  Filled 2016-07-27: qty 237

## 2016-07-27 MED ORDER — ASPIRIN EC 81 MG PO TBEC
81.0000 mg | DELAYED_RELEASE_TABLET | Freq: Every day | ORAL | Status: DC
  Administered 2016-07-27: 81 mg via ORAL

## 2016-07-27 MED ORDER — CLONIDINE HCL 0.2 MG PO TABS
0.2000 mg | ORAL_TABLET | Freq: Three times a day (TID) | ORAL | Status: DC
  Administered 2016-07-27: 0.2 mg via ORAL

## 2016-07-27 MED ORDER — ONDANSETRON HCL 4 MG/2ML IJ SOLN
4.0000 mg | Freq: Once | INTRAMUSCULAR | Status: AC
  Administered 2016-07-27: 4 mg via INTRAVENOUS
  Filled 2016-07-27: qty 2

## 2016-07-27 MED ORDER — ATORVASTATIN CALCIUM 40 MG PO TABS
40.0000 mg | ORAL_TABLET | Freq: Every day | ORAL | Status: DC
  Administered 2016-07-27 – 2016-08-02 (×6): 40 mg via ORAL
  Filled 2016-07-27 (×6): qty 1

## 2016-07-27 MED ORDER — SODIUM CHLORIDE 0.9 % IV BOLUS (SEPSIS)
1000.0000 mL | Freq: Once | INTRAVENOUS | Status: AC
  Administered 2016-07-27: 1000 mL via INTRAVENOUS

## 2016-07-27 MED ORDER — ACETAMINOPHEN 325 MG PO TABS
650.0000 mg | ORAL_TABLET | Freq: Four times a day (QID) | ORAL | Status: DC | PRN
  Administered 2016-07-31: 650 mg via ORAL
  Filled 2016-07-27: qty 2

## 2016-07-27 MED ORDER — ASPIRIN EC 81 MG PO TBEC
81.0000 mg | DELAYED_RELEASE_TABLET | Freq: Every day | ORAL | Status: DC
  Administered 2016-07-28 – 2016-08-01 (×5): 81 mg via ORAL
  Filled 2016-07-27 (×6): qty 1

## 2016-07-27 MED ORDER — SODIUM CHLORIDE 0.9 % IV SOLN: INTRAVENOUS | Status: DC

## 2016-07-27 MED ORDER — DEXTROSE 5 % IV SOLN
1.0000 g | INTRAVENOUS | Status: DC
  Administered 2016-07-28 – 2016-07-29 (×2): 1 g via INTRAVENOUS
  Filled 2016-07-27 (×2): qty 10

## 2016-07-27 MED ORDER — PROMETHAZINE HCL 25 MG/ML IJ SOLN
12.5000 mg | Freq: Four times a day (QID) | INTRAMUSCULAR | Status: DC | PRN
  Administered 2016-08-01: 12.5 mg via INTRAVENOUS
  Filled 2016-07-27: qty 1

## 2016-07-27 MED ORDER — AMITRIPTYLINE HCL 25 MG PO TABS
25.0000 mg | ORAL_TABLET | Freq: Every day | ORAL | Status: DC
  Administered 2016-07-27 – 2016-08-03 (×7): 25 mg via ORAL
  Filled 2016-07-27 (×8): qty 1

## 2016-07-27 MED ORDER — PHENYTOIN SODIUM EXTENDED 100 MG PO CAPS
100.0000 mg | ORAL_CAPSULE | Freq: Three times a day (TID) | ORAL | Status: DC
  Administered 2016-07-27: 100 mg via ORAL

## 2016-07-27 MED ORDER — FENTANYL CITRATE (PF) 100 MCG/2ML IJ SOLN
50.0000 ug | Freq: Once | INTRAMUSCULAR | Status: AC
  Administered 2016-07-27: 50 ug via INTRAVENOUS
  Filled 2016-07-27: qty 2

## 2016-07-27 MED ORDER — HYDRALAZINE HCL 20 MG/ML IJ SOLN: 5.0000 mg | Freq: Four times a day (QID) | INTRAMUSCULAR | Status: DC | PRN

## 2016-07-27 MED ORDER — ONDANSETRON HCL 4 MG PO TABS
4.0000 mg | ORAL_TABLET | Freq: Four times a day (QID) | ORAL | Status: DC | PRN
  Administered 2016-07-31 – 2016-08-01 (×2): 4 mg via ORAL
  Filled 2016-07-27 (×2): qty 1

## 2016-07-27 MED ORDER — MAGNESIUM SULFATE 2 GM/50ML IV SOLN
2.0000 g | Freq: Once | INTRAVENOUS | Status: AC
  Administered 2016-07-27: 2 g via INTRAVENOUS
  Filled 2016-07-27: qty 50

## 2016-07-27 MED ORDER — MORPHINE SULFATE (PF) 4 MG/ML IV SOLN
4.0000 mg | INTRAVENOUS | Status: DC | PRN
  Administered 2016-07-27: 4 mg via INTRAVENOUS
  Filled 2016-07-27 (×2): qty 1

## 2016-07-27 MED ORDER — ACETAMINOPHEN 650 MG RE SUPP: 650.0000 mg | Freq: Four times a day (QID) | RECTAL | Status: DC | PRN

## 2016-07-27 MED ORDER — GABAPENTIN 400 MG PO CAPS: 400.0000 mg | ORAL_CAPSULE | Freq: Three times a day (TID) | ORAL | Status: DC

## 2016-07-27 MED ORDER — METOPROLOL TARTRATE 25 MG PO TABS: 100.0000 mg | ORAL_TABLET | Freq: Two times a day (BID) | ORAL | Status: DC

## 2016-07-27 MED ORDER — METOPROLOL TARTRATE 100 MG PO TABS
100.0000 mg | ORAL_TABLET | Freq: Two times a day (BID) | ORAL | Status: DC
  Administered 2016-07-27 – 2016-07-29 (×5): 100 mg via ORAL
  Filled 2016-07-27 (×7): qty 1

## 2016-07-27 MED ORDER — BOOST / RESOURCE BREEZE PO LIQD
1.0000 | Freq: Three times a day (TID) | ORAL | Status: DC
  Administered 2016-07-28: 1 via ORAL

## 2016-07-27 NOTE — H&P (Signed)
History and Physical    ILLONA Lynch FBP:[id number] DOB: 1960-02-19 DOA: 07/19/2016  PCP: Charolette Forward, MD Patient coming from: home  Chief Complaint: abd pain  HPI: Ann Lynch is a 56 y.o. female with medical history significant of seizures, DVT on Coumadin, CVA with residual right-sided weakness, hypertension. Patient presenting with several day history of nausea and vomiting. Patient states that she is unable to keep down virtually any solids or liquids since onset of symptoms. Associated with abdominal pain and concerns from patient she is experiencing a UTI. Says the symptoms are similar to her typical UTI. Of note patient's INR was 6 on 07/23/2016 and has not taken her Coumadin since. Pt denies being sexually active for the past 9 years.  ED Course: obvjective findings below  Review of Systems: As per HPI otherwise all other systems reviewed and are negative   Past Medical History:  Diagnosis Date  . Chronic pain   . Depression with anxiety   . DVT (deep venous thrombosis) ([place])   . GERD (gastroesophageal reflux disease)   . Hypertension   . Protein calorie malnutrition ([place])   . Stroke [facility])     Past Surgical History:  Procedure Laterality Date  . TRACHEOSTOMY      Social History   Social History  . Marital status: Single    Spouse name: N/A  . Number of children: N/A  . Years of education: N/A   Occupational History  . Not on file.   Social History Main Topics  . Smoking status: Never Smoker  . Smokeless tobacco: Never Used  . Alcohol use No  . Drug use: Unknown  . Sexual activity: Not on file   Other Topics Concern  . Not on file   Social History Narrative  . No narrative on file    No Known Allergies  Family History  Problem Relation Age of Onset  . Hypertension Mother   . Diabetes Mellitus II Mother   . Hypertension Father       Prior to Admission medications   Medication Sig Start Date End Date Taking? Authorizing  Provider  amitriptyline (ELAVIL) 25 MG tablet Take 1 tablet (25 mg total) by mouth at bedtime. 06/25/16  Yes Love, Ivan Anchors, PA-C  aspirin EC 81 MG tablet Take 81 mg by mouth daily.   Yes [provider]  atorvastatin (LIPITOR) 40 MG tablet Take 1 tablet (40 mg total) by mouth at bedtime. 06/25/16  Yes Love, Ivan Anchors, PA-C  cloNIDine (CATAPRES) 0.2 MG tablet Take 1 tablet (0.2 mg total) by mouth 3 (three) times daily. 06/25/16  Yes Love, Ivan Anchors, PA-C  DULoxetine (CYMBALTA) 20 MG capsule Take 1 capsule (20 mg total) by mouth at bedtime. 06/25/16  Yes Love, Ivan Anchors, PA-C  feeding supplement, ENSURE ENLIVE, (ENSURE ENLIVE) LIQD Take 237 mLs by mouth 2 (two) times daily between meals. 06/17/16  Yes Reyne Dumas, MD  folic acid (FOLVITE) 1 MG tablet Take 1 tablet (1 mg total) by mouth daily. 06/25/16  Yes Love, Ivan Anchors, PA-C  gabapentin (NEURONTIN) 400 MG capsule Take 1 capsule (400 mg total) by mouth 3 (three) times daily. 06/25/16  Yes Love, Ivan Anchors, PA-C  metoprolol tartrate (LOPRESSOR) 100 MG tablet Take 1 tablet (100 mg total) by mouth 2 (two) times daily. 06/25/16  Yes Love, Ivan Anchors, PA-C  pantoprazole (PROTONIX) 40 MG tablet TAKE 1 TABLET BY MOUTH TWICE DAILY 07/26/16  Yes [name], MD  phenytoin (DILANTIN) 100 MG  ER capsule Take 1 capsule (100 mg total) by mouth 3 (three) times daily. 06/25/16  Yes Love, Ivan Anchors, PA-C  polyethylene glycol (MIRALAX / GLYCOLAX) packet Take 17 g by mouth daily. Patient taking differently: Take 17 g by mouth daily as needed for mild constipation.  06/25/16  Yes Love, Ivan Anchors, PA-C  senna-docusate (SENOKOT-S) 8.6-50 MG tablet Take 1 tablet by mouth 2 (two) times daily. Patient taking differently: Take 1 tablet by mouth 2 (two) times daily as needed for mild constipation.  06/25/16  Yes Love, Ivan Anchors, PA-C  warfarin (COUMADIN) 1 MG tablet Take 0.5 tablets (0.5 mg total) by mouth one time only at 6 PM. 06/27/16  Yes [name], MD    Physical Exam: Vitals:    08/04/2016 0520 07/23/2016 0700 08/09/2016 0800 07/26/2016 0900  BP: 118/83 118/76 117/83 130/85  Pulse: (!) 125 (!) 126 (!) 129   Resp: (!) 22 19 (!) 22 (!) 21  Temp:      TempSrc:      SpO2: 90% 92% 95%   Weight:      Height:         General: frail appearing. Resting in bed.  Eyes: normal lids, iris ENT: dry mm,  grossly normal hearing, lips & tongue, Neck:  no LAD, masses or thyromegaly Cardiovascular: III/VI systolic murmur, RRR,  No LE edema.  Respiratory:  CTA bilaterally, no w/r/r. Normal respiratory effort. Abdomen: Mildly distended, suprapubic tenderness to palpation, bilateral CVA tenderness. Skin:  no rash or induration seen on limited exam Musculoskeletal: Diffuse muscle wasting throughout. Global weakness with slight worsening on right. No bony abnormality Psychiatric:  grossly normal mood and affect, speech fluent and appropriate, AOx3 Neurologic:  CN 2-12 grossly intact, sensation intact  Labs on Admission: I have personally reviewed following labs and imaging studies  CBC:  Recent Labs Lab 07/26/16 1950  WBC 16.3*  NEUTROABS 13.3*  HGB 7.8*  HCT 24.1*  MCV 92.0  PLT 353*   Basic Metabolic Panel:  Recent Labs Lab 07/26/16 1950  NA 132*  K 3.1*  CL 91*  CO2 26  GLUCOSE 129*  BUN 63*  CREATININE 2.19*  CALCIUM 8.5*   GFR: Estimated Creatinine Clearance: 28.9 mL/min (A) (by C-G formula based on SCr of 2.19 mg/dL (H)). Liver Function Tests:  Recent Labs Lab 07/26/16 1950  AST 89*  ALT 27  ALKPHOS 140*  BILITOT 1.3*  PROT 8.6*  ALBUMIN 2.2*   No results for input(s): LIPASE, AMYLASE in the last 168 hours. No results for input(s): AMMONIA in the last 168 hours. Coagulation Profile:  Recent Labs Lab 08/08/2016 0408  INR 3.90   Cardiac Enzymes: No results for input(s): CKTOTAL, CKMB, CKMBINDEX, TROPONINI in the last 168 hours. BNP (last 3 results) No results for input(s): PROBNP in the last 8760 hours. HbA1C: No results for input(s):  HGBA1C in the last 72 hours. CBG: No results for input(s): GLUCAP in the last 168 hours. Lipid Profile: No results for input(s): CHOL, HDL, LDLCALC, TRIG, CHOLHDL, LDLDIRECT in the last 72 hours. Thyroid Function Tests: No results for input(s): TSH, T4TOTAL, FREET4, T3FREE, THYROIDAB in the last 72 hours. Anemia Panel: No results for input(s): VITAMINB12, FOLATE, FERRITIN, TIBC, IRON, RETICCTPCT in the last 72 hours. Urine analysis:    Component Value Date/Time   COLORURINE AMBER (A) 07/22/2016 0258   APPEARANCEUR HAZY (A) 08/04/2016 0258   LABSPEC 1.015 07/25/2016 0258   PHURINE 5.0 08/03/2016 [place] 08/15/2016 0258  HGBUR NEGATIVE 07/30/2016 0258   BILIRUBINUR NEGATIVE 07/25/2016 0258   KETONESUR NEGATIVE 08/10/2016 0258   PROTEINUR NEGATIVE 07/24/2016 0258   UROBILINOGEN 0.2 06/08/2013 2308   NITRITE NEGATIVE 07/20/2016 0258   LEUKOCYTESUR MODERATE (A) 08/16/2016 0258    Creatinine Clearance: Estimated Creatinine Clearance: 28.9 mL/min (A) (by C-G formula based on SCr of 2.19 mg/dL (H)).  Sepsis Labs: @LABRCNTIP (procalcitonin:4,lacticidven:4) )No results found for this or any previous visit (from the past 240 hour(s)).   Radiological Exams on Admission: US Transvaginal Non-ob  Result Date: 08/02/2016 CLINICAL DATA:  Initial evaluation for acute lower pelvic pain, elevated beta HCG, question ovarian mass. EXAM: TRANSABDOMINAL AND TRANSVAGINAL ULTRASOUND OF PELVIS TECHNIQUE: Both transabdominal and transvaginal ultrasound examinations of the pelvis were performed. Transabdominal technique was performed for global imaging of the pelvis including uterus, ovaries, adnexal regions, and pelvic cul-de-sac. It was necessary to proceed with endovaginal exam following the transabdominal exam to visualize the uterus and ovaries. COMPARISON:  Prior CT from 06/12/2016. FINDINGS: Uterus Measurements: 9.2 x 3.9 x 4.3 cm. Uterus heterogeneous without focal mass. Uterus is  anteverted. Endometrium Thickness: 5 mm.  No focal abnormality visualized. Right ovary Measurements: 2.6 x 2.0 x 2.2 cm. Normal appearance/no adnexal mass. Left ovary Not visualized.  No adnexal mass. Other findings Large volume free fluid within the pelvis. IMPRESSION: 1. Normal sonographic appearance of the right ovary, with nonvisualization of the left ovary. No adnexal mass evident by sonography. 2. Large volume free fluid within the pelvis. 3. Normal sonographic appearance of the uterus. Electronically Signed   By: Jeannine Boga M.D.   On: 08/08/2016 06:20   US Pelvis Complete  Result Date: 08/08/2016 CLINICAL DATA:  Initial evaluation for acute lower pelvic pain, elevated beta HCG, question ovarian mass. EXAM: TRANSABDOMINAL AND TRANSVAGINAL ULTRASOUND OF PELVIS TECHNIQUE: Both transabdominal and transvaginal ultrasound examinations of the pelvis were performed. Transabdominal technique was performed for global imaging of the pelvis including uterus, ovaries, adnexal regions, and pelvic cul-de-sac. It was necessary to proceed with endovaginal exam following the transabdominal exam to visualize the uterus and ovaries. COMPARISON:  Prior CT from 06/12/2016. FINDINGS: Uterus Measurements: 9.2 x 3.9 x 4.3 cm. Uterus heterogeneous without focal mass. Uterus is anteverted. Endometrium Thickness: 5 mm.  No focal abnormality visualized. Right ovary Measurements: 2.6 x 2.0 x 2.2 cm. Normal appearance/no adnexal mass. Left ovary Not visualized.  No adnexal mass. Other findings Large volume free fluid within the pelvis. IMPRESSION: 1. Normal sonographic appearance of the right ovary, with nonvisualization of the left ovary. No adnexal mass evident by sonography. 2. Large volume free fluid within the pelvis. 3. Normal sonographic appearance of the uterus. Electronically Signed   By: Jeannine Boga M.D.   On: 08/07/2016 06:20   US Renal  Result Date: 07/26/2016 CLINICAL DATA:  Acute kidney injury.   Pyelonephritis EXAM: RENAL / URINARY TRACT ULTRASOUND COMPLETE COMPARISON:  06/12/2016 renal ultrasound FINDINGS: Right Kidney: Length: 9 cm (likely underestimated due to partial visualization of the lower pole). Echogenic, consistent with medical renal disease. No hydronephrosis. Left Kidney: Length: 11 cm. Echogenic, consistent with medical renal disease. No hydronephrosis. Bladder: Appears normal for degree of bladder distention. Small ascites.  Pleural effusion on the right at least. IMPRESSION: 1. No hydronephrosis. 2. Medical renal disease. 3. Small ascites that is new from 06/12/2016 sonogram. Right pleural effusion. Electronically Signed   By: Monte Fantasia M.D.   On: 08/13/2016 10:07    EKG: pending  Assessment/Plan Active Problems:   AKI (acute  kidney injury) ([place])   Seizures ([place])   Supratherapeutic INR   Pyelonephritis   Intractable nausea and vomiting   Chronic deep vein thrombosis (DVT) ([place])   Positive pregnancy test   Hypokalemia   Severe protein-calorie malnutrition [name]: less than 60% of standard weight) ([facility])   History of CVA (cerebrovascular accident)    Intractable N/V: likely from UTI and possible pyelo. UA grossly normal. Right CVA tenderness noted. Afebrile the patient does endorse subjective fevers at home. - Phenergan/Zofran - Aggressive IV fluids - Rocephin - Follow-up urine culture - Renal ultrasound. (Pelvic ultrasound without evidence of proximal of conception but large amount of free fluid in the pelvis noted. Radiology not comfortable with noncontrast CT of abdomen and pelvis due to positive pregnancy test though this is likely false positive).  Positive pregnancy test: Serum quantitative beta hCG 1912 which is consistent with 6 weeks with the pregnancy. Patient denies any sexual activity since 2009. Transvaginal ultrasound without evidence of products of conception. Likely false positive - Beta hCG in a.m.  Acute kidney injury: 22.19. Baseline 1.0.  Likely secondary to intractable nausea vomiting and fluid loss. He also be secondary to renal injury from infectious process. - Ultrasound as above - IVF - BMP in a.m.  Hypokalemia: Secondary to renal insufficiency and GI loss. - Magnesium, potassium  Chronic DVT: on coumadin. Last INR >6. Pt held coumadin for 4 days since that time - Pharmacy for coumadin dosing - coags  HTN: - Continue metoprolol, clonidine  CAD: - Continue statin, ASA  Depression/anxiety: Continue Elavil, Cymbalta  Chronic pain:  Continue Neurontin  GERD: -  a PPI   Seizures: - Continue Dilantin  Protein calorie malnutrition: Patient essentially bedbound requires a great deal of physical assistance. - Continue home ensure - Pre-albumin - PT/OT   DVT prophylaxis: coumadin  Code Status: full  Family Communication: none  Disposition Plan: pending workup and improvement of possible Pyelo  Consults called: none  Admission status: inpt    Sarahgrace Broman J MD [facility]  If 7PM-7AM, please contact night-coverage [email or web address] Password [email or web address]  08/16/2016, 10:32 AM

## 2016-07-27 NOTE — ED Notes (Signed)
PTs IV came out of patients arm, no bleeding present, site clean and dry.

## 2016-07-27 NOTE — ED Provider Notes (Signed)
[facility] DEPT Provider Note   CSN: [id number] Arrival date & time: 07/26/16  1719     History   Chief Complaint Chief Complaint  Patient presents with  . Emesis    HPI Ann Lynch is a 56 y.o. female.  Patient with past medical history of seizures, DVT, treated with Coumadin, prior stroke with residual right-sided hemiplegia, presents to the emergency department with a chief complaint of decreased oral intake, abdominal pain, and recent UTI. She lives with her mother. Her mother reports that she has had weight loss, anorexia, and abdominal pain. She is concerned about return of her UTI. Patient reports associated vomiting. She states that she has been unable to keep anything down for the past several days. Of note, patient is anticoagulated on Coumadin for her DVT, on Friday her INR was greater than 6, and she was instructed to hold her Coumadin.   The history is provided by the patient. No language interpreter was used.    Past Medical History:  Diagnosis Date  . DVT (deep venous thrombosis) ([place])   . Hypertension   . Stroke [facility])     Patient Active Problem List   Diagnosis Date Noted  . Acute blood loss anemia   . Seizures ([place])   . Benign essential HTN   . Chronic pain syndrome   . Supratherapeutic INR   . Abdominal discomfort   . Debilitated 06/17/2016  . DVT of lower extremity, bilateral ([place]) 06/17/2016  . Sepsis due to cellulitis ([place]) 06/11/2016  . Aphasia 06/08/2013  . Slurred speech 06/08/2013  . AKI (acute kidney injury) ([place]) 06/08/2013  . CVA, old, hemiparesis ([place]) 06/08/2013  . HTN (hypertension) 06/08/2013    Past Surgical History:  Procedure Laterality Date  . TRACHEOSTOMY      OB History    No data available       Home Medications    Prior to Admission medications   Medication Sig Start Date End Date Taking? Authorizing Provider  amitriptyline (ELAVIL) 25 MG tablet Take 1 tablet (25 mg total) by mouth at bedtime. 06/25/16    Love, Ivan Anchors, PA-C  aspirin EC 81 MG tablet Take 81 mg by mouth daily.    [provider]  atorvastatin (LIPITOR) 40 MG tablet Take 1 tablet (40 mg total) by mouth at bedtime. 06/25/16   Love, Ivan Anchors, PA-C  cloNIDine (CATAPRES) 0.2 MG tablet Take 1 tablet (0.2 mg total) by mouth 3 (three) times daily. 06/25/16   Love, Ivan Anchors, PA-C  DULoxetine (CYMBALTA) 20 MG capsule Take 1 capsule (20 mg total) by mouth at bedtime. 06/25/16   Love, Ivan Anchors, PA-C  feeding supplement, ENSURE ENLIVE, (ENSURE ENLIVE) LIQD Take 237 mLs by mouth 2 (two) times daily between meals. 06/17/16   Reyne Dumas, MD  folic acid (FOLVITE) 1 MG tablet Take 1 tablet (1 mg total) by mouth daily. 06/25/16   Love, Ivan Anchors, PA-C  gabapentin (NEURONTIN) 400 MG capsule Take 1 capsule (400 mg total) by mouth 3 (three) times daily. 06/25/16   Love, Ivan Anchors, PA-C  metoprolol tartrate (LOPRESSOR) 100 MG tablet Take 1 tablet (100 mg total) by mouth 2 (two) times daily. 06/25/16   Love, Ivan Anchors, PA-C  oxyCODONE-acetaminophen (PERCOCET/ROXICET) 5-325 MG tablet Take 0.5-1 tablets by mouth every 12 (twelve) hours as needed for severe pain. 06/25/16   Love, Ivan Anchors, PA-C  pantoprazole (PROTONIX) 40 MG tablet TAKE 1 TABLET BY MOUTH TWICE DAILY 07/26/16   [name], MD  phenytoin (DILANTIN) 100 MG ER capsule Take 1 capsule (100 mg total) by mouth 3 (three) times daily. 06/25/16   Love, Ivan Anchors, PA-C  polyethylene glycol (MIRALAX / GLYCOLAX) packet Take 17 g by mouth daily. 06/25/16   Love, Ivan Anchors, PA-C  senna-docusate (SENOKOT-S) 8.6-50 MG tablet Take 1 tablet by mouth 2 (two) times daily. 06/25/16   Love, Ivan Anchors, PA-C  warfarin (COUMADIN) 1 MG tablet Take 0.5 tablets (0.5 mg total) by mouth one time only at 6 PM. 06/27/16   [name], MD    Family History Family History  Problem Relation Age of Onset  . Hypertension Mother   . Diabetes Mellitus II Mother   . Hypertension Father     Social History Social History    Substance Use Topics  . Smoking status: Never Smoker  . Smokeless tobacco: Never Used  . Alcohol use No     Allergies   Patient has no known allergies.   Review of Systems Review of Systems  All other systems reviewed and are negative.    Physical Exam Updated Vital Signs BP 96/67 (BP Location: Left Arm)   Pulse (!) 132   Temp 97.8 F (36.6 C) (Oral)   Resp 18   Ht 5\' 7"  (1.702 m)   Wt 68 kg (150 lb)   SpO2 96%   BMI 23.49 kg/m   Physical Exam  Constitutional: She is oriented to person, place, and time. She appears well-developed and well-nourished.  HENT:  Head: Normocephalic and atraumatic.  Eyes: Conjunctivae and EOM are normal. Pupils are equal, round, and reactive to light.  Neck: Normal range of motion. Neck supple.  Cardiovascular: Normal rate and regular rhythm.  Exam reveals no gallop and no friction rub.   No murmur heard. Pulmonary/Chest: Effort normal and breath sounds normal. No respiratory distress. She has no wheezes. She has no rales. She exhibits no tenderness.  Abdominal: Soft. Bowel sounds are normal. She exhibits no distension and no mass. There is no tenderness. There is no rebound and no guarding.  Musculoskeletal: Normal range of motion. She exhibits no edema or tenderness.  Neurological: She is alert and oriented to person, place, and time.  Skin: Skin is warm and dry.  Psychiatric: She has a normal mood and affect. Her behavior is normal. Judgment and thought content normal.  Nursing note and vitals reviewed.    ED Treatments / Results  Labs (all labs ordered are listed, but only abnormal results are displayed) Labs Reviewed  COMPREHENSIVE METABOLIC PANEL - Abnormal; Notable for the following:       Result Value   Sodium 132 (*)    Potassium 3.1 (*)    Chloride 91 (*)    Glucose, Bld 129 (*)    BUN 63 (*)    Creatinine, Ser 2.19 (*)    Calcium 8.5 (*)    Total Protein 8.6 (*)    Albumin 2.2 (*)    AST 89 (*)    Alkaline  Phosphatase 140 (*)    Total Bilirubin 1.3 (*)    GFR calc non Af Amer 24 (*)    GFR calc Af Amer 28 (*)    All other components within normal limits  CBC WITH DIFFERENTIAL/PLATELET - Abnormal; Notable for the following:    WBC 16.3 (*)    RBC 2.62 (*)    Hemoglobin 7.8 (*)    HCT 24.1 (*)    RDW 18.1 (*)    Platelets 454 (*)  Neutro Abs 13.3 (*)    Monocytes Absolute 1.2 (*)    All other components within normal limits  URINALYSIS, ROUTINE W REFLEX MICROSCOPIC - Abnormal; Notable for the following:    Color, Urine AMBER (*)    APPearance HAZY (*)    Leukocytes, UA MODERATE (*)    Bacteria, UA MANY (*)    Squamous Epithelial / LPF 0-5 (*)    All other components within normal limits  PROTIME-INR - Abnormal; Notable for the following:    Prothrombin Time 39.2 (*)    All other components within normal limits  PREGNANCY, URINE - Abnormal; Notable for the following:    Preg Test, Ur POSITIVE (*)    All other components within normal limits  HCG, QUANTITATIVE, PREGNANCY - Abnormal; Notable for the following:    hCG, Beta Chain, Quant, S 1,912 (*)    All other components within normal limits  I-STAT BETA HCG BLOOD, ED ([facility], WL, AP ONLY) - Abnormal; Notable for the following:    I-stat hCG, quantitative 1,253.5 (*)    All other components within normal limits  I-STAT CG4 LACTIC ACID, ED  I-STAT CG4 LACTIC ACID, ED    EKG  EKG Interpretation None       Radiology US Transvaginal Non-ob  Result Date: 08/02/2016 CLINICAL DATA:  Initial evaluation for acute lower pelvic pain, elevated beta HCG, question ovarian mass. EXAM: TRANSABDOMINAL AND TRANSVAGINAL ULTRASOUND OF PELVIS TECHNIQUE: Both transabdominal and transvaginal ultrasound examinations of the pelvis were performed. Transabdominal technique was performed for global imaging of the pelvis including uterus, ovaries, adnexal regions, and pelvic cul-de-sac. It was necessary to proceed with endovaginal exam following the  transabdominal exam to visualize the uterus and ovaries. COMPARISON:  Prior CT from 06/12/2016. FINDINGS: Uterus Measurements: 9.2 x 3.9 x 4.3 cm. Uterus heterogeneous without focal mass. Uterus is anteverted. Endometrium Thickness: 5 mm.  No focal abnormality visualized. Right ovary Measurements: 2.6 x 2.0 x 2.2 cm. Normal appearance/no adnexal mass. Left ovary Not visualized.  No adnexal mass. Other findings Large volume free fluid within the pelvis. IMPRESSION: 1. Normal sonographic appearance of the right ovary, with nonvisualization of the left ovary. No adnexal mass evident by sonography. 2. Large volume free fluid within the pelvis. 3. Normal sonographic appearance of the uterus. Electronically Signed   By: Jeannine Boga M.D.   On: 07/29/2016 06:20   US Pelvis Complete  Result Date: 07/26/2016 CLINICAL DATA:  Initial evaluation for acute lower pelvic pain, elevated beta HCG, question ovarian mass. EXAM: TRANSABDOMINAL AND TRANSVAGINAL ULTRASOUND OF PELVIS TECHNIQUE: Both transabdominal and transvaginal ultrasound examinations of the pelvis were performed. Transabdominal technique was performed for global imaging of the pelvis including uterus, ovaries, adnexal regions, and pelvic cul-de-sac. It was necessary to proceed with endovaginal exam following the transabdominal exam to visualize the uterus and ovaries. COMPARISON:  Prior CT from 06/12/2016. FINDINGS: Uterus Measurements: 9.2 x 3.9 x 4.3 cm. Uterus heterogeneous without focal mass. Uterus is anteverted. Endometrium Thickness: 5 mm.  No focal abnormality visualized. Right ovary Measurements: 2.6 x 2.0 x 2.2 cm. Normal appearance/no adnexal mass. Left ovary Not visualized.  No adnexal mass. Other findings Large volume free fluid within the pelvis. IMPRESSION: 1. Normal sonographic appearance of the right ovary, with nonvisualization of the left ovary. No adnexal mass evident by sonography. 2. Large volume free fluid within the pelvis. 3.  Normal sonographic appearance of the uterus. Electronically Signed   By: Jeannine Boga M.D.   On: 07/30/2016 06:20  Procedures Procedures (including critical care time)  Medications Ordered in ED Medications  sodium chloride 0.9 % bolus 1,000 mL (not administered)     Initial Impression / Assessment and Plan / ED Course  I have reviewed the triage vital signs and the nursing notes.  Pertinent labs & imaging results that were available during my care of the patient were reviewed by me and considered in my medical decision making (see chart for details).     Patient with nausea, vomiting, decreased oral intake, anorexia, weight loss, and abdominal pain. Laboratory workup seems consistent with some dehydration. We'll give fluids. Also check urinalysis for UTI. Interestingly, i-STAT hCG is elevated, will check a normal quantitative hCG. Patient is not sexually active. She is perimenopausal. I discussed this with Dr. Kathrynn Humble, who states that she will need follow-up for this.  Patient still having some vomiting.  Could be early pyelo.  As she is not tolerating oral intake, will plan for admission.    [place] of visualized pelvic anatomy is normal, unclear why her HCG is elevated at this time.  There is large volume of free fluid in pelvis.   No trauma.  No bruising.   7:04 AM Appreciate Dr. Marily Memos for bringing the patient into the hospital.  Final Clinical Impressions(s) / ED Diagnoses   Final diagnoses:  Abdominal pain  Dehydration    New Prescriptions New Prescriptions   No medications on file     Montine Circle, Hershal Coria 08/11/2016 [place], [place], MD 07/28/16 [phone]

## 2016-07-27 NOTE — Progress Notes (Signed)
Pt admitted to the unit at 1735. Pt mental status is A&O x 4. Pt oriented to room, staff, and call bell. Skin is intact except where otherwise charted. Full assessment charted in [facility]. Call bell within reach. Visitor guidelines reviewed w/ pt and/or family.

## 2016-07-27 NOTE — ED Notes (Addendum)
Pt states she cannot take medication by mouth due to aspiration risk.

## 2016-07-27 NOTE — Progress Notes (Signed)
ANTICOAGULATION CONSULT NOTE  Pharmacy Consult for warfarin Indication: DVT   Assessment: 65 yof with hx of DVT on warfarin PTA. Last outpatient INR>6 and pt held warfarin x 4 days PTA. INR down to 3.9 today. CBC pending. No bleed documented.  PTA warfarin dose: 0.5mg  daily (last dose 07/22/16 PTA) per med rec  Goal of Therapy:  INR 2-3 Monitor platelets by anticoagulation protocol: Yes   Plan:  Continue to hold warfarin tonight Daily INR Monitor CBC, s/sx bleeding  Elicia Lamp, PharmD, BCPS Clinical Pharmacist 08/10/2016 10:55 AM

## 2016-07-27 NOTE — ED Notes (Signed)
Attempted IV x2 w/ no success. Janett Billow, RN attempted x2 w/ no success as well. IVT consult in place.

## 2016-07-28 ENCOUNTER — Inpatient Hospital Stay (HOSPITAL_COMMUNITY): Payer: Medicare HMO

## 2016-07-28 DIAGNOSIS — R140 Abdominal distension (gaseous): Secondary | ICD-10-CM

## 2016-07-28 LAB — CBC
HCT: 22.3 % — ABNORMAL LOW (ref 36.0–46.0)
Hemoglobin: 7 g/dL — ABNORMAL LOW (ref 12.0–15.0)
MCH: 29.9 pg (ref 26.0–34.0)
MCHC: 31.4 g/dL (ref 30.0–36.0)
MCV: 95.3 fL (ref 78.0–100.0)
PLATELETS: 483 10*3/uL — AB (ref 150–400)
RBC: 2.34 MIL/uL — AB (ref 3.87–5.11)
RDW: 19 % — AB (ref 11.5–15.5)
WBC: 12.2 10*3/uL — AB (ref 4.0–10.5)

## 2016-07-28 LAB — COMPREHENSIVE METABOLIC PANEL
ALBUMIN: 1.8 g/dL — AB (ref 3.5–5.0)
ALT: 32 U/L (ref 14–54)
AST: 115 U/L — AB (ref 15–41)
Alkaline Phosphatase: 112 U/L (ref 38–126)
Anion gap: 12 (ref 5–15)
BUN: 42 mg/dL — AB (ref 6–20)
CHLORIDE: 104 mmol/L (ref 101–111)
CO2: 22 mmol/L (ref 22–32)
Calcium: 7.9 mg/dL — ABNORMAL LOW (ref 8.9–10.3)
Creatinine, Ser: 1.07 mg/dL — ABNORMAL HIGH (ref 0.44–1.00)
GFR calc Af Amer: >60 mL/min (ref >60)
GFR calc non Af Amer: 58 mL/min — ABNORMAL LOW (ref >60)
GLUCOSE: 106 mg/dL — AB (ref 65–99)
POTASSIUM: 3.4 mmol/L — AB (ref 3.5–5.1)
SODIUM: 138 mmol/L (ref 135–145)
Total Bilirubin: 1 mg/dL (ref 0.3–1.2)
Total Protein: 7.8 g/dL (ref 6.5–8.1)

## 2016-07-28 LAB — PROTIME-INR
INR: 4.4 — AB
Prothrombin Time: 43.2 seconds — ABNORMAL HIGH (ref 11.4–15.2)

## 2016-07-28 LAB — MAGNESIUM: Magnesium: 2 mg/dL (ref 1.7–2.4)

## 2016-07-28 LAB — HCG, QUANTITATIVE, PREGNANCY: HCG, BETA CHAIN, QUANT, S: 1157 m[IU]/mL — AB (ref <5)

## 2016-07-28 MED ORDER — BISACODYL 10 MG RE SUPP: 10.0000 mg | Freq: Every day | RECTAL | Status: DC | PRN

## 2016-07-28 NOTE — Progress Notes (Signed)
ANTICOAGULATION CONSULT NOTE - Follow Up Consult  Pharmacy Consult for warfarin Indication: hx DVT  No Known Allergies  Patient Measurements: Height: 5\' 7"  (170.2 cm) Weight: 150 lb (68 kg) IBW/kg (Calculated) : 61.6  Vital Signs: Temp: 98.5 F (36.9 C) (07/11 0602) BP: 106/61 (07/11 0602) Pulse Rate: 102 (07/11 0602)  Labs:  Recent Labs  07/26/16 1950 08/12/2016 0408 07/28/16 0754  HGB 7.8*  --  7.0*  HCT 24.1*  --  22.3*  PLT 454*  --  483*  LABPROT  --  39.2* 43.2*  INR  --  3.90 4.40*  CREATININE 2.19*  --  1.07*    Estimated Creatinine Clearance: 59.1 mL/min (A) (by C-G formula based on SCr of 1.07 mg/dL (H)).   Assessment: 12 yof with hx of DVT on warfarin PTA. Last outpatient INR>6 and pt held warfarin x 4 days PTA. INR up to 4.4 today despite holding warfarin. No bleeding noted although chronic anemia noted, Hgb low at 7, platelets are normal.  PTA warfarin dose: 0.5mg  daily (last dose 07/22/16 PTA) per med rec  Goal of Therapy:  INR 2-3 Monitor platelets by anticoagulation protocol: Yes   Plan:  - No warfarin tonight - Daily INR - Monitor for s/sx of bleeding   Renold Genta, PharmD, BCPS Clinical Pharmacist Phone for today - [place] - [phone] 07/28/2016 11:35 AM

## 2016-07-28 NOTE — Progress Notes (Signed)
PT Evaluation  Pt admitted with/for N/V and UTI.  Pt currently needing min to mod assist for ADL 's and transfers for which she could do by herself or with very little assist.  Pt currently limited functionally due to the problems listed below.  (see problems list.)  Pt will benefit from PT to maximize function and safety to be able to get home safely with available assist of family   07/28/16 1300  PT Visit Information  Last PT Received On 07/28/16  Assistance Needed +1 (transfers only at baseline)  PT/OT/SLP Co-Evaluation/Treatment Yes  Reason for Co-Treatment Complexity of the patient's impairments (multi-system involvement)  PT goals addressed during session Mobility/safety with mobility  History of Present Illness Ann Lynch is a 56 y.o. 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 She presented with nausea and vomiting and found to have a urinary tract infection.  Precautions  Precautions Fall  Home Living  Family/patient expects to be discharged to: Private residence  Living Arrangements Other relatives;Parent  Available Help at Discharge Available 24 hours/day  Type of [place] One level  Bathroom Shower/Tub Tub/shower unit;Curtain  [place] - 2 wheels;BSC;Wheelchair - manual;Wheelchair - power;Hospital bed;Hand held shower head  Additional Comments per mom the [facility] and hospital bed need to be replaced due to poor condition from wear  Prior Function  Level of [place] Needs assistance  Gait / Transfers Assistance Needed non ambulatory only performs stand pivots to the left other than from toilet where she transfers to the right as well  ADL's / [place] self, does dressing except cannot stand and pull up clothing at same time, can bath self once A'd into tub; can toilet  with assist for pants  Communication  Communication No difficulties  Pain Assessment  Pain Assessment Faces  Faces Pain Scale 0  Cognition  Arousal/Alertness Awake/alert  Behavior During Therapy WFL for tasks assessed/performed  Overall Cognitive Status Within Functional Limits for tasks assessed  Upper Extremity Assessment  Upper Extremity Assessment Defer to OT evaluation  RUE Deficits / Details No functional use at baseline from previous CVA  RUE Sensation (sensation in tact)  Lower Extremity Assessment  Lower Extremity Assessment Defer to PT evaluation;RLE deficits/detail;LLE deficits/detail  RLE Deficits / Details pt with minimal movement in synergy.  Not functional use .  LLE Deficits / Details pt used the leg to bridge and transfer/stand.  Grossly 4/5  Cervical / Trunk Assessment  Cervical / Trunk Assessment Normal  Bed Mobility  Overal bed mobility Needs Assistance  Bed Mobility Supine to Sit;Sit to Supine  Supine to sit Mod assist  Sit to supine Min assist  General bed mobility comments pt needing cues for sequencing and direction plus stability and scoot assist.  Transfers  Overall transfer level Needs assistance  Transfers Sit to/from Stand;Squat Pivot Transfers  Sit to Stand Mod assist;Min assist  Squat pivot transfers Min assist;Mod assist  General transfer comment Pt performed BSC transfer x2, assist provied is reflected sequentially  Balance  Overall balance assessment Needs assistance  Sitting-balance support Single extremity supported;Bilateral upper extremity supported;Feet supported  Sitting balance-Leahy Scale Fair  Sitting balance - Comments abe to sit EOB without assist from therapist  PT - End of Session  Activity Tolerance Patient tolerated treatment well  Patient left in chair;with call bell/phone within reach;with chair alarm  set  Nurse Communication Mobility status  PT Assessment  PT Recommendation/Assessment Patient needs continued PT services   PT Visit Diagnosis Other abnormalities of gait and mobility (R26.89)  PT Problem List Decreased strength;Decreased activity tolerance;Decreased balance;Decreased mobility;Decreased coordination  PT Plan  PT Frequency (ACUTE ONLY) Min 2X/week  PT Treatment/Interventions (ACUTE ONLY) Functional mobility training;Therapeutic activities;Balance training;Patient/family education  AM-PAC PT "6 Clicks" Daily Activity Outcome Measure  Difficulty turning over in bed (including adjusting bedclothes, sheets and blankets)? 1  Difficulty moving from lying on back to sitting on the side of the bed?  1  Difficulty sitting down on and standing up from a chair with arms (e.g., wheelchair, bedside commode, etc,.)? 1  Help needed moving to and from a bed to chair (including a wheelchair)? 2  Help needed walking in hospital room? 1  Help needed climbing 3-5 steps with a railing?  1  6 Click Score 7  Mobility G Code  CM  PT Recommendation  Follow Up Recommendations No PT follow up  PT equipment None recommended by PT  Individuals Consulted  Consulted and Agree with Results and Recommendations Patient  Acute Rehab PT Goals  Patient Stated Goal to have a bowel movement  PT Goal Formulation With patient  Time For Goal Achievement 08/04/16  Potential to Achieve Goals Good  PT Time Calculation  PT Start Time (ACUTE ONLY) 1135  PT Stop Time (ACUTE ONLY) 1208  PT Time Calculation (min) (ACUTE ONLY) 33 min  PT General Charges  $$ ACUTE PT VISIT 1 Procedure  PT Evaluation  $PT Eval Moderate Complexity 1 Procedure  Written Expression  Dominant Hand Right   07/28/2016  Donnella Sham, PT [phone]  (pager).

## 2016-07-28 NOTE — Progress Notes (Addendum)
PROGRESS NOTE    [name]  PYK:[id number] DOB: 12-24-1960 DOA: 08/04/2016 PCP: Charolette Forward, MD   Brief Narrative: Ann Lynch is a 56 y.o. 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 She presented with nausea and vomiting and found to have a urinary tract infection.   Assessment & Plan:  Active Problems:   AKI (acute kidney injury) ([place])   Seizures (HCC)   Supratherapeutic INR   Pyelonephritis   Intractable nausea and vomiting   Chronic deep vein thrombosis (DVT) (HCC)   Positive pregnancy test   Hypokalemia   Severe protein-calorie malnutrition [name]: less than 60% of standard weight) (HCC)   History of CVA (cerebrovascular accident)   UTI Intractable nausea/vomiting Vomiting improved. Urinalysis significant for possible infection. Patient started on ceftriaxone. She has a recent history of Escherichia coli UTI resistant to fluoroquinolones. This is possibly an untreated infection from one month ago. -Continue ceftriaxone -Add urine micro -trial oral diet  Pelvic fluid Elevated B-hcg Discussed with Dr. Arlina Robes of OB/Gyn faculty practice who recommends repeat b-hcg in 48 hours. If trending down, or plateau, can follow-up in outpatient clinic on discharge. No other workup recommended for now.  Anemia Chronic. Patient is perimenopausal. Hemoglobin dropped down to 7. Patient is asymptomatic. INR is supratherapeutic with no evidence of bleeding on exam or history. -Repeat CBC in the morning -FOBT  Chronic right leg DVT Supratherapeutic INR Patient on coumadin. INR supratherapeutic -warfarin per pharmacy  History of CVA Patient with residual right-sided deficits -continue aspirin  Depression Anxiety -continue Cymbalta -continue amitriptyline  Seizure disorder -continue dilantin ER  Severe protein calorie malnutrition -Dietitian consult  Essential  hypertension -metoprolol  Acute kidney injury Improved with IV fluids  Decreased bowel sounds Abdominal distension Distension probably contributed to by pelvic fluid. Patient without a bowel movement recently -KUB, if no obstruction, stool softener/suppository/enema.   DVT prophylaxis: SCDs Code Status: Full code Family Communication: Attempted to call mother Disposition Plan: Discharge in 48-72 hours   Consultants:   Gynecology  Procedures:   None  Antimicrobials:  Ceftriaxone    Subjective: Patient states that she has improvement in her abdominal pain. Afebrile overnight.  Objective: Vitals:   08/07/2016 2119 07/19/2016 2120 07/26/2016 2339 07/28/16 0602  BP: 119/88  110/82 106/61  Pulse: (!) 124 (!) 121 87 (!) 102  Resp: 18   18  Temp: 98.3 F (36.8 C)   98.5 F (36.9 C)  TempSrc:      SpO2: 100% 100%  100%  Weight:      Height:        Intake/Output Summary (Last 24 hours) at 07/28/16 1029 Last data filed at 07/28/16 5397  Gross per 24 hour  Intake             2150 ml  Output             1100 ml  Net             1050 ml   Filed Weights   07/26/16 1941  Weight: 68 kg (150 lb)    Examination:  General exam: Appears calm and comfortable Respiratory system: Clear to auscultation. Respiratory effort normal. Cardiovascular system: S1 & S2 heard, RRR. No murmurs, rubs, gallops or clicks. Gastrointestinal system: Abdomen is slightly distended, soft and nontender. Decreased bowel sounds heard. Central nervous system: Alert and oriented. Extremities: No edema. No calf tenderness Skin: No cyanosis. No rashes Psychiatry: Judgement and insight appear  normal. Mood & affect appropriate.     Data Reviewed: I have personally reviewed following labs and imaging studies  CBC:  Recent Labs Lab 07/26/16 1950 07/28/16 0754  WBC 16.3* 12.2*  NEUTROABS 13.3*  --   HGB 7.8* 7.0*  HCT 24.1* 22.3*  MCV 92.0 95.3  PLT 454* 637*   Basic Metabolic  Panel:  Recent Labs Lab 07/26/16 1950 08/12/2016 1829 07/28/16 0754  NA 132*  --  138  K 3.1*  --  3.4*  CL 91*  --  104  CO2 26  --  22  GLUCOSE 129*  --  106*  BUN 63*  --  42*  CREATININE 2.19*  --  1.07*  CALCIUM 8.5*  --  7.9*  MG  --  1.4* 2.0  PHOS  --  2.4*  --    GFR: Estimated Creatinine Clearance: 59.1 mL/min (A) (by C-G formula based on SCr of 1.07 mg/dL (H)). Liver Function Tests:  Recent Labs Lab 07/26/16 1950 07/28/16 0754  AST 89* 115*  ALT 27 32  ALKPHOS 140* 112  BILITOT 1.3* 1.0  PROT 8.6* 7.8  ALBUMIN 2.2* 1.8*   No results for input(s): LIPASE, AMYLASE in the last 168 hours. No results for input(s): AMMONIA in the last 168 hours. Coagulation Profile:  Recent Labs Lab 08/04/2016 0408 07/28/16 0754  INR 3.90 4.40*   Cardiac Enzymes: No results for input(s): CKTOTAL, CKMB, CKMBINDEX, TROPONINI in the last 168 hours. BNP (last 3 results) No results for input(s): PROBNP in the last 8760 hours. HbA1C: No results for input(s): HGBA1C in the last 72 hours. CBG: No results for input(s): GLUCAP in the last 168 hours. Lipid Profile: No results for input(s): CHOL, HDL, LDLCALC, TRIG, CHOLHDL, LDLDIRECT in the last 72 hours. Thyroid Function Tests: No results for input(s): TSH, T4TOTAL, FREET4, T3FREE, THYROIDAB in the last 72 hours. Anemia Panel: No results for input(s): VITAMINB12, FOLATE, FERRITIN, TIBC, IRON, RETICCTPCT in the last 72 hours. Sepsis Labs:  Recent Labs Lab 07/26/16 2019 08/04/2016 0417  LATICACIDVEN 1.84 1.40    No results found for this or any previous visit (from the past 240 hour(s)).       Radiology Studies: US Transvaginal Non-ob  Result Date: 08/02/2016 CLINICAL DATA:  Initial evaluation for acute lower pelvic pain, elevated beta HCG, question ovarian mass. EXAM: TRANSABDOMINAL AND TRANSVAGINAL ULTRASOUND OF PELVIS TECHNIQUE: Both transabdominal and transvaginal ultrasound examinations of the pelvis were performed.  Transabdominal technique was performed for global imaging of the pelvis including uterus, ovaries, adnexal regions, and pelvic cul-de-sac. It was necessary to proceed with endovaginal exam following the transabdominal exam to visualize the uterus and ovaries. COMPARISON:  Prior CT from 06/12/2016. FINDINGS: Uterus Measurements: 9.2 x 3.9 x 4.3 cm. Uterus heterogeneous without focal mass. Uterus is anteverted. Endometrium Thickness: 5 mm.  No focal abnormality visualized. Right ovary Measurements: 2.6 x 2.0 x 2.2 cm. Normal appearance/no adnexal mass. Left ovary Not visualized.  No adnexal mass. Other findings Large volume free fluid within the pelvis. IMPRESSION: 1. Normal sonographic appearance of the right ovary, with nonvisualization of the left ovary. No adnexal mass evident by sonography. 2. Large volume free fluid within the pelvis. 3. Normal sonographic appearance of the uterus. Electronically Signed   By: Jeannine Boga M.D.   On: 08/13/2016 06:20   US Pelvis Complete  Result Date: 07/30/2016 CLINICAL DATA:  Initial evaluation for acute lower pelvic pain, elevated beta HCG, question ovarian mass. EXAM: TRANSABDOMINAL AND TRANSVAGINAL ULTRASOUND OF  PELVIS TECHNIQUE: Both transabdominal and transvaginal ultrasound examinations of the pelvis were performed. Transabdominal technique was performed for global imaging of the pelvis including uterus, ovaries, adnexal regions, and pelvic cul-de-sac. It was necessary to proceed with endovaginal exam following the transabdominal exam to visualize the uterus and ovaries. COMPARISON:  Prior CT from 06/12/2016. FINDINGS: Uterus Measurements: 9.2 x 3.9 x 4.3 cm. Uterus heterogeneous without focal mass. Uterus is anteverted. Endometrium Thickness: 5 mm.  No focal abnormality visualized. Right ovary Measurements: 2.6 x 2.0 x 2.2 cm. Normal appearance/no adnexal mass. Left ovary Not visualized.  No adnexal mass. Other findings Large volume free fluid within the  pelvis. IMPRESSION: 1. Normal sonographic appearance of the right ovary, with nonvisualization of the left ovary. No adnexal mass evident by sonography. 2. Large volume free fluid within the pelvis. 3. Normal sonographic appearance of the uterus. Electronically Signed   By: Jeannine Boga M.D.   On: 07/26/2016 06:20   US Renal  Result Date: 08/16/2016 CLINICAL DATA:  Acute kidney injury.  Pyelonephritis EXAM: RENAL / URINARY TRACT ULTRASOUND COMPLETE COMPARISON:  06/12/2016 renal ultrasound FINDINGS: Right Kidney: Length: 9 cm (likely underestimated due to partial visualization of the lower pole). Echogenic, consistent with medical renal disease. No hydronephrosis. Left Kidney: Length: 11 cm. Echogenic, consistent with medical renal disease. No hydronephrosis. Bladder: Appears normal for degree of bladder distention. Small ascites.  Pleural effusion on the right at least. IMPRESSION: 1. No hydronephrosis. 2. Medical renal disease. 3. Small ascites that is new from 06/12/2016 sonogram. Right pleural effusion. Electronically Signed   By: Monte Fantasia M.D.   On: 07/26/2016 10:07        Scheduled Meds: . amitriptyline  25 mg Oral QHS  . aspirin EC  81 mg Oral Daily  . atorvastatin  40 mg Oral QHS  . cloNIDine  0.2 mg Oral TID  . DULoxetine  20 mg Oral QHS  . feeding supplement  1 Container Oral TID BM  . feeding supplement (ENSURE ENLIVE)  237 mL Oral BID BM  . gabapentin  400 mg Oral TID  . metoprolol tartrate  100 mg Oral BID  . pantoprazole  40 mg Oral BID  . phenytoin  100 mg Oral TID   Continuous Infusions: . sodium chloride 125 mL/hr at 07/28/16 0243  . cefTRIAXone (ROCEPHIN)  IV Stopped (07/28/16 0437)     LOS: 1 day     Cordelia Poche, MD [facility] 07/28/2016, 10:29 AM Pager: [phone]  If 7PM-7AM, please contact night-coverage [email or web address] Password [facility] 07/28/2016, 10:29 AM

## 2016-07-28 NOTE — Progress Notes (Signed)
Initial Nutrition Assessment  DOCUMENTATION CODES:   Not applicable  INTERVENTION:   Boost Breeze po TID, each supplement provides 250 kcal and 9 grams of protein  RD will order vanilla and strawberry Ensure when diet advanced.   NUTRITION DIAGNOSIS:   Inadequate oral intake related to acute illness as evidenced by meal completion < 25%.  GOAL:   Patient will meet greater than or equal to 90% of their needs  MONITOR:   PO intake, Supplement acceptance, Labs, Weight trends  REASON FOR ASSESSMENT:   Malnutrition Screening Tool    ASSESSMENT:    56 y.o. female with medical history significant of seizures, DVT on Coumadin, CVA with residual right-sided weakness, hypertension. Patient presenting with several day history of nausea and vomiting.   Met with pt in room today. Pt reports poor appetite and oral intake for 1 week pta. Pt reports that she continues to have poor appetite and oral intake. Pt had a clear liquid diet tray on her side table that had not been touched at time of RD visit. Pt with history of CVA. Per MD note, there is concern that pt may be having trouble swallowing; pt denies any dysphagia. Per chart, pt has lost 27lbs(15%) in 6 weeks; this is severe. RD encouraged intake of meals and supplements. Pt would like to have Ensure when diet advanced. Pt with hypokalemia and hypophosphatemia  today; monitor and supplement as needed per MD discretion.   Medications reviewed and include: aspirin, protonix, ceftriaxone    Labs reviewed: K 3.4(L), Ca 7.9(L) adj. 9.66 wnl, P 2.4(L), Mg 2.0 wnl, alb 1.8(L) Wbc- 12.2(H), Hgb 7.0(L), Hct 22.3(L)  Nutrition-Focused physical exam completed. Findings are no fat depletion, no muscle depletion, and mild edema in lower extremity.   Diet Order:  Diet clear liquid Room service appropriate? Yes; Fluid consistency: Thin  Skin:  Reviewed, no issues  Last BM:  7/9  Height:   Ht Readings from Last 1 Encounters:  07/26/16 5' 7"   (1.702 m)    Weight:   Wt Readings from Last 1 Encounters:  07/26/16 150 lb (68 kg)    Ideal Body Weight:  61.3 kg  BMI:  Body mass index is 23.49 kg/m.  Estimated Nutritional Needs:   Kcal:  1700-2000kcal/day   Protein:  68-82g/day   Fluid:  >1.7L/day   EDUCATION NEEDS:   Education needs no appropriate at this time  Koleen Distance MS, RD, [place] Pager #[phone] After Hours Pager: [phone]

## 2016-07-28 NOTE — Evaluation (Signed)
Occupational Therapy Evaluation Patient Details Name: Ann Lynch MRN: [id number] DOB: [birth year] Today's Date: 07/28/2016    History of Present Illness Ann Lynch is a 56 y.o. 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 She presented with nausea and vomiting and found to have a urinary tract infection.   Clinical Impression   PTA Pt required min A for LB ADL and was performing transfers with modified independence. Pt currently requires mod A for transfers (stand pivot). Pt will require skilled OT in the acute setting to maximize safety and independence in ADL and functional transfers to return to PLOF. Next session to focus on facilitating transfers to [facility]     Follow Up Recommendations  Supervision/Assistance - 24 hour;No OT follow up    Equipment Recommendations  3 in 1 bedside commode    Recommendations for Other Services       Precautions / Restrictions Precautions Precautions: Fall      Mobility Bed Mobility Overal bed mobility: Needs Assistance Bed Mobility: Supine to Sit;Sit to Supine     Supine to sit: Mod assist Sit to supine: Min assist   General bed mobility comments: pt needing cues for sequencing and direction plus stability and scoot assist.  Transfers Overall transfer level: Needs assistance   Transfers: Sit to/from [employer] Transfers Sit to Stand: Mod assist;Min assist   Squat pivot transfers: Min assist;Mod assist     General transfer comment: Pt performed BSC transfer x2, assist provied is reflected sequentially    Balance Overall balance assessment: Needs assistance Sitting-balance support: Single extremity supported;Bilateral upper extremity supported;Feet supported Sitting balance-Leahy Scale: Fair Sitting balance - Comments: abe to sit EOB without assist from therapist                                   ADL either performed or assessed  with clinical judgement   ADL Overall ADL's : Needs assistance/impaired Eating/Feeding: Set up;Bed level;Sitting   Grooming: Set up;Sitting   Upper Body Bathing: Sitting;Minimal assistance;With caregiver independent assisting   Lower Body Bathing: Sitting/lateral leans;Minimal assistance;With caregiver independent assisting   Upper Body Dressing : Minimal assistance;With caregiver independent assisting;Sitting   Lower Body Dressing: Maximal assistance;+2 for safety/equipment;Sit to/from stand Lower Body Dressing Details (indicate cue type and reason): to don underwear Toilet Transfer: Moderate assistance;Stand-pivot;BSC   Toileting- Clothing Manipulation and Hygiene: Maximal assistance;Sit to/from stand Toileting - Clothing Manipulation Details (indicate cue type and reason): assist to don/manage underwear (mesh)     Functional mobility during ADLs: Wheelchair (wc at baseline)       Vision Patient Visual Report: No change from baseline       Perception     Praxis      Pertinent Vitals/Pain Pain Assessment: Faces Faces Pain Scale: No hurt     Hand Dominance Right   Extremity/Trunk Assessment Upper Extremity Assessment Upper Extremity Assessment: RUE deficits/detail RUE Deficits / Details: No functional use at baseline from previous CVA RUE Sensation:  (sensation in tact)   Lower Extremity Assessment Lower Extremity Assessment: Defer to PT evaluation;RLE deficits/detail;LLE deficits/detail RLE Deficits / Details: pt with minimal movement in synergy.  Not functional use . LLE Deficits / Details: pt used the leg to bridge and transfer/stand.  Grossly 4/5   Cervical / Trunk Assessment Cervical / Trunk Assessment: Normal   Communication Communication Communication: No difficulties   Cognition Arousal/Alertness: Awake/alert  Behavior During Therapy: WFL for tasks assessed/performed Overall Cognitive Status: Within Functional Limits for tasks assessed                                      General Comments       Exercises     Shoulder Instructions      Home Living Family/patient expects to be discharged to:: Private residence Living Arrangements: Other relatives;Parent Available Help at Discharge: Available 24 hours/day Type of Home: House Home Access: Ramped entrance     Home Layout: One level     Bathroom Shower/Tub: Tub/shower unit;Curtain   Bathroom Toilet: Standard Bathroom Accessibility: Yes   Home Equipment: Environmental consultant - 2 wheels;Bedside commode;Wheelchair - Education officer, community - power;Hospital bed;Hand held shower head   Additional Comments: per mom the [facility] and hospital bed need to be replaced due to poor condition from wear      Prior Functioning/Environment Level of Independence: Needs assistance  Gait / Transfers Assistance Needed: non ambulatory only performs stand pivots to the left other than from toilet where she transfers to the right as well ADL's / Homemaking Assistance Needed: Feeds self, does dressing except cannot stand and pull up clothing at same time, can bath self once A'd into tub; can toilet with assist for pants            OT Problem List: Decreased strength;Decreased activity tolerance;Impaired balance (sitting and/or standing);Decreased safety awareness      OT Treatment/Interventions: Self-care/ADL training;Therapeutic exercise;Energy conservation;DME and/or AE instruction;Therapeutic activities;Patient/family education;Balance training    OT Goals(Current goals can be found in the care plan section) Acute Rehab OT Goals Patient Stated Goal: to have a bowel movement OT Goal Formulation: With patient Time For Goal Achievement: 08/11/16 Potential to Achieve Goals: Good ADL Goals Pt Will Perform Grooming: with set-up;sitting Pt Will Transfer to Toilet: with min guard assist;stand pivot transfer;bedside commode (to and from [facility]; caregiver independent in assisting) Pt Will Perform Toileting -  Clothing Manipulation and hygiene: with min assist;with caregiver independent in assisting;sit to/from stand Pt Will Perform Tub/Shower Transfer: Tub transfer;with min guard assist;with caregiver independent in assisting;tub bench  OT Frequency: Min 2X/week   Barriers to D/C:            Co-evaluation PT/OT/SLP Co-Evaluation/Treatment: Yes Reason for Co-Treatment: Complexity of the patient's impairments (multi-system involvement);To address functional/ADL transfers   OT goals addressed during session: ADL's and self-care      AM-PAC PT "6 Clicks" Daily Activity     Outcome Measure Help from another person eating meals?: A Little Help from another person taking care of personal grooming?: A Little Help from another person toileting, which includes using toliet, bedpan, or urinal?: A Little Help from another person bathing (including washing, rinsing, drying)?: A Little Help from another person to put on and taking off regular upper body clothing?: A Little Help from another person to put on and taking off regular lower body clothing?: A Lot 6 Click Score: 17   End of Session Equipment Utilized During Treatment: Gait belt Nurse Communication: Mobility status;Other (comment) (how to transfer back to bed)  Activity Tolerance: Patient tolerated treatment well Patient left: in chair;with call bell/phone within reach;with chair alarm set  OT Visit Diagnosis: Unsteadiness on feet (R26.81);Muscle weakness (generalized) (M62.81)                Time: 6606-3016 OT Time Calculation (min): 40 min Charges:  OT General Charges $OT Visit: 1 Procedure OT Evaluation $OT Eval Moderate Complexity: 1 Procedure OT Treatments $Self Care/Home Management : 8-22 mins G-Codes:     Hulda Humphrey OTR/L [place] 07/28/2016, 3:02 PM

## 2016-07-28 NOTE — Progress Notes (Signed)
CRITICAL VALUE ALERT  Critical Value:  INR 4.40  Date & Time Notied:  07/28/16 @0935   Provider Notified: Dr. Lonny Prude informed.  Orders Received/Actions taken: None, pharmacy consulting and monitoring.

## 2016-07-29 ENCOUNTER — Inpatient Hospital Stay (HOSPITAL_COMMUNITY): Payer: Medicare HMO

## 2016-07-29 DIAGNOSIS — R791 Abnormal coagulation profile: Secondary | ICD-10-CM

## 2016-07-29 LAB — BASIC METABOLIC PANEL
Anion gap: 9 (ref 5–15)
BUN: 30 mg/dL — AB (ref 6–20)
CHLORIDE: 105 mmol/L (ref 101–111)
CO2: 25 mmol/L (ref 22–32)
CREATININE: 0.85 mg/dL (ref 0.44–1.00)
Calcium: 8.1 mg/dL — ABNORMAL LOW (ref 8.9–10.3)
GFR calc Af Amer: >60 mL/min (ref >60)
GFR calc non Af Amer: >60 mL/min (ref >60)
GLUCOSE: 114 mg/dL — AB (ref 65–99)
POTASSIUM: 3.2 mmol/L — AB (ref 3.5–5.1)
SODIUM: 139 mmol/L (ref 135–145)

## 2016-07-29 LAB — CBC
HCT: 22.8 % — ABNORMAL LOW (ref 36.0–46.0)
Hemoglobin: 7 g/dL — ABNORMAL LOW (ref 12.0–15.0)
MCH: 28.9 pg (ref 26.0–34.0)
MCHC: 30.7 g/dL (ref 30.0–36.0)
MCV: 94.2 fL (ref 78.0–100.0)
PLATELETS: 386 10*3/uL (ref 150–400)
RBC: 2.42 MIL/uL — AB (ref 3.87–5.11)
RDW: 18.7 % — ABNORMAL HIGH (ref 11.5–15.5)
WBC: 13 10*3/uL — ABNORMAL HIGH (ref 4.0–10.5)

## 2016-07-29 LAB — PROTIME-INR
INR: 3.93
PROTHROMBIN TIME: 39.4 s — AB (ref 11.4–15.2)

## 2016-07-29 MED ORDER — ALBUTEROL SULFATE (2.5 MG/3ML) 0.083% IN NEBU
5.0000 mg | INHALATION_SOLUTION | Freq: Once | RESPIRATORY_TRACT | Status: AC
  Administered 2016-07-29: 2.5 mg via RESPIRATORY_TRACT
  Filled 2016-07-29: qty 6

## 2016-07-29 MED ORDER — POTASSIUM CHLORIDE CRYS ER 20 MEQ PO TBCR
40.0000 meq | EXTENDED_RELEASE_TABLET | Freq: Once | ORAL | Status: AC
  Administered 2016-07-29: 40 meq via ORAL
  Filled 2016-07-29: qty 2

## 2016-07-29 MED ORDER — MORPHINE SULFATE (PF) 4 MG/ML IV SOLN
4.0000 mg | INTRAVENOUS | Status: DC | PRN
  Administered 2016-08-01 – 2016-08-03 (×2): 4 mg via INTRAVENOUS
  Filled 2016-07-29 (×2): qty 1

## 2016-07-29 MED ORDER — ALBUTEROL SULFATE (2.5 MG/3ML) 0.083% IN NEBU
5.0000 mg | INHALATION_SOLUTION | Freq: Four times a day (QID) | RESPIRATORY_TRACT | Status: DC | PRN
  Administered 2016-07-31 – 2016-08-01 (×2): 5 mg via RESPIRATORY_TRACT
  Filled 2016-07-29 (×3): qty 6

## 2016-07-29 MED ORDER — IPRATROPIUM BROMIDE 0.02 % IN SOLN
0.5000 mg | Freq: Once | RESPIRATORY_TRACT | Status: AC
Start: 2016-07-29 — End: 2016-07-29
  Administered 2016-07-29: 0.5 mg via RESPIRATORY_TRACT
  Filled 2016-07-29: qty 2.5

## 2016-07-29 MED ORDER — IOPAMIDOL (ISOVUE-300) INJECTION 61%
INTRAVENOUS | Status: AC
  Administered 2016-07-29: 75 mL
  Filled 2016-07-29: qty 75

## 2016-07-29 MED ORDER — DEXTROSE 5 % IV SOLN
2.0000 g | INTRAVENOUS | Status: DC
  Administered 2016-07-30 – 2016-08-03 (×5): 2 g via INTRAVENOUS
  Filled 2016-07-29 (×6): qty 2

## 2016-07-29 NOTE — Progress Notes (Signed)
   07/29/16 1641 07/29/16 1648  Vitals  BP 97/83 100/60  BP Location Left Arm Left Arm  BP Method Automatic Manual  Patient Position (if appropriate) Lying Sitting  MD aware of bp. Held 1700 clonidine dose per MD Netty, will cont. To monitor BP.

## 2016-07-29 NOTE — Progress Notes (Signed)
ANTICOAGULATION CONSULT NOTE - Follow Up Consult  Pharmacy Consult for warfarin Indication: hx DVT  No Known Allergies  Patient Measurements: Height: 5\' 7"  (170.2 cm) Weight: 150 lb (68 kg) IBW/kg (Calculated) : 61.6  Vital Signs: Temp: 98.3 F (36.8 C) (07/12 0452) Temp Source: Oral (07/12 0024) BP: 111/72 (07/12 0452) Pulse Rate: 97 (07/12 0452)  Labs:  Recent Labs  07/26/16 1950 08/02/2016 0408 07/28/16 0754 07/29/16 0518  HGB 7.8*  --  7.0* 7.0*  HCT 24.1*  --  22.3* 22.8*  PLT 454*  --  483* 386  LABPROT  --  39.2* 43.2* 39.4*  INR  --  3.90 4.40* 3.93  CREATININE 2.19*  --  1.07* 0.85    Estimated Creatinine Clearance: 74.4 mL/min (by C-G formula based on SCr of 0.85 mg/dL).   Assessment: 32 yof with hx of DVT on warfarin PTA. Last outpatient INR>6 and pt held warfarin x 4 days PTA. INR up at 3.93 today despite holding warfarin. No bleeding noted although chronic anemia noted, Hgb low at 7, platelets are normal.  PTA warfarin dose: 0.5mg  daily (last dose 07/22/16 PTA) per med rec  Goal of Therapy:  INR 2-3 Monitor platelets by anticoagulation protocol: Yes   Plan:  - No warfarin tonight - Daily INR - Monitor for s/sx of bleeding   Jalene Mullet, Pharm.D. PGY1 Pharmacy Resident 07/29/2016 11:28 AM Main Pharmacy: [phone] Pager: [phone]

## 2016-07-29 NOTE — Progress Notes (Addendum)
PROGRESS NOTE    Ann Lynch  GBT:[id number] DOB: Aug 15, 1960 DOA: 07/23/2016 PCP: Charolette Forward, MD   Brief Narrative: Ann Lynch is a 56 y.o. 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 She presented with nausea and vomiting and found to have a urinary tract infection.   Assessment & Plan:  Active Problems:   AKI (acute kidney injury) ([place])   Seizures (HCC)   Supratherapeutic INR   Pyelonephritis   Intractable nausea and vomiting   Chronic deep vein thrombosis (DVT) (HCC)   Positive pregnancy test   Hypokalemia   Severe protein-calorie malnutrition [name]: less than 60% of standard weight) (HCC)   History of CVA (cerebrovascular accident)   UTI Intractable nausea/vomiting Vomiting improved. Urinalysis significant for possible infection. Patient started on ceftriaxone. She has a recent history of Escherichia coli UTI resistant to fluoroquinolones. This is possibly an untreated infection from one month ago. -Continue ceftriaxone -Add urine micro -trial oral diet  Pelvic fluid Elevated B-hcg Discussed with Dr. Arlina Robes of OB/Gyn faculty practice who recommends repeat b-hcg in 48 hours. If trending down, or plateau, can follow-up in outpatient clinic on discharge. No other workup recommended for now. No intrauterine or ectopic pregnancy seen on transvaginal ultrasound. Patient is abstinent.  Anemia Chronic. Patient is perimenopausal. Hemoglobin dropped down to 7 and has remained stable. Patient is asymptomatic. INR is supratherapeutic and trending down with no evidence of bleeding on exam or history. History of positive FOBT. -Repeat CBC in the morning -FOBT  Chronic right leg DVT Supratherapeutic INR Patient on coumadin. INR supratherapeutic -warfarin per pharmacy  History of CVA Patient with residual right-sided deficits -continue aspirin  Depression Anxiety -continue  Cymbalta -continue amitriptyline  Seizure disorder -continue dilantin ER  Severe protein calorie malnutrition -Dietitian consult  Essential hypertension -metoprolol  Acute kidney injury Improved with IV fluids  Decreased bowel sounds Abdominal distension No obstruction seen on KUB.  Pleural effusion Right sided. History of atelectasis of that lung. -CT chest   DVT prophylaxis: SCDs Code Status: Full code Family Communication: None at bedside Disposition Plan: Discharge in 48-72 hours   Consultants:   Gynecology  Procedures:   None  Antimicrobials:  Ceftriaxone    Subjective: Abdominal pain resolved. Afebrile.  Objective: Vitals:   07/28/16 2110 07/29/16 0024 07/29/16 0452 07/29/16 0543  BP: 123/89 106/71 111/72   Pulse: 100 88 97   Resp: 18 19 18    Temp: 97.7 F (36.5 C) 98.4 F (36.9 C) 98.3 F (36.8 C)   TempSrc:  Oral    SpO2: 91% 97% 100% 98%  Weight:      Height:       No intake or output data in the 24 hours ending 07/29/16 1104 Filed Weights   07/26/16 1941  Weight: 68 kg (150 lb)    Examination:  General exam: Appears calm and comfortable Respiratory system: audible wheezing without stethoscope, Clear to auscultation bilaterally. Unlabored work of breathing. No wheezing or rales on auscultation of lungs. Cardiovascular system: S1 & S2 heard, RRR. No murmurs, rubs, gallops or clicks. Gastrointestinal system: Soft, non-tender, non-distended, no guarding, no rebound, no masses felt Central nervous system: Alert and oriented. Extremities: No edema. No calf tenderness Skin: No cyanosis. No rashes Psychiatry: Judgement and insight appear normal. Mood & affect appropriate.     Data Reviewed: I have personally reviewed following labs and imaging studies  CBC:  Recent Labs Lab 07/26/16 1950 07/28/16 0754 07/29/16 0518  WBC 16.3* 12.2* 13.0*  NEUTROABS 13.3*  --   --   HGB 7.8* 7.0* 7.0*  HCT 24.1* 22.3* 22.8*  MCV 92.0 95.3  94.2  PLT 454* 483* 629   Basic Metabolic Panel:  Recent Labs Lab 07/26/16 1950 08/14/2016 1829 07/28/16 0754 07/29/16 0518  NA 132*  --  138 139  K 3.1*  --  3.4* 3.2*  CL 91*  --  104 105  CO2 26  --  22 25  GLUCOSE 129*  --  106* 114*  BUN 63*  --  42* 30*  CREATININE 2.19*  --  1.07* 0.85  CALCIUM 8.5*  --  7.9* 8.1*  MG  --  1.4* 2.0  --   PHOS  --  2.4*  --   --    GFR: Estimated Creatinine Clearance: 74.4 mL/min (by C-G formula based on SCr of 0.85 mg/dL). Liver Function Tests:  Recent Labs Lab 07/26/16 1950 07/28/16 0754  AST 89* 115*  ALT 27 32  ALKPHOS 140* 112  BILITOT 1.3* 1.0  PROT 8.6* 7.8  ALBUMIN 2.2* 1.8*   No results for input(s): LIPASE, AMYLASE in the last 168 hours. No results for input(s): AMMONIA in the last 168 hours. Coagulation Profile:  Recent Labs Lab 08/11/2016 0408 07/28/16 0754 07/29/16 0518  INR 3.90 4.40* 3.93   Cardiac Enzymes: No results for input(s): CKTOTAL, CKMB, CKMBINDEX, TROPONINI in the last 168 hours. BNP (last 3 results) No results for input(s): PROBNP in the last 8760 hours. HbA1C: No results for input(s): HGBA1C in the last 72 hours. CBG: No results for input(s): GLUCAP in the last 168 hours. Lipid Profile: No results for input(s): CHOL, HDL, LDLCALC, TRIG, CHOLHDL, LDLDIRECT in the last 72 hours. Thyroid Function Tests: No results for input(s): TSH, T4TOTAL, FREET4, T3FREE, THYROIDAB in the last 72 hours. Anemia Panel: No results for input(s): VITAMINB12, FOLATE, FERRITIN, TIBC, IRON, RETICCTPCT in the last 72 hours. Sepsis Labs:  Recent Labs Lab 07/26/16 2019 07/25/2016 0417  LATICACIDVEN 1.84 1.40    No results found for this or any previous visit (from the past 240 hour(s)).       Radiology Studies: Dg Chest 2 View  Result Date: 07/28/2016 CLINICAL DATA:  Pleural effusion EXAM: CHEST  2 VIEW COMPARISON:  CT chest dated 06/17/2016 FINDINGS: Complete opacification of the left hemithorax. Right  lung is clear. No pneumothorax pre The heart is normal in size. IMPRESSION: Complete opacification of the left hemithorax. This appearance is nonspecific but could reflect a large pleural effusion, as per the provided clinical history. Electronically Signed   By: Julian Hy M.D.   On: 07/28/2016 18:06   Dg Abd Portable 1v  Result Date: 07/28/2016 CLINICAL DATA:  Abdominal pain with decreased bowel sounds and nausea. Abdominal distention. EXAM: PORTABLE ABDOMEN - 1 VIEW COMPARISON:  CT scans of the chest dated 06/17/2016 and of the pelvis dated 06/12/2016 and chest x-ray dated 06/15/2016 FINDINGS: There is minimal air in nondistended bowel. There is overall increased density in the abdomen consistent with ascites or fluid filled bowel loops. There is a large left pleural effusion with complete opacification of the visualized portion of the left hemithorax. IVC filter in place. No acute bone abnormality. IMPRESSION: 1. Increased density in the abdomen suggestive of ascites and/or fluid filled bowel loops. No dilated bowel. 2. Large left pleural effusion. Electronically Signed   By: Lorriane Shire M.D.   On: 07/28/2016 11:57        Scheduled Meds: .  amitriptyline  25 mg Oral QHS  . aspirin EC  81 mg Oral Daily  . atorvastatin  40 mg Oral QHS  . cloNIDine  0.2 mg Oral TID  . DULoxetine  20 mg Oral QHS  . feeding supplement  1 Container Oral TID BM  . feeding supplement (ENSURE ENLIVE)  237 mL Oral BID BM  . gabapentin  400 mg Oral TID  . metoprolol tartrate  100 mg Oral BID  . pantoprazole  40 mg Oral BID  . phenytoin  100 mg Oral TID   Continuous Infusions: . sodium chloride 125 mL/hr at 07/29/16 0830  . [START ON 07/30/2016] cefTRIAXone (ROCEPHIN)  IV       LOS: 2 days     Cordelia Poche, MD [facility] 07/29/2016, 11:04 AM Pager: [phone]  If 7PM-7AM, please contact night-coverage [email or web address] Password [facility] 07/29/2016, 11:04 AM

## 2016-07-30 ENCOUNTER — Inpatient Hospital Stay (HOSPITAL_COMMUNITY): Payer: Medicare HMO

## 2016-07-30 DIAGNOSIS — I82501 Chronic embolism and thrombosis of unspecified deep veins of right lower extremity: Secondary | ICD-10-CM

## 2016-07-30 DIAGNOSIS — K7460 Unspecified cirrhosis of liver: Secondary | ICD-10-CM

## 2016-07-30 DIAGNOSIS — R1011 Right upper quadrant pain: Secondary | ICD-10-CM

## 2016-07-30 LAB — HCG, QUANTITATIVE, PREGNANCY: hCG, Beta Chain, Quant, S: 1038 m[IU]/mL — ABNORMAL HIGH (ref <5)

## 2016-07-30 LAB — URINE CULTURE: Culture: >=100000 — AB

## 2016-07-30 LAB — PROTIME-INR
INR: 4.4
PROTHROMBIN TIME: 43.2 s — AB (ref 11.4–15.2)

## 2016-07-30 MED ORDER — METOPROLOL TARTRATE 50 MG PO TABS
100.0000 mg | ORAL_TABLET | Freq: Two times a day (BID) | ORAL | Status: DC
  Administered 2016-07-30 – 2016-08-01 (×4): 100 mg via ORAL
  Filled 2016-07-30 (×3): qty 1

## 2016-07-30 MED ORDER — BOOST / RESOURCE BREEZE PO LIQD
1.0000 | Freq: Three times a day (TID) | ORAL | Status: DC
  Administered 2016-07-31 – 2016-08-03 (×4): 1 via ORAL

## 2016-07-30 MED ORDER — LIDOCAINE HCL (PF) 1 % IJ SOLN
INTRAMUSCULAR | Status: AC
  Filled 2016-07-30: qty 30

## 2016-07-30 NOTE — Progress Notes (Signed)
PROGRESS NOTE    Ann Lynch  LEX:[id number] DOB: [birth year] DOA: 08/08/2016 PCP: Charolette Forward, MD   Brief Narrative: Ann Lynch is a 56 y.o. 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 She presented with nausea and vomiting and found to have a urinary tract infection.   Assessment & Plan:  Active Problems:   AKI (acute kidney injury) ([place])   Seizures (HCC)   Supratherapeutic INR   Pyelonephritis   Intractable nausea and vomiting   Chronic deep vein thrombosis (DVT) (HCC)   Positive pregnancy test   Hypokalemia   Severe protein-calorie malnutrition [name]: less than 60% of standard weight) (HCC)   History of CVA (cerebrovascular accident)   UTI Intractable nausea/vomiting Vomiting improved. Urine culture significant for E. Coli sensitive to ceftriaxone -Continue ceftriaxone  Pelvic fluid Elevated B-hcg Discussed with Dr. Arlina Robes of OB/Gyn faculty practice who recommends repeat b-hcg in 48 hours. If trending down, or plateau, can follow-up in outpatient clinic on discharge. No other workup recommended for now.  Pleural effusion Ascites Possibly secondary to liver disease. Evidence of possible cirrhosis on CT chest. -discontinue IV fluids -thoracentesis with labs -MRI abdomen -hepatitis panel  Anemia Chronic. Patient is perimenopausal. Hemoglobin dropped down to 7 and is stable. Patient is asymptomatic. INR is supratherapeutic with no evidence of bleeding on exam or history. -Repeat CBC in the morning -FOBT  Chronic right leg DVT Supratherapeutic INR Patient on coumadin. INR supratherapeutic. Possibly secondary to liver disease. -warfarin per pharmacy  History of CVA Patient with residual right-sided deficits -continue aspirin  Depression Anxiety -continue Cymbalta -continue amitriptyline  Seizure disorder -continue dilantin ER  Severe protein calorie  malnutrition -Dietitian consult  Essential hypertension -metoprolol  Acute kidney injury Improved with IV fluids  Decreased bowel sounds Abdominal distension Distension probably contributed to by pelvic fluid. Patient without a bowel movement recently -stool softener/suppository.   DVT prophylaxis: SCDs Code Status: Full code Family Communication: Attempted to call mother Disposition Plan: Discharge in 48-72 hours   Consultants:   Gynecology  Procedures:   None  Antimicrobials:  Ceftriaxone    Subjective: No concerns from patient.  Objective: Vitals:   07/29/16 1648 07/29/16 2215 07/30/16 0607 07/30/16 0911  BP: 100/60 124/87 (!) 136/96 108/88  Pulse:  (!) 117 (!) 104 (!) 104  Resp:  19 20   Temp:  98.2 F (36.8 C) 98.3 F (36.8 C)   TempSrc:      SpO2:  94% 93%   Weight:      Height:        Intake/Output Summary (Last 24 hours) at 07/30/16 1110 Last data filed at 07/30/16 0606  Gross per 24 hour  Intake           1862.5 ml  Output              150 ml  Net           1712.5 ml   Filed Weights   07/26/16 1941  Weight: 68 kg (150 lb)    Examination:  General exam: Appears calm and comfortable Respiratory system: Decreased breath sounds bilaterally, normal effort, no wheezing Cardiovascular system: Regular rate and rhythm. Normal S1 and S2. No heart murmurs present. No extra heart sounds Gastrointestinal system: Soft, right upper quadrant tenderness, slightly distended, no guarding, no rebound, no masses felt Central nervous system: alert and oriented Extremities: No edema. No calf tenderness Skin: No cyanosis. No rashes Psychiatry: Judgement and  insight appear normal. Mood & affect depressed and flat.     Data Reviewed: I have personally reviewed following labs and imaging studies  CBC:  Recent Labs Lab 07/26/16 1950 07/28/16 0754 07/29/16 0518  WBC 16.3* 12.2* 13.0*  NEUTROABS 13.3*  --   --   HGB 7.8* 7.0* 7.0*  HCT 24.1* 22.3*  22.8*  MCV 92.0 95.3 94.2  PLT 454* 483* 161   Basic Metabolic Panel:  Recent Labs Lab 07/26/16 1950 08/04/2016 1829 07/28/16 0754 07/29/16 0518  NA 132*  --  138 139  K 3.1*  --  3.4* 3.2*  CL 91*  --  104 105  CO2 26  --  22 25  GLUCOSE 129*  --  106* 114*  BUN 63*  --  42* 30*  CREATININE 2.19*  --  1.07* 0.85  CALCIUM 8.5*  --  7.9* 8.1*  MG  --  1.4* 2.0  --   PHOS  --  2.4*  --   --    GFR: Estimated Creatinine Clearance: 74.4 mL/min (by C-G formula based on SCr of 0.85 mg/dL). Liver Function Tests:  Recent Labs Lab 07/26/16 1950 07/28/16 0754  AST 89* 115*  ALT 27 32  ALKPHOS 140* 112  BILITOT 1.3* 1.0  PROT 8.6* 7.8  ALBUMIN 2.2* 1.8*   No results for input(s): LIPASE, AMYLASE in the last 168 hours. No results for input(s): AMMONIA in the last 168 hours. Coagulation Profile:  Recent Labs Lab 08/11/2016 0408 07/28/16 0754 07/29/16 0518 07/30/16 0333  INR 3.90 4.40* 3.93 4.40*   Cardiac Enzymes: No results for input(s): CKTOTAL, CKMB, CKMBINDEX, TROPONINI in the last 168 hours. BNP (last 3 results) No results for input(s): PROBNP in the last 8760 hours. HbA1C: No results for input(s): HGBA1C in the last 72 hours. CBG: No results for input(s): GLUCAP in the last 168 hours. Lipid Profile: No results for input(s): CHOL, HDL, LDLCALC, TRIG, CHOLHDL, LDLDIRECT in the last 72 hours. Thyroid Function Tests: No results for input(s): TSH, T4TOTAL, FREET4, T3FREE, THYROIDAB in the last 72 hours. Anemia Panel: No results for input(s): VITAMINB12, FOLATE, FERRITIN, TIBC, IRON, RETICCTPCT in the last 72 hours. Sepsis Labs:  Recent Labs Lab 07/26/16 2019 08/14/2016 0417  LATICACIDVEN 1.84 1.40    Recent Results (from the past 240 hour(s))  Culture, Urine     Status: Abnormal   Collection Time: 08/04/2016  2:58 AM  Result Value Ref Range Status   Specimen Description URINE, CATHETERIZED  Final   Special Requests NONE  Final   Culture >=100,000 COLONIES/mL  ESCHERICHIA COLI (A)  Final   Report Status 07/30/2016 FINAL  Final   Organism ID, Bacteria ESCHERICHIA COLI (A)  Final      Susceptibility   Escherichia coli - MIC*    AMPICILLIN >=32 RESISTANT Resistant     CEFAZOLIN <=4 SENSITIVE Sensitive     CEFTRIAXONE <=1 SENSITIVE Sensitive     CIPROFLOXACIN >=4 RESISTANT Resistant     GENTAMICIN <=1 SENSITIVE Sensitive     IMIPENEM <=0.25 SENSITIVE Sensitive     NITROFURANTOIN <=16 SENSITIVE Sensitive     TRIMETH/SULFA <=20 SENSITIVE Sensitive     AMPICILLIN/SULBACTAM 8 SENSITIVE Sensitive     PIP/TAZO <=4 SENSITIVE Sensitive     Extended ESBL NEGATIVE Sensitive     * >=100,000 COLONIES/mL ESCHERICHIA COLI         Radiology Studies: Dg Chest 2 View  Result Date: 07/28/2016 CLINICAL DATA:  Pleural effusion EXAM: CHEST  2 VIEW  COMPARISON:  CT chest dated 06/17/2016 FINDINGS: Complete opacification of the left hemithorax. Right lung is clear. No pneumothorax pre The heart is normal in size. IMPRESSION: Complete opacification of the left hemithorax. This appearance is nonspecific but could reflect a large pleural effusion, as per the provided clinical history. Electronically Signed   By: Julian Hy M.D.   On: 07/28/2016 18:06   Ct Chest W Contrast  Result Date: 07/29/2016 CLINICAL DATA:  Abnormal chest x-ray with opacification of left hemithorax. EXAM: CT CHEST WITH CONTRAST TECHNIQUE: Multidetector CT imaging of the chest was performed during intravenous contrast administration. CONTRAST:  13mL ISOVUE-300 IOPAMIDOL (ISOVUE-300) INJECTION 61% COMPARISON:  Chest radiograph yesterday.  Chest CT 06/17/2016 FINDINGS: Cardiovascular: Tortuous thoracic aorta with mild atherosclerosis. There are coronary artery calcifications. Heart normal in size, mild rightward mediastinal shift. Mediastinum/Nodes: Rightward mediastinal shift due to right pleural effusion. Left lower paratracheal adenopathy appears grossly similar, no motor confluent nodes  measuring 2.6 x 0.8 cm. No additional mediastinal adenopathy. No hilar adenopathy. Thyroid nodule in the right lobe is subcentimeter and does not meet criteria for biopsy or need further evaluation. Probable intraluminal mucus/debris in the upper trachea image 24 series 7. Lungs/Pleura: Large left pleural effusion, mildly heterogeneous near completely filling the left hemithorax. There is complete atelectasis of the left lung. Small right pleural effusion with adjacent compressive atelectasis. No pulmonary edema. Upper Abdomen: There are scattered low-density lesions throughout the liver, ill-defined, largest measuring 18 mm in the subcapsular right lobe. Nodular hepatic contour suggests cirrhosis. Moderate abdominal ascites in the visualized upper abdomen. Musculoskeletal: Chronic deformities of left lateral ribs may be sequela of remote prior injury There are no acute or suspicious osseous abnormalities. IMPRESSION: 1. Large left pleural effusion causing complete atelectasis of the left lung and rightward mediastinal shift. 2. Small right pleural effusion and adjacent atelectasis. 3. Moderate upper abdominal ascites. Multiple low-density lesions in the liver nonspecific. There is question of nodular hepatic contours raising concern for cirrhosis. Recommend correlation with cirrhosis risk factors. Hepatic protocol MRI recommended for further characterization when patient is able tolerate breath hold technique. 4. Coronary artery calcifications. Mild thoracic aortic atherosclerosis per Aortic Atherosclerosis (ICD10-I70.0). Electronically Signed   By: Jeb Levering M.D.   On: 07/29/2016 22:18   Dg Abd Portable 1v  Result Date: 07/28/2016 CLINICAL DATA:  Abdominal pain with decreased bowel sounds and nausea. Abdominal distention. EXAM: PORTABLE ABDOMEN - 1 VIEW COMPARISON:  CT scans of the chest dated 06/17/2016 and of the pelvis dated 06/12/2016 and chest x-ray dated 06/15/2016 FINDINGS: There is minimal air  in nondistended bowel. There is overall increased density in the abdomen consistent with ascites or fluid filled bowel loops. There is a large left pleural effusion with complete opacification of the visualized portion of the left hemithorax. IVC filter in place. No acute bone abnormality. IMPRESSION: 1. Increased density in the abdomen suggestive of ascites and/or fluid filled bowel loops. No dilated bowel. 2. Large left pleural effusion. Electronically Signed   By: Lorriane Shire M.D.   On: 07/28/2016 11:57        Scheduled Meds: . amitriptyline  25 mg Oral QHS  . aspirin EC  81 mg Oral Daily  . atorvastatin  40 mg Oral QHS  . cloNIDine  0.2 mg Oral TID  . DULoxetine  20 mg Oral QHS  . feeding supplement (ENSURE ENLIVE)  237 mL Oral BID BM  . gabapentin  400 mg Oral TID  . metoprolol tartrate  100 mg Oral  BID  . pantoprazole  40 mg Oral BID  . phenytoin  100 mg Oral TID   Continuous Infusions: . sodium chloride 125 mL/hr at 07/30/16 0123  . cefTRIAXone (ROCEPHIN)  IV Stopped (07/30/16 0636)     LOS: 3 days     Cordelia Poche, MD [facility] 07/30/2016, 11:10 AM Pager: [phone]  If 7PM-7AM, please contact night-coverage [email or web address] Password [email or web address] 07/30/2016, 11:10 AM

## 2016-07-30 NOTE — Progress Notes (Signed)
ANTICOAGULATION CONSULT NOTE - Follow Up Consult  Pharmacy Consult for Coumadin Indication: hx DVT  No Known Allergies  Patient Measurements: Height: 5\' 7"  (170.2 cm) Weight: 150 lb (68 kg) IBW/kg (Calculated) : 61.6   Vital Signs: Temp: 98.3 F (36.8 C) (07/13 0607) BP: 108/88 (07/13 0911) Pulse Rate: 104 (07/13 0911)  Labs:  Recent Labs  07/28/16 0754 07/29/16 0518 07/30/16 0333  HGB 7.0* 7.0*  --   HCT 22.3* 22.8*  --   PLT 483* 386  --   LABPROT 43.2* 39.4* 43.2*  INR 4.40* 3.93 4.40*  CREATININE 1.07* 0.85  --     Estimated Creatinine Clearance: 74.4 mL/min (by C-G formula based on SCr of 0.85 mg/dL).   Medications:  Scheduled:  . amitriptyline  25 mg Oral QHS  . aspirin EC  81 mg Oral Daily  . atorvastatin  40 mg Oral QHS  . cloNIDine  0.2 mg Oral TID  . DULoxetine  20 mg Oral QHS  . feeding supplement (ENSURE ENLIVE)  237 mL Oral BID BM  . gabapentin  400 mg Oral TID  . metoprolol tartrate  100 mg Oral BID  . pantoprazole  40 mg Oral BID  . phenytoin  100 mg Oral TID    Assessment: 56yo female with hx of DVT on Coumadin pta, which has been held since admission due to elevated INRs.  INR remains elevated today, actually increasedthough decreased since admit.  No bleeding.   Goal of Therapy:  INR 2-3 Monitor platelets by anticoagulation protocol: Yes   Plan:  No Coumadin today F/U in AM  Lewie Chamber., PharmD [facility]

## 2016-07-30 NOTE — Progress Notes (Addendum)
Patient brought to radiology department for possible thoracentesis.  INR noted to be 4.4 today.  Patient is on coumadin which is currently being held.  Performed Limited US chest to survey for fluid.  Results to be dictated separately.   Spoke with Dr. Lonny Prude.  Informed of need for new order if thoracentesis still warranted.   Brynda Greathouse, MMS RDN PA-C 2:06 PM

## 2016-07-30 NOTE — Progress Notes (Signed)
Nutrition Follow-up  DOCUMENTATION CODES:   Not applicable  INTERVENTION:   -D/c Ensure Enlive po BID, each supplement provides 350 kcal and 20 grams of protein -Boost Breeze po TID, each supplement provides 250 kcal and 9 grams of protein  NUTRITION DIAGNOSIS:   Inadequate oral intake related to acute illness as evidenced by meal completion < 25%.  Ongoing  GOAL:   Patient will meet greater than or equal to 90% of their needs  Unmet  MONITOR:   PO intake, Supplement acceptance, Labs, Weight trends  REASON FOR ASSESSMENT:   Rounds    ASSESSMENT:    56 y.o. female with medical history significant of seizures, DVT on Coumadin, CVA with residual right-sided weakness, hypertension. Patient presenting with several day history of nausea and vomiting.  Pt seen at request of RN, who reports pt is not taking Ensure supplements.   Spoke with pt, who reports good tolerance of diet. However, noted meal completion is poor (PO: 0-25%). Pt had an unopened carton of cereal and 50% completed fruit cup at bedside. Pt states "I don't like Ensure, I never have and I never will". Discussed importance of good nutritional intake to promote healing. Pt shares she typically does not like milk-based drinks. Reviewed supplements available on the formulary; pt amenable to [facility].   Labs reviewed: K: 3.2.   Diet Order:  Diet Heart Room service appropriate? Yes; Fluid consistency: Thin  Skin:  Reviewed, no issues  Last BM:  07/30/16  Height:   Ht Readings from Last 1 Encounters:  07/26/16 5\' 7"  (1.702 m)    Weight:   Wt Readings from Last 1 Encounters:  07/26/16 150 lb (68 kg)    Ideal Body Weight:  61.3 kg  BMI:  Body mass index is 23.49 kg/m.  Estimated Nutritional Needs:   Kcal:  1600-1800  Protein:  80-95 grams  Fluid:  > 1.6 L  EDUCATION NEEDS:   Education needs no appropriate at this time  Ann Lynch A. Jimmye Norman, RD, LDN, CDE Pager: [phone] After hours Pager:  [phone]

## 2016-07-30 NOTE — Care Management Important Message (Signed)
Important Message  Patient Details  Name: Ann Lynch MRN: [id number] Date of Birth: 04-Oct-1960   Medicare Important Message Given:  Yes    Orbie Pyo 07/30/2016, 2:07 PM

## 2016-07-30 NOTE — Progress Notes (Signed)
   07/30/16 0911  Vitals  BP 108/88  MAP (mmHg) 93  BP Location Left Arm  BP Method Automatic  Patient Position (if appropriate) Sitting  Pulse Rate (!) 104   Pt. Scheduled to received Lopressor 100 mg and Clonidine 0.2 mg.  Informed Dr. Lonny Prude during rounds and verbal order given to hold the two above medications this am.  Will continue to monitor.  Alphonzo Lemmings, RN

## 2016-07-30 NOTE — Progress Notes (Signed)
Physical Therapy Treatment Patient Details Name: Ann Lynch MRN: [id number] DOB: [birth year] Today's Date: 07/30/2016    History of Present Illness Ann Lynch is a 56 y.o. 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 She presented with nausea and vomiting and found to have a urinary tract infection.    PT Comments    Limited by pt's fatigue.  I don't feel pt understands the importance of being mobile over an extended period.  Emphasis on transition supine to sitting EOB building trunk control/ ability to utilize left  UE and transfer technique.   Follow Up Recommendations  No PT follow up     Equipment Recommendations  None recommended by PT    Recommendations for Other Services       Precautions / Restrictions Precautions Precautions: Fall    Mobility  Bed Mobility Overal bed mobility: Needs Assistance Bed Mobility: Supine to Sit     Supine to sit: Mod assist     General bed mobility comments: pt needing cues for sequencing and direction plus stability and scoot assist.  Transfers Overall transfer level: Needs assistance Equipment used: 1 person hand held assist Transfers: Sit to/from Stand;Stand Pivot Transfers Sit to Stand: Mod assist Stand pivot transfers: Mod assist       General transfer comment: bed to chair transfer with stability assist and w/shift support.  pt would perform closer to a min guard level with squat pivot to the chair.  Ambulation/Gait                 Stairs            Wheelchair Mobility    Modified Rankin (Stroke Patients Only)       Balance Overall balance assessment: Needs assistance   Sitting balance-Leahy Scale: Fair Sitting balance - Comments: able to sit EOB without assist, but fatigues quickly and lists to the right.     Standing balance-Leahy Scale: Poor Standing balance comment: needs external support to maintain standing at  EOB before transfer to chair.                            Cognition Arousal/Alertness: Awake/alert Behavior During Therapy: WFL for tasks assessed/performed Overall Cognitive Status: Within Functional Limits for tasks assessed                                        Exercises      General Comments        Pertinent Vitals/Pain Pain Assessment: No/denies pain    Home Living                      Prior Function            PT Goals (current goals can now be found in the care plan section) Acute Rehab PT Goals Patient Stated Goal: to have a bowel movement PT Goal Formulation: With patient Time For Goal Achievement: 08/04/16 Potential to Achieve Goals: Good Progress towards PT goals: Progressing toward goals    Frequency    Min 2X/week      PT Plan Current plan remains appropriate    Co-evaluation              AM-PAC PT "6 Clicks" Daily Activity  Outcome Measure  Difficulty turning over in  bed (including adjusting bedclothes, sheets and blankets)?: Total Difficulty moving from lying on back to sitting on the side of the bed? : Total Difficulty sitting down on and standing up from a chair with arms (e.g., wheelchair, bedside commode, etc,.)?: Total Help needed moving to and from a bed to chair (including a wheelchair)?: A Lot Help needed walking in hospital room?: Total Help needed climbing 3-5 steps with a railing? : Total 6 Click Score: 7    End of Session   Activity Tolerance: Patient tolerated treatment well;Patient limited by fatigue Patient left: in chair;with call bell/phone within reach;with chair alarm set Nurse Communication: Mobility status PT Visit Diagnosis: Other abnormalities of gait and mobility (R26.89)     Time: 6579-0383 PT Time Calculation (min) (ACUTE ONLY): 13 min  Charges:  $Therapeutic Activity: 8-22 mins                    G Codes:       07/31/2016  Ann Lynch,  PT [phone]  (pager)   Ann Lynch 07/31/16, 4:27 PM

## 2016-07-31 ENCOUNTER — Inpatient Hospital Stay (HOSPITAL_COMMUNITY): Payer: Medicare HMO

## 2016-07-31 ENCOUNTER — Encounter (HOSPITAL_COMMUNITY): Payer: Self-pay | Admitting: Gastroenterology

## 2016-07-31 DIAGNOSIS — R10817 Generalized abdominal tenderness: Secondary | ICD-10-CM

## 2016-07-31 LAB — HEPATITIS PANEL, ACUTE
HCV Ab: 0.1 s/co ratio (ref 0.0–0.9)
HEP A IGM: NEGATIVE
Hep B C IgM: NEGATIVE
Hepatitis B Surface Ag: NEGATIVE

## 2016-07-31 LAB — COMPREHENSIVE METABOLIC PANEL
ALT: 56 U/L — AB (ref 14–54)
AST: 213 U/L — AB (ref 15–41)
Albumin: 1.7 g/dL — ABNORMAL LOW (ref 3.5–5.0)
Alkaline Phosphatase: 132 U/L — ABNORMAL HIGH (ref 38–126)
Anion gap: 7 (ref 5–15)
BILIRUBIN TOTAL: 0.7 mg/dL (ref 0.3–1.2)
BUN: 21 mg/dL — AB (ref 6–20)
CALCIUM: 8.4 mg/dL — AB (ref 8.9–10.3)
CO2: 24 mmol/L (ref 22–32)
CREATININE: 0.93 mg/dL (ref 0.44–1.00)
Chloride: 110 mmol/L (ref 101–111)
GFR calc Af Amer: >60 mL/min (ref >60)
Glucose, Bld: 114 mg/dL — ABNORMAL HIGH (ref 65–99)
POTASSIUM: 4.1 mmol/L (ref 3.5–5.1)
Sodium: 141 mmol/L (ref 135–145)
TOTAL PROTEIN: 7.5 g/dL (ref 6.5–8.1)

## 2016-07-31 LAB — CBC
HEMATOCRIT: 22.3 % — AB (ref 36.0–46.0)
Hemoglobin: 6.9 g/dL — CL (ref 12.0–15.0)
MCH: 28.9 pg (ref 26.0–34.0)
MCHC: 30.5 g/dL (ref 30.0–36.0)
MCV: 94.9 fL (ref 78.0–100.0)
Platelets: 419 10*3/uL — ABNORMAL HIGH (ref 150–400)
RBC: 2.35 MIL/uL — ABNORMAL LOW (ref 3.87–5.11)
RDW: 19.8 % — AB (ref 11.5–15.5)
WBC: 15.2 10*3/uL — AB (ref 4.0–10.5)

## 2016-07-31 LAB — PROTIME-INR
INR: 4.5
Prothrombin Time: 43.4 seconds — ABNORMAL HIGH (ref 11.4–15.2)

## 2016-07-31 LAB — AMMONIA: AMMONIA: 40 umol/L — AB (ref 9–35)

## 2016-07-31 LAB — PREPARE RBC (CROSSMATCH)

## 2016-07-31 MED ORDER — IOPAMIDOL (ISOVUE-300) INJECTION 61%
INTRAVENOUS | Status: AC
  Filled 2016-07-31: qty 100

## 2016-07-31 MED ORDER — SODIUM CHLORIDE 0.9 % IV SOLN
Freq: Once | INTRAVENOUS | Status: AC
  Administered 2016-07-31: 12:00:00 via INTRAVENOUS

## 2016-07-31 MED ORDER — IOPAMIDOL (ISOVUE-300) INJECTION 61%
INTRAVENOUS | Status: AC
  Administered 2016-07-31: 100 mL via INTRAVENOUS
  Filled 2016-07-31: qty 30

## 2016-07-31 MED ORDER — FUROSEMIDE 10 MG/ML IJ SOLN
40.0000 mg | Freq: Every day | INTRAMUSCULAR | Status: DC
  Administered 2016-07-31 – 2016-08-01 (×2): 40 mg via INTRAVENOUS
  Filled 2016-07-31 (×2): qty 4

## 2016-07-31 NOTE — Consult Note (Signed)
Reason for Consult: Ascites Referring Physician: Hospital team  Ann Lynch is an 56 y.o. female.  HPI: Patient seen and examined and hospital computer chart reviewed and x-rays reviewed with radiology Dr. Ardeen Garland and case discussed with the hospital team as well as her 2 sisters and she actually was fine until a month ago when she fell and she lost her appetite and began losing weight and having increased stomach swellings percent and she's not had any previous GI workup and her CT is pertinent for the chest effusion as well as ascites and possible liver metastases and possibly a mass in her distal stomach or pancreas not mentioned on the report and she has been having some left-sided abdominal pain but no history of previous liver disease  Past Medical History:  Diagnosis Date  . Anxiety   . Chronic pain   . Depression   . Depression with anxiety   . DVT (deep venous thrombosis) ([place]) dx'd 05/2016   BLE  . GERD (gastroesophageal reflux disease)   . High cholesterol   . Hypertension   . Protein calorie malnutrition ([place])   . Pyelonephritis 07/25/2016  . Stroke [facility]) 04/2007   "weak right side; some memory issues since" (08/16/2016)    Past Surgical History:  Procedure Laterality Date  . CESAREAN SECTION  1994  . DILATION AND CURETTAGE OF UTERUS    . TONSILLECTOMY  1973  . TRACHEOSTOMY    . TUBAL LIGATION  1994    Family History  Problem Relation Age of Onset  . Hypertension Mother   . Diabetes Mellitus II Mother   . Hypertension Father     Social History:  reports that she quit smoking about 9 years ago. Her smoking use included Cigarettes. She has a 14.00 pack-year smoking history. She has never used smokeless tobacco. She reports that she does not drink alcohol or use drugs.  Allergies: No Known Allergies  Medications: I have reviewed the patient's current medications.  Results for orders placed or performed during the hospital encounter of 07/22/2016 (from the  past 48 hour(s))  Protime-INR     Status: Abnormal   Collection Time: 07/30/16  3:33 AM  Result Value Ref Range   Prothrombin Time 43.2 (H) 11.4 - 15.2 seconds   INR 4.40 (HH)     Comment: REPEATED TO VERIFY CRITICAL RESULT CALLED TO, READ BACK BY AND VERIFIED WITH: S.COLUMBRES,RN 3244 07/30/16 G.MCADOO   hCG, quantitative, pregnancy     Status: Abnormal   Collection Time: 07/30/16  3:33 AM  Result Value Ref Range   hCG, Beta Chain, Quant, S 1,038 (H) <5 mIU/mL    Comment:          GEST. AGE      CONC.  (mIU/mL)   <=1 WEEK        5 - 50     2 WEEKS       50 - 500     3 WEEKS       100 - 10,000     4 WEEKS     1,000 - 30,000     5 WEEKS     3,500 - 115,000   6-8 WEEKS     12,000 - 270,000    12 WEEKS     15,000 - 220,000        FEMALE AND NON-PREGNANT FEMALE:     LESS THAN 5 mIU/mL   Hepatitis panel, acute     Status: None   Collection Time:  07/30/16 10:05 AM  Result Value Ref Range   Hepatitis B Surface Ag Negative Negative   HCV Ab 0.1 0.0 - 0.9 s/co ratio    Comment: (NOTE)                                  Negative:     < 0.8                             Indeterminate: 0.8 - 0.9                                  Positive:     > 0.9 The CDC recommends that a positive HCV antibody result be followed up with a HCV Nucleic Acid Amplification test (160109). Performed At: [facility] [place], [place] [id number] Lindon Romp MD [id number]    Hep A IgM Negative Negative   Hep B C IgM Negative Negative  Protime-INR     Status: Abnormal   Collection Time: 07/31/16  5:54 AM  Result Value Ref Range   Prothrombin Time 43.4 (H) 11.4 - 15.2 seconds   INR 4.50 (HH)     Comment: REPEATED TO VERIFY CRITICAL RESULT CALLED TO, READ BACK BY AND VERIFIED WITH: H NGYUEN RN [id number] BY SHORTT   CBC     Status: Abnormal   Collection Time: 07/31/16  5:54 AM  Result Value Ref Range   WBC 15.2 (H) 4.0 - 10.5 K/uL   RBC 2.35 (L) 3.87 - 5.11 MIL/uL    Hemoglobin 6.9 (LL) 12.0 - 15.0 g/dL    Comment: REPEATED TO VERIFY SPECIMEN CHECKED FOR CLOTS CRITICAL RESULT CALLED TO, READ BACK BY AND VERIFIED WITH: H NGUYEN RN [id number] BY SHORTT    HCT 22.3 (L) 36.0 - 46.0 %   MCV 94.9 78.0 - 100.0 fL   MCH 28.9 26.0 - 34.0 pg   MCHC 30.5 30.0 - 36.0 g/dL   RDW 19.8 (H) 11.5 - 15.5 %   Platelets 419 (H) 150 - 400 K/uL  Comprehensive metabolic panel     Status: Abnormal   Collection Time: 07/31/16  5:54 AM  Result Value Ref Range   Sodium 141 135 - 145 mmol/L   Potassium 4.1 3.5 - 5.1 mmol/L   Chloride 110 101 - 111 mmol/L   CO2 24 22 - 32 mmol/L   Glucose, Bld 114 (H) 65 - 99 mg/dL   BUN 21 (H) 6 - 20 mg/dL   Creatinine, Ser 0.93 0.44 - 1.00 mg/dL   Calcium 8.4 (L) 8.9 - 10.3 mg/dL   Total Protein 7.5 6.5 - 8.1 g/dL   Albumin 1.7 (L) 3.5 - 5.0 g/dL   AST 213 (H) 15 - 41 U/L   ALT 56 (H) 14 - 54 U/L   Alkaline Phosphatase 132 (H) 38 - 126 U/L   Total Bilirubin 0.7 0.3 - 1.2 mg/dL   GFR calc non Af Amer >60 >60 mL/min   GFR calc Af Amer >60 >60 mL/min    Comment: (NOTE) The eGFR has been calculated using the CKD EPI equation. This calculation has not been validated in all clinical situations. eGFR's persistently <60 mL/min signify possible Chronic Kidney Disease.    Anion gap 7 5 - 15  Prepare RBC  Status: None   Collection Time: 07/31/16  9:38 AM  Result Value Ref Range   Order Confirmation ORDER PROCESSED BY BLOOD BANK   Type and screen [place]     Status: None (Preliminary result)   Collection Time: 07/31/16  9:40 AM  Result Value Ref Range   ABO/RH(D) O POS    Antibody Screen NEG    Sample Expiration 08/03/2016    Unit Number [id number]    Blood Component Type RED CELLS,LR    Unit division 00    Status of Unit ALLOCATED    Transfusion Status OK TO TRANSFUSE    Crossmatch Result Compatible    Unit Number [id number]    Blood Component Type RED CELLS,LR    Unit division 00    Status of  Unit ISSUED    Transfusion Status OK TO TRANSFUSE    Crossmatch Result Compatible     Ct Chest W Contrast  Result Date: 07/29/2016 CLINICAL DATA:  Abnormal chest x-ray with opacification of left hemithorax. EXAM: CT CHEST WITH CONTRAST TECHNIQUE: Multidetector CT imaging of the chest was performed during intravenous contrast administration. CONTRAST:  57m ISOVUE-300 IOPAMIDOL (ISOVUE-300) INJECTION 61% COMPARISON:  Chest radiograph yesterday.  Chest CT 06/17/2016 FINDINGS: Cardiovascular: Tortuous thoracic aorta with mild atherosclerosis. There are coronary artery calcifications. Heart normal in size, mild rightward mediastinal shift. Mediastinum/Nodes: Rightward mediastinal shift due to right pleural effusion. Left lower paratracheal adenopathy appears grossly similar, no motor confluent nodes measuring 2.6 x 0.8 cm. No additional mediastinal adenopathy. No hilar adenopathy. Thyroid nodule in the right lobe is subcentimeter and does not meet criteria for biopsy or need further evaluation. Probable intraluminal mucus/debris in the upper trachea image 24 series 7. Lungs/Pleura: Large left pleural effusion, mildly heterogeneous near completely filling the left hemithorax. There is complete atelectasis of the left lung. Small right pleural effusion with adjacent compressive atelectasis. No pulmonary edema. Upper Abdomen: There are scattered low-density lesions throughout the liver, ill-defined, largest measuring 18 mm in the subcapsular right lobe. Nodular hepatic contour suggests cirrhosis. Moderate abdominal ascites in the visualized upper abdomen. Musculoskeletal: Chronic deformities of left lateral ribs may be sequela of remote prior injury There are no acute or suspicious osseous abnormalities. IMPRESSION: 1. Large left pleural effusion causing complete atelectasis of the left lung and rightward mediastinal shift. 2. Small right pleural effusion and adjacent atelectasis. 3. Moderate upper abdominal  ascites. Multiple low-density lesions in the liver nonspecific. There is question of nodular hepatic contours raising concern for cirrhosis. Recommend correlation with cirrhosis risk factors. Hepatic protocol MRI recommended for further characterization when patient is able tolerate breath hold technique. 4. Coronary artery calcifications. Mild thoracic aortic atherosclerosis per Aortic Atherosclerosis (ICD10-I70.0). Electronically Signed   By: MJeb LeveringM.D.   On: 07/29/2016 22:18   [facility] [place]  Result Date: 07/30/2016 CLINICAL DATA:  Evaluate for thoracentesis.  INR is greater than 4. EXAM: CHEST ULTRASOUND COMPARISON:  None. FINDINGS: A moderate left pleural effusion is present. IMPRESSION: Moderate left pleural effusion. Electronically Signed   By: AMarybelle KillingsM.D.   On: 07/30/2016 14:19    ROS negative except above however her speech is slurred which is new over the last month and her energy is much less as well Blood pressure 124/87, pulse 100, temperature 99.1 F (37.3 C), temperature source Oral, resp. rate 20, height 5' 7"  (1.702 m), weight 68 kg (150 lb), SpO2 95 %. Physical Exam patient lying comfortably in the bed slowly responsive although eyes  opened exam pertinent for her abdomen being slightly distended no obvious tenderness except minimal on the left signed labs and CT reviewed increased INR and Coumadin has been held for a while but platelet count normal elevated hCG of questionable significance  Assessment/Plan: Multiple medical problems including my initial concerns about widely metastatic cancer Plan: Await MRI and CT scan and expect she will need either paracentesis thoracentesis for biopsy of something soon and possibly even an endoscopy if a stomach lesion shows up on CT and will follow with you as above but expect her to need to be reversed fairly soon once radiographic diagnosis made and although I do not think she probably has inherited liver disease we can proceed  with the serologic workup if nothing shows up as above [place] E 07/31/2016, 12:37 PM

## 2016-07-31 NOTE — Progress Notes (Signed)
CRITICAL VALUE ALERT  Critical Value:  hgb 6.9  Date & Time Notied:  07/31/16 0749  Provider Notified: Georgena Spurling  Orders Received/Actions taken: pending action, pass to incoming nurse

## 2016-07-31 NOTE — Progress Notes (Signed)
CRITICAL VALUE ALERT  Critical Value:   INR 4.5  Date & Time Notied:  07/31/16 0747  Provider Notified: Georgena Spurling  Orders Received/Actions taken: pending

## 2016-07-31 NOTE — Progress Notes (Signed)
ANTICOAGULATION CONSULT NOTE - Follow Up Consult  Pharmacy Consult for Coumadin Indication: hx DVT  No Known Allergies  Patient Measurements: Height: 5\' 7"  (170.2 cm) Weight: 150 lb (68 kg) IBW/kg (Calculated) : 61.6   Vital Signs: Temp: 98.3 F (36.8 C) (07/14 0545) Temp Source: Oral (07/14 0545) BP: 122/78 (07/14 0545) Pulse Rate: 107 (07/14 0545)  Labs:  Recent Labs  07/28/16 0754 07/29/16 0518 07/30/16 0333 07/31/16 0554  HGB 7.0* 7.0*  --  6.9*  HCT 22.3* 22.8*  --  22.3*  PLT 483* 386  --  419*  LABPROT 43.2* 39.4* 43.2* 43.4*  INR 4.40* 3.93 4.40* 4.50*  CREATININE 1.07* 0.85  --  0.93    Estimated Creatinine Clearance: 68 mL/min (by C-G formula based on SCr of 0.93 mg/dL).   Medications:  Scheduled:  . amitriptyline  25 mg Oral QHS  . aspirin EC  81 mg Oral Daily  . atorvastatin  40 mg Oral QHS  . cloNIDine  0.2 mg Oral TID  . DULoxetine  20 mg Oral QHS  . feeding supplement  1 Container Oral TID BM  . gabapentin  400 mg Oral TID  . metoprolol tartrate  100 mg Oral BID  . pantoprazole  40 mg Oral BID  . phenytoin  100 mg Oral TID    Assessment: 56yo female with hx of DVT on Coumadin pta, which has been held since admission due to elevated INRs.  INR remains elevated today at 4.5. hgb remains low, down slightly to 6.9. No bleeding per chart.   Goal of Therapy:  INR 2-3 Monitor platelets by anticoagulation protocol: Yes   Plan:  No Coumadin today F/U in AM  Maryanna Shape, PharmD, BCPS  Clinical Pharmacist  Pager: [phone]

## 2016-07-31 NOTE — Progress Notes (Signed)
PROGRESS NOTE    Ann Lynch  GNO:[id number] DOB: 10/16/1960 DOA: 08/02/2016 PCP: Charolette Forward, MD   Brief Narrative: Ann Lynch is a 56 y.o. 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 She presented with nausea and vomiting and found to have a urinary tract infection.   Assessment & Plan:  Active Problems:   AKI (acute kidney injury) ([place])   Seizures (HCC)   Supratherapeutic INR   Pyelonephritis   Intractable nausea and vomiting   Chronic deep vein thrombosis (DVT) (HCC)   Positive pregnancy test   Hypokalemia   Severe protein-calorie malnutrition [name]: less than 60% of standard weight) (HCC)   History of CVA (cerebrovascular accident)   UTI Intractable nausea/vomiting Vomiting improved. Urine culture significant for E. Coli sensitive to ceftriaxone -Continue ceftriaxone  Pelvic fluid Elevated B-hcg Discussed with Dr. Arlina Robes of OB/Gyn faculty practice who recommends repeat b-hcg in 48 hours. Repeat slightly downtrending. Recommending outpatient follow-up.  Pleural effusion Ascites Possibly secondary to liver disease. Evidence of possible cirrhosis on CT chest. Hepatitis panel negative. -thoracentesis with labs -MRI abdomen -lasix -ammonia -gastroenterology consult  Anemia Chronic. Patient is perimenopausal. Hemoglobin dropped down to 6.9. Patient is asymptomatic. INR is supratherapeutic with no evidence of bleeding on exam or history. -Repeat CBC in the morning -FOBT -2 units PRBC  History of proximal DVT involving iliac, common femoral, femoral and popliteal veins bilaterally Supratherapeutic INR Patient on coumadin. INR supratherapeutic. Possibly secondary to liver disease. -warfarin per pharmacy -hesitant to reverse anticoagulation in setting of multiple acute DVTs less than 3 months ago. -elevate legs  History of CVA Patient with residual right-sided  deficits -continue aspirin  Depression Anxiety -continue Cymbalta -continue amitriptyline  Seizure disorder -continue dilantin ER  Severe protein calorie malnutrition -Dietitian consult  Essential hypertension -metoprolol  Acute kidney injury Improved with IV fluids  Decreased bowel sounds Abdominal distension -CT abdomen w/contrast   DVT prophylaxis: SCDs Code Status: Full code Family Communication: Mother and sister at bedside Disposition Plan: Discharge pending medical stability   Consultants:   Gynecology  Gastroenterology  Procedures:   None  Antimicrobials:  Ceftriaxone    Subjective: Abdominal pain  Objective: Vitals:   07/30/16 2112 07/30/16 2118 07/31/16 0545 07/31/16 0930  BP: (!) 137/97 (!) 133/103 122/78 120/83  Pulse:  (!) 132 (!) 107 (!) 115  Resp:  20 19   Temp:  98.6 F (37 C) 98.3 F (36.8 C)   TempSrc:   Oral   SpO2:  96% 95%   Weight:      Height:        Intake/Output Summary (Last 24 hours) at 07/31/16 1129 Last data filed at 07/31/16 0929  Gross per 24 hour  Intake                0 ml  Output                0 ml  Net                0 ml   Filed Weights   07/26/16 1941  Weight: 68 kg (150 lb)    Examination:  General exam: Appears calm and comfortable Respiratory system: Decreased breath sounds bilaterally L>R right crackles on left Cardiovascular system: Regular rate and rhythm. Normal S1 and S2. No heart murmurs present. No extra heart sounds Gastrointestinal system: Soft with generalized tenderness and associated guarding. Central nervous system: alert and oriented. Not baseline  per family Extremities: No edema. No calf tenderness Skin: No cyanosis. No rashes Psychiatry: Judgement and insight appear normal. Mood & affect depressed and flat.     Data Reviewed: I have personally reviewed following labs and imaging studies  CBC:  Recent Labs Lab 07/26/16 1950 07/28/16 0754 07/29/16 0518 07/31/16 0554   WBC 16.3* 12.2* 13.0* 15.2*  NEUTROABS 13.3*  --   --   --   HGB 7.8* 7.0* 7.0* 6.9*  HCT 24.1* 22.3* 22.8* 22.3*  MCV 92.0 95.3 94.2 94.9  PLT 454* 483* 386 440*   Basic Metabolic Panel:  Recent Labs Lab 07/26/16 1950 07/26/2016 1829 07/28/16 0754 07/29/16 0518 07/31/16 0554  NA 132*  --  138 139 141  K 3.1*  --  3.4* 3.2* 4.1  CL 91*  --  104 105 110  CO2 26  --  22 25 24   GLUCOSE 129*  --  106* 114* 114*  BUN 63*  --  42* 30* 21*  CREATININE 2.19*  --  1.07* 0.85 0.93  CALCIUM 8.5*  --  7.9* 8.1* 8.4*  MG  --  1.4* 2.0  --   --   PHOS  --  2.4*  --   --   --    GFR: Estimated Creatinine Clearance: 68 mL/min (by C-G formula based on SCr of 0.93 mg/dL). Liver Function Tests:  Recent Labs Lab 07/26/16 1950 07/28/16 0754 07/31/16 0554  AST 89* 115* 213*  ALT 27 32 56*  ALKPHOS 140* 112 132*  BILITOT 1.3* 1.0 0.7  PROT 8.6* 7.8 7.5  ALBUMIN 2.2* 1.8* 1.7*   No results for input(s): LIPASE, AMYLASE in the last 168 hours. No results for input(s): AMMONIA in the last 168 hours. Coagulation Profile:  Recent Labs Lab 08/14/2016 0408 07/28/16 0754 07/29/16 0518 07/30/16 0333 07/31/16 0554  INR 3.90 4.40* 3.93 4.40* 4.50*   Cardiac Enzymes: No results for input(s): CKTOTAL, CKMB, CKMBINDEX, TROPONINI in the last 168 hours. BNP (last 3 results) No results for input(s): PROBNP in the last 8760 hours. HbA1C: No results for input(s): HGBA1C in the last 72 hours. CBG: No results for input(s): GLUCAP in the last 168 hours. Lipid Profile: No results for input(s): CHOL, HDL, LDLCALC, TRIG, CHOLHDL, LDLDIRECT in the last 72 hours. Thyroid Function Tests: No results for input(s): TSH, T4TOTAL, FREET4, T3FREE, THYROIDAB in the last 72 hours. Anemia Panel: No results for input(s): VITAMINB12, FOLATE, FERRITIN, TIBC, IRON, RETICCTPCT in the last 72 hours. Sepsis Labs:  Recent Labs Lab 07/26/16 2019 08/17/2016 0417  LATICACIDVEN 1.84 1.40    Recent Results (from  the past 240 hour(s))  Culture, Urine     Status: Abnormal   Collection Time: 08/17/2016  2:58 AM  Result Value Ref Range Status   Specimen Description URINE, CATHETERIZED  Final   Special Requests NONE  Final   Culture >=100,000 COLONIES/mL ESCHERICHIA COLI (A)  Final   Report Status 07/30/2016 FINAL  Final   Organism ID, Bacteria ESCHERICHIA COLI (A)  Final      Susceptibility   Escherichia coli - MIC*    AMPICILLIN >=32 RESISTANT Resistant     CEFAZOLIN <=4 SENSITIVE Sensitive     CEFTRIAXONE <=1 SENSITIVE Sensitive     CIPROFLOXACIN >=4 RESISTANT Resistant     GENTAMICIN <=1 SENSITIVE Sensitive     IMIPENEM <=0.25 SENSITIVE Sensitive     NITROFURANTOIN <=16 SENSITIVE Sensitive     TRIMETH/SULFA <=20 SENSITIVE Sensitive     AMPICILLIN/SULBACTAM 8 SENSITIVE Sensitive  PIP/TAZO <=4 SENSITIVE Sensitive     Extended ESBL NEGATIVE Sensitive     * >=100,000 COLONIES/mL ESCHERICHIA COLI         Radiology Studies: Ct Chest W Contrast  Result Date: 07/29/2016 CLINICAL DATA:  Abnormal chest x-ray with opacification of left hemithorax. EXAM: CT CHEST WITH CONTRAST TECHNIQUE: Multidetector CT imaging of the chest was performed during intravenous contrast administration. CONTRAST:  77mL ISOVUE-300 IOPAMIDOL (ISOVUE-300) INJECTION 61% COMPARISON:  Chest radiograph yesterday.  Chest CT 06/17/2016 FINDINGS: Cardiovascular: Tortuous thoracic aorta with mild atherosclerosis. There are coronary artery calcifications. Heart normal in size, mild rightward mediastinal shift. Mediastinum/Nodes: Rightward mediastinal shift due to right pleural effusion. Left lower paratracheal adenopathy appears grossly similar, no motor confluent nodes measuring 2.6 x 0.8 cm. No additional mediastinal adenopathy. No hilar adenopathy. Thyroid nodule in the right lobe is subcentimeter and does not meet criteria for biopsy or need further evaluation. Probable intraluminal mucus/debris in the upper trachea image 24 series  7. Lungs/Pleura: Large left pleural effusion, mildly heterogeneous near completely filling the left hemithorax. There is complete atelectasis of the left lung. Small right pleural effusion with adjacent compressive atelectasis. No pulmonary edema. Upper Abdomen: There are scattered low-density lesions throughout the liver, ill-defined, largest measuring 18 mm in the subcapsular right lobe. Nodular hepatic contour suggests cirrhosis. Moderate abdominal ascites in the visualized upper abdomen. Musculoskeletal: Chronic deformities of left lateral ribs may be sequela of remote prior injury There are no acute or suspicious osseous abnormalities. IMPRESSION: 1. Large left pleural effusion causing complete atelectasis of the left lung and rightward mediastinal shift. 2. Small right pleural effusion and adjacent atelectasis. 3. Moderate upper abdominal ascites. Multiple low-density lesions in the liver nonspecific. There is question of nodular hepatic contours raising concern for cirrhosis. Recommend correlation with cirrhosis risk factors. Hepatic protocol MRI recommended for further characterization when patient is able tolerate breath hold technique. 4. Coronary artery calcifications. Mild thoracic aortic atherosclerosis per Aortic Atherosclerosis (ICD10-I70.0). Electronically Signed   By: Jeb Levering M.D.   On: 07/29/2016 22:18   Ir US Chest  Result Date: 07/30/2016 CLINICAL DATA:  Evaluate for thoracentesis.  INR is greater than 4. EXAM: CHEST ULTRASOUND COMPARISON:  None. FINDINGS: A moderate left pleural effusion is present. IMPRESSION: Moderate left pleural effusion. Electronically Signed   By: Marybelle Killings M.D.   On: 07/30/2016 14:19        Scheduled Meds: . amitriptyline  25 mg Oral QHS  . aspirin EC  81 mg Oral Daily  . atorvastatin  40 mg Oral QHS  . cloNIDine  0.2 mg Oral TID  . DULoxetine  20 mg Oral QHS  . feeding supplement  1 Container Oral TID BM  . furosemide  40 mg Intravenous  Daily  . gabapentin  400 mg Oral TID  . iopamidol      . metoprolol tartrate  100 mg Oral BID  . pantoprazole  40 mg Oral BID  . phenytoin  100 mg Oral TID   Continuous Infusions: . sodium chloride    . cefTRIAXone (ROCEPHIN)  IV Stopped (07/31/16 0531)     LOS: 4 days     Cordelia Poche, MD [facility] 07/31/2016, 11:29 AM Pager: [phone]  If 7PM-7AM, please contact night-coverage [email or web address] Password [facility] 07/31/2016, 11:29 AM

## 2016-08-01 ENCOUNTER — Inpatient Hospital Stay (HOSPITAL_COMMUNITY): Payer: Medicare HMO

## 2016-08-01 DIAGNOSIS — J910 Malignant pleural effusion: Secondary | ICD-10-CM

## 2016-08-01 DIAGNOSIS — C787 Secondary malignant neoplasm of liver and intrahepatic bile duct: Secondary | ICD-10-CM

## 2016-08-01 DIAGNOSIS — C259 Malignant neoplasm of pancreas, unspecified: Secondary | ICD-10-CM

## 2016-08-01 DIAGNOSIS — R180 Malignant ascites: Secondary | ICD-10-CM

## 2016-08-01 LAB — PROTIME-INR
INR: 3.8
INR: 2.47
INR: 2.44
PROTHROMBIN TIME: 38.4 s — AB (ref 11.4–15.2)
PROTHROMBIN TIME: 27.2 s — AB (ref 11.4–15.2)
PROTHROMBIN TIME: 26.9 s — AB (ref 11.4–15.2)

## 2016-08-01 LAB — CBC
HCT: 33.5 % — ABNORMAL LOW (ref 36.0–46.0)
Hemoglobin: 10.7 g/dL — ABNORMAL LOW (ref 12.0–15.0)
MCH: 29.9 pg (ref 26.0–34.0)
MCHC: 31.9 g/dL (ref 30.0–36.0)
MCV: 93.6 fL (ref 78.0–100.0)
PLATELETS: 363 10*3/uL (ref 150–400)
RBC: 3.58 MIL/uL — ABNORMAL LOW (ref 3.87–5.11)
RDW: 18.3 % — AB (ref 11.5–15.5)
WBC: 16 10*3/uL — AB (ref 4.0–10.5)

## 2016-08-01 LAB — BLOOD GAS, ARTERIAL
Acid-base deficit: 3.4 mmol/L — ABNORMAL HIGH (ref 0.0–2.0)
Bicarbonate: 21.8 mmol/L (ref 20.0–28.0)
Drawn by: 105521
O2 Content: 2 L/min
O2 Saturation: 94 %
PATIENT TEMPERATURE: 98.6
PCO2 ART: 44.2 mmHg (ref 32.0–48.0)
PO2 ART: 76.8 mmHg — AB (ref 83.0–108.0)
pH, Arterial: 7.314 — ABNORMAL LOW (ref 7.350–7.450)

## 2016-08-01 LAB — COMPREHENSIVE METABOLIC PANEL
ALT: 84 U/L — ABNORMAL HIGH (ref 14–54)
AST: 353 U/L — ABNORMAL HIGH (ref 15–41)
Albumin: 1.9 g/dL — ABNORMAL LOW (ref 3.5–5.0)
Alkaline Phosphatase: 172 U/L — ABNORMAL HIGH (ref 38–126)
Anion gap: 11 (ref 5–15)
BUN: 26 mg/dL — ABNORMAL HIGH (ref 6–20)
CHLORIDE: 106 mmol/L (ref 101–111)
CO2: 22 mmol/L (ref 22–32)
Calcium: 8.8 mg/dL — ABNORMAL LOW (ref 8.9–10.3)
Creatinine, Ser: 1.24 mg/dL — ABNORMAL HIGH (ref 0.44–1.00)
GFR calc Af Amer: 56 mL/min — ABNORMAL LOW (ref >60)
GFR, EST NON AFRICAN AMERICAN: 49 mL/min — AB (ref >60)
Glucose, Bld: 141 mg/dL — ABNORMAL HIGH (ref 65–99)
POTASSIUM: 4.6 mmol/L (ref 3.5–5.1)
SODIUM: 139 mmol/L (ref 135–145)
Total Bilirubin: 1.5 mg/dL — ABNORMAL HIGH (ref 0.3–1.2)
Total Protein: 8.7 g/dL — ABNORMAL HIGH (ref 6.5–8.1)

## 2016-08-01 MED ORDER — SODIUM CHLORIDE 0.9 % IV SOLN
Freq: Once | INTRAVENOUS | Status: AC
  Administered 2016-08-01: 10:00:00 via INTRAVENOUS

## 2016-08-01 MED ORDER — FUROSEMIDE 10 MG/ML IJ SOLN
INTRAMUSCULAR | Status: AC
  Filled 2016-08-01: qty 4

## 2016-08-01 MED ORDER — SODIUM CHLORIDE 0.9 % IV SOLN: Freq: Once | INTRAVENOUS | Status: AC

## 2016-08-01 MED ORDER — FUROSEMIDE 10 MG/ML IJ SOLN
40.0000 mg | Freq: Once | INTRAMUSCULAR | Status: AC
  Administered 2016-08-01: 40 mg via INTRAVENOUS

## 2016-08-01 MED ORDER — VITAMIN K1 10 MG/ML IJ SOLN
10.0000 mg | Freq: Once | INTRAVENOUS | Status: AC
  Administered 2016-08-01: 10 mg via INTRAVENOUS
  Filled 2016-08-01: qty 1

## 2016-08-01 MED ORDER — PANTOPRAZOLE SODIUM 40 MG IV SOLR
40.0000 mg | INTRAVENOUS | Status: DC
  Administered 2016-08-02: 40 mg via INTRAVENOUS
  Filled 2016-08-01: qty 40

## 2016-08-01 NOTE — Significant Event (Addendum)
Rapid Response Event Note  Overview:  Called by Rn for second set of eyes for respiratory distress Time Called: 1816 Arrival Time: 1820 Event Type: Respiratory  Initial Focused Assessment:  Called by RN for patient with respiratory distress.  On my arrival to patients room, family at bedside.  Patient is diaphoretic,  tachypneic  RR 28, 118/85, HR 102, sat 100% on 2 lpm nasal cannula Breath Sounds Rhonchi on R,  With crackles and expiratory wheeze, left absent breath sounds    Interventions:  PCXR, ABG ordered per protocol.  MD called by primary RN and updated.  Order for 40 mg lasix, RN to give now.  ABG 7.31, CO2  44, O2 76.   Plan of Care (if not transferred):  Patient to transfer to progressive care unit per MD.   Event Summary:   RN to call if assistance needed   at      at          [facility], [place]

## 2016-08-01 NOTE — Consult Note (Signed)
[facility] CONSULT NOTE  Patient Care Team: Charolette Forward, MD as PCP - General (Cardiology)  CHIEF COMPLAINTS/PURPOSE OF CONSULTATION:  Newly diagnosed pancreatic mass with liver metastases and large left pleural effusion with ascites  PRESENTING ILLNESS:  Ann Lynch 56 y.o. female is here because of recent diagnosis of DVT in June and was started on anticoagulation with Coumadin. For the past 2 weeks patient was experiencing increasing abdominal pain nausea and continuing fatigue and weakness. Because of this she was admitted to the hospital and workup with CT of the abdomen was performed. CT abdomen pelvis on 07/31/2016 revealed large left pleural effusion with complete atelectasis of the left lung, there is a large pancreatic mass measuring 7.7 x 5 x 7 cm which is contiguous with the posterior gastric wall and even left adrenal gland. There was occlusion of splenic vein as well. There was a poorly defined low-attenuation lesion within the liver suspicious for metastases.  Patient appears to be having a tough time breathing and is frail. She tells me that she has difficulty with eating anything from lack of appetite and abdominal distention. Her brother was at her bedside. She tells me that her husband passed away but she has 3 grown children.  reviewed her records extensively and collaborated the history with the patient.  MEDICAL HISTORY:  Past Medical History:  Diagnosis Date  . Anxiety   . Chronic pain   . Depression   . Depression with anxiety   . DVT (deep venous thrombosis) ([place]) dx'd 05/2016   BLE  . GERD (gastroesophageal reflux disease)   . High cholesterol   . Hypertension   . Protein calorie malnutrition ([place])   . Pyelonephritis 07/25/2016  . Stroke [facility]) 04/2007   "weak right side; some memory issues since" (07/28/2016)    SURGICAL HISTORY: Past Surgical History:  Procedure Laterality Date  . CESAREAN SECTION  1994  . DILATION AND  CURETTAGE OF UTERUS    . TONSILLECTOMY  1973  . TRACHEOSTOMY    . TUBAL LIGATION  1994    SOCIAL HISTORY: Social History   Social History  . Marital status: Widowed    Spouse name: N/A  . Number of children: N/A  . Years of education: N/A   Occupational History  . Not on file.   Social History Main Topics  . Smoking status: Former Smoker    Packs/day: 0.50    Years: 28.00    Types: Cigarettes    Quit date: 2009  . Smokeless tobacco: Never Used  . Alcohol use No     Comment: 07/26/2016 "drank some years ago"   . Drug use: No  . Sexual activity: No   Other Topics Concern  . Not on file   Social History Narrative  . No narrative on file    FAMILY HISTORY: Family History  Problem Relation Age of Onset  . Hypertension Mother   . Diabetes Mellitus II Mother   . Hypertension Father     ALLERGIES:  has No Known Allergies.  MEDICATIONS:  Current Facility-Administered Medications  Medication Dose Route Frequency Provider Last Rate Last Dose  . acetaminophen (TYLENOL) tablet 650 mg  650 mg Oral Q6H PRN Waldemar Dickens, MD   650 mg at 07/31/16 1345   Or  . acetaminophen (TYLENOL) suppository 650 mg  650 mg Rectal Q6H PRN Waldemar Dickens, MD      . albuterol (PROVENTIL) (2.5 MG/3ML) 0.083% nebulizer solution 5 mg  5 mg Nebulization  Q6H PRN Schorr, Rhetta Mura, NP   5 mg at 07/31/16 2342  . amitriptyline (ELAVIL) tablet 25 mg  25 mg Oral QHS Waldemar Dickens, MD   25 mg at 07/31/16 2325  . aspirin EC tablet 81 mg  81 mg Oral Daily Waldemar Dickens, MD   81 mg at 08/01/16 4098  . atorvastatin (LIPITOR) tablet 40 mg  40 mg Oral QHS Waldemar Dickens, MD   40 mg at 07/31/16 2324  . bisacodyl (DULCOLAX) suppository 10 mg  10 mg Rectal Daily PRN Mariel Aloe, MD      . cefTRIAXone (ROCEPHIN) 2 g in dextrose 5 % 50 mL IVPB  2 g Intravenous Q24H Mariel Aloe, MD   Stopped at 08/01/16 1052  . cloNIDine (CATAPRES) tablet 0.2 mg  0.2 mg Oral TID Waldemar Dickens, MD   0.2 mg  at 08/01/16 0956  . DULoxetine (CYMBALTA) DR capsule 20 mg  20 mg Oral QHS Waldemar Dickens, MD   20 mg at 07/31/16 2326  . feeding supplement (BOOST / RESOURCE BREEZE) liquid 1 Container  1 Container Oral TID BM Mariel Aloe, MD   1 Container at 08/01/16 0955  . furosemide (LASIX) injection 40 mg  40 mg Intravenous Daily Mariel Aloe, MD   40 mg at 08/01/16 0957  . gabapentin (NEURONTIN) capsule 400 mg  400 mg Oral TID Waldemar Dickens, MD   400 mg at 08/01/16 1191  . hydrALAZINE (APRESOLINE) injection 5 mg  5 mg Intravenous Q6H PRN Waldemar Dickens, MD      . metoprolol tartrate (LOPRESSOR) tablet 100 mg  100 mg Oral BID Mariel Aloe, MD   100 mg at 08/01/16 0956  . morphine 4 MG/ML injection 4 mg  4 mg Intravenous Q4H PRN Mariel Aloe, MD      . ondansetron [facility]) tablet 4 mg  4 mg Oral Q6H PRN Waldemar Dickens, MD   4 mg at 07/31/16 1350   Or  . ondansetron (ZOFRAN) injection 4 mg  4 mg Intravenous Q6H PRN Waldemar Dickens, MD      . oxyCODONE (Oxy IR/ROXICODONE) immediate release tablet 5 mg  5 mg Oral Q4H PRN Waldemar Dickens, MD   5 mg at 08/01/16 0959  . pantoprazole (PROTONIX) EC tablet 40 mg  40 mg Oral BID Waldemar Dickens, MD   40 mg at 08/01/16 0957  . phenytoin (DILANTIN) ER capsule 100 mg  100 mg Oral TID Waldemar Dickens, MD   100 mg at 08/01/16 0957  . promethazine (PHENERGAN) injection 12.5 mg  12.5 mg Intravenous Q6H PRN Waldemar Dickens, MD        REVIEW OF SYSTEMS:   Constitutional: Denies fevers, chills or abnormal night sweats Eyes: Denies blurriness of vision, double vision or watery eyes Ears, nose, mouth, throat, and face: Denies mucositis or sore throat Respiratory: Severe shortness of breath at rest Cardiovascular: Palpitations  Gastrointestinal:  abdominal distention from ascites, abdominal feels form. Skin: Denies abnormal skin rashes Neurological:Denies numbness, tingling or  severe generalized weakness Behavioral/Psych: Mood is stable, no new  changes  All other systems were reviewed with the patient and are negative.  PHYSICAL EXAMINATION: ECOG PERFORMANCE STATUS: 3 - Symptomatic, >50% confined to bed  Vitals:   08/01/16 1105 08/01/16 1138  BP: 109/80 116/85  Pulse: (!) 111 (!) 106  Resp: 18 16  Temp: 98.9 F (37.2 C) 98.7 F (37.1 C)  Filed Weights   07/26/16 1941  Weight: 150 lb (68 kg)    GENERAL:alert, no distress and comfortable SKIN: skin color, texture, turgor are normal, no rashes or significant lesions EYES: normal, conjunctiva are pink and non-injected, sclera clear OROPHARYNX:no exudate, no erythema and lips, buccal mucosa, and tongue normal  NECK: supple, thyroid normal size, non-tender, without nodularity LYMPH:  no palpable lymphadenopathy in the cervical, axillary or inguinal LUNGS: Clear, breath sounds on the left side, difficulty with clearing secretions HEART: regular rate & rhythm and no murmurs and no lower extremity edema ABDOMEN: Abdominal distention with a firm belly Musculoskeletal:no cyanosis of digits and no clubbing  PSYCH: alert & oriented x 3 with fluent speech NEURO: no focal motor/sensory deficits, generalized weakness   LABORATORY DATA:  I have reviewed the data as listed Lab Results  Component Value Date   WBC 16.0 (H) 08/01/2016   HGB 10.7 (L) 08/01/2016   HCT 33.5 (L) 08/01/2016   MCV 93.6 08/01/2016   PLT 363 08/01/2016   Lab Results  Component Value Date   NA 141 07/31/2016   K 4.1 07/31/2016   CL 110 07/31/2016   CO2 24 07/31/2016    RADIOGRAPHIC STUDIES: I have personally reviewed the radiological reports and agreed with the findings in the report.  ASSESSMENT AND PLAN:  1. Presumed diagnosis of metastatic pancreatic cancer with liver metastases, peritoneal metastases as well as lung metastases with presumably malignant left pleural effusion. - Patient will need thoracentesis to help her breathing - Biopsy will need to be performed to get a diagnosis family.  Possibly we can avoid liver biopsy if the thoracentesis is confirmatory. - I discussed with the patient extensively that metastatic pancreatic cancer is incurable. Chemotherapy is only treatment option for metastatic disease. - I provided her with literature on 5-FU, oxaliplatin and irinotecan chemotherapy regimen. I anticipate that she will have a difficult time given her poor performance status. However if she was interested in chemotherapy this is the best chemotherapy regimen that is currently available for metastatic pancreatic cancer. Alternatively other treatment regimens may also be considered for palliative intent.  2. CODE STATUS discussion: Patient's decision was to be a full cor at this time. I discussed with her that patients with metastatic disease like hers tend to not do very well if she were to have a CODE BLUE situation. She will think about it and discuss with her family and let us know if she changes her mind on this issue.  3. Patient will need a port placement if she were to consent to chemotherapy. I will meet with the patient tomorrow evening to decide if she wants to pursue chemotherapy. Palliative consultation could be obtained in the interim. I discussed with the family that if she were not to receive any treatment her life expectancy is likely to be less than a month. She will have a family discussion and I will meet with her tomorrow evening.   All questions were answered. The patient knows to call the clinic with any problems, questions or concerns.    Rulon Eisenmenger, MD @T @

## 2016-08-01 NOTE — Consult Note (Signed)
PULMONARY / CRITICAL CARE MEDICINE   Name: Ann Lynch MRN: [id number] DOB: January 28, 1960    ADMISSION DATE:  08/15/2016 CONSULTATION DATE: 08/01/16  REFERRING MD:  Dr Lonny Prude  CHIEF COMPLAINT: Respiratory distress  HISTORY OF PRESENT ILLNESS:   28yoF with Prior CVA (with residual left-sided hemiparesis, now chronically wheelchair bound), HTN, GERD, SZ, BLE DVT's (diagnosed in 05/2016, s/p IVC filter, on coumadin up until this admission), Depression, Anxiety, Chronic pain, and Anemia, now admitted 07/23/2016 with decreased PO intake, abdominal pain, weight loss, and recent UTI. She was found at time of admission to have AKI and was admitted to the hospital. Since then, CT Abdomen has diagnosed 7.7x5x7cm pancreatic tail mass with extension of the tumor into her stomach, into the lesser sac, and the medial spleen, with extensive peritoneal carcinomatosis, hepatic mets, and IVC filter with thrombus within it, as well as right ureteral obstruction with hydronephrosis, large left pleural effusion, and small right pleural effusion. Oncology had consulted on patient this hospitalization and was awaiting biopsy. Then tonight patient developed respiratory distress requiring transfer to ICU for BIPAP.  At time of my exam, patient is on low BIPAP settings with good Pox and no respiratory distress or accessory muscle use. She is alert but confused, only oriented to year "18" but not to place or condition. Patient's mother (POA) and sister are present as are several teenage family members. The mother and sister say they were not aware at all of this diagnosis and that no one had talked to them. I discussed advanced metastatic pancreatic cancer in detail with them including option for chemotherapy. Discussed that Oncology in their note doubted whether patient was strong enough to tolerate chemotherapy given her comorbidities and deconditioning. They agreed and said they wouldn't want chemotherapy. They said they  want her to be DNR. I discussed home hospice with them, and they seem agreeable. They said they would still like thoracentesis if possible for comfort purposes.   PAST MEDICAL HISTORY :  She  has a past medical history of Anxiety; Chronic pain; Depression; Depression with anxiety; DVT (deep venous thrombosis) ([place]) (dx'd 05/2016); GERD (gastroesophageal reflux disease); High cholesterol; Hypertension; Protein calorie malnutrition ([place]); Pyelonephritis (08/15/2016); and Stroke [facility]) (04/2007).  PAST SURGICAL HISTORY: She  has a past surgical history that includes Tracheostomy; Tonsillectomy (1973); Dilation and curettage of uterus; Cesarean section (1994); and Tubal ligation (1994).  No Known Allergies  No current facility-administered medications on file prior to encounter.    Current Outpatient Prescriptions on File Prior to Encounter  Medication Sig  . amitriptyline (ELAVIL) 25 MG tablet Take 1 tablet (25 mg total) by mouth at bedtime.  Marland Kitchen aspirin EC 81 MG tablet Take 81 mg by mouth daily.  Marland Kitchen atorvastatin (LIPITOR) 40 MG tablet Take 1 tablet (40 mg total) by mouth at bedtime.  . cloNIDine (CATAPRES) 0.2 MG tablet Take 1 tablet (0.2 mg total) by mouth 3 (three) times daily.  . DULoxetine (CYMBALTA) 20 MG capsule Take 1 capsule (20 mg total) by mouth at bedtime.  . feeding supplement, ENSURE ENLIVE, (ENSURE ENLIVE) LIQD Take 237 mLs by mouth 2 (two) times daily between meals.  . folic acid (FOLVITE) 1 MG tablet Take 1 tablet (1 mg total) by mouth daily.  Marland Kitchen gabapentin (NEURONTIN) 400 MG capsule Take 1 capsule (400 mg total) by mouth 3 (three) times daily.  . metoprolol tartrate (LOPRESSOR) 100 MG tablet Take 1 tablet (100 mg total) by mouth 2 (two) times daily.  . pantoprazole (PROTONIX) 40  MG tablet TAKE 1 TABLET BY MOUTH TWICE DAILY  . phenytoin (DILANTIN) 100 MG ER capsule Take 1 capsule (100 mg total) by mouth 3 (three) times daily.  . polyethylene glycol (MIRALAX / GLYCOLAX) packet Take 17  g by mouth daily. (Patient taking differently: Take 17 g by mouth daily as needed for mild constipation. )  . senna-docusate (SENOKOT-S) 8.6-50 MG tablet Take 1 tablet by mouth 2 (two) times daily. (Patient taking differently: Take 1 tablet by mouth 2 (two) times daily as needed for mild constipation. )  . warfarin (COUMADIN) 1 MG tablet Take 0.5 tablets (0.5 mg total) by mouth one time only at 6 PM.   FAMILY HISTORY:  Her indicated that the status of her mother is unknown. She indicated that the status of her father is unknown.   SOCIAL HISTORY: She  reports that she quit smoking about 9 years ago. Her smoking use included Cigarettes. She has a 14.00 pack-year smoking history. She has never used smokeless tobacco. She reports that she does not drink alcohol or use drugs.  REVIEW OF SYSTEMS:   Review of Systems  Unable to perform ROS: Mental status change   VITAL SIGNS: BP 118/85   Pulse (!) 102   Temp 98.1 F (36.7 C) (Oral)   Resp 20   Ht 5\' 7"  (1.702 m)   Wt 68 kg (150 lb)   SpO2 98%   BMI 23.49 kg/m   VENTILATOR SETTINGS: Vent Mode: BIPAP;PCV FiO2 (%):  [30 %] 30 % Set Rate:  [15 bmp] 15 bmp PEEP:  [8 cmH20] 8 cmH20  INTAKE / OUTPUT: I/O last 3 completed shifts: In: 1882 [P.O.:550; Blood:1332] Out: 800 [Urine:800]  PHYSICAL EXAMINATION: General: Middle aged female, lying in ICU bed on BIPAP, deconditioned and chronically ill appearing  Neuro: alert but only oriented to time "18". Is attempting to speak or ask questions but speech is slurred and difficult to understand. She is not oriented to place. She can squeeze left hand to command. Has right-sided hemiparesis.  HEENT: OP clear, Mucous membranes moist, PERRL, EOMI Cardiovascular: Mild tachycardia (HR 100) with regular rhythm Lungs: CTA on right, decreased breath sounds in left. No accessory muscle use while on BIPAP Abdomen: Distended, + fluid wave, non-tender to palpation, absent bowel sounds Musculoskeletal: 2+  RLE and Trace LLE edema Skin: no rashes   LABS:  BMET  Recent Labs Lab 07/29/16 0518 07/31/16 0554 08/01/16 1200  NA 139 141 139  K 3.2* 4.1 4.6  CL 105 110 106  CO2 25 24 22   BUN 30* 21* 26*  CREATININE 0.85 0.93 1.24*  GLUCOSE 114* 114* 141*    Electrolytes  Recent Labs Lab 08/15/2016 1829 07/28/16 0754 07/29/16 0518 07/31/16 0554 08/01/16 1200  CALCIUM  --  7.9* 8.1* 8.4* 8.8*  MG 1.4* 2.0  --   --   --   PHOS 2.4*  --   --   --   --    CBC  Recent Labs Lab 07/29/16 0518 07/31/16 0554 08/01/16 1200  WBC 13.0* 15.2* 16.0*  HGB 7.0* 6.9* 10.7*  HCT 22.8* 22.3* 33.5*  PLT 386 419* 363   Coag's  Recent Labs Lab 07/31/16 0554 08/01/16 1200 08/01/16 1904  INR 4.50* 3.80 2.47   Sepsis Markers  Recent Labs Lab 07/26/16 2019 07/30/2016 0417  LATICACIDVEN 1.84 1.40   ABG  Recent Labs Lab 08/01/16 1850  PHART 7.314*  PCO2ART 44.2  PO2ART 76.8*   Liver Enzymes  Recent Labs Lab  07/28/16 0754 07/31/16 0554 08/01/16 1200  AST 115* 213* 353*  ALT 32 56* 84*  ALKPHOS 112 132* 172*  BILITOT 1.0 0.7 1.5*  ALBUMIN 1.8* 1.7* 1.9*   Cardiac Enzymes No results for input(s): TROPONINI, PROBNP in the last 168 hours.  Glucose No results for input(s): GLUCAP in the last 168 hours.  Imaging Dg Chest Port 1 View  Result Date: 08/01/2016 CLINICAL DATA:  Shortness of Breath EXAM: PORTABLE CHEST 1 VIEW COMPARISON:  07/29/2016 FINDINGS: Continued opacification of left hemithorax is noted predominately related to pleural effusions similar to that seen on prior chest CT. Mild mediastinal shift to the right is noted. No other focal abnormality is seen. IMPRESSION: Opacification of left hemithorax stable from the prior exam consistent with large left pleural effusion. No new focal abnormality is noted. Electronically Signed   By: [name] M.D.   On: 08/01/2016 18:54   LINES/TUBES: 2-22G PIV's  ASSESSMENT / PLAN: 55yoF with Prior CVA (with residual  left-sided hemiparesis, now chronically wheelchair bound), HTN, GERD, SZ, BLE DVT's (diagnosed in 05/2016, s/p IVC filter, on coumadin up until this admission), Depression, Anxiety, Chronic pain, and Anemia, now admitted 08/03/2016 with decreased PO intake, abdominal pain, weight loss, diagnosed with UTI, AKI, and new diagnosis Pancreatic cancer with extension of the tumor into her stomach and spleen, with extensive peritoneal carcinomatosis, hepatic mets, and IVC filter with thrombus within it, as well as right ureteral obstruction with hydronephrosis, large left pleural effusion, and small right pleural effusion. Now transferred to ICU with respiratory distress due to the pleural effusion, requiring BIPAP.    PULMONARY 1. Respiratory distress; Pleural effusions: - bilateral pleural effusions (L>>R) likely malignant in nature, now causing respiratory distress requiring BIPAP - plan for thoracentesis once INR is <2 - appears comfortable on BIPAP; hopefully can transition off BIPAP after thoracentesis  CARDIOVASCULAR 1. History HTN: - currently soft BP; hold home antihypertensives  RENAL 1. AKI; Hydronephrosis: - Creatinine 2.19 on admission, up from baseline 1.08; Improved to 0.93 but now worsened again to 1.24, likely due to being intravascularly volume depleted  - Getting volume in form of FFP tonight; recheck BMP in AM - continue Foley catheter - Hydronephrosis presumably caused by tumor invasion of the ureter  GASTROINTESTINAL 1. GERD: - continue GI prophylaxis  2. Transaminitis: - due to the liver mets; continue to follow.  HEMATOLOGIC 1. Anemia; BLE DVT's; Supratherapeutic INR: - was on coumadin prior to this admission, now held.  - Hgb in 7-8's since May 2018, dropped as low at 6.9 this admission, now up to 10.7 - anemia likely a combination of anemia of chronic disease but also some concern that she may be bleeding into her abdomen and chest due to the supratherapeutic INR and also  due to the tumor's direct growth into the spleen and stomach.  - s/p 2u FFP today; INR improved from 4.5 to 2.47. Will give another 2u FFP now as well as Vit K 10 IV. Recheck INR after the FFP and if <2.0 will plan for thoracentesis.  - will recheck Hgb later tonight to ensure it hasnt dropped given the concern for possible bleeding.  - regarding her DVT's, they are likely developing as the tumor is causing her to be coagulopathic. She has an IVC filter in place but is unable to be on anticoagulation given the concern for bleeding.   2. Metastatic Pancreatic cancer with direct growth into spleen and stomach and Mets to Liver; Pleural effusion (presumed Malignant).  -  discussed diagnosis with patient's mother (who is her medical POA) and with her sister. They wish to make patient DNR. They do not want to pursue chemotherapy as they dont think she would tolerate it. I agree with them. They are interested in home hospice.  - placed DNR orders - consult Palliative care in AM to set up home hospice  INFECTIOUS 1. Ecoli UTI: - continue Ceftriaxone (started 7/10, now day 6)  ENDOCRINE 1. Elevated Beta-HCG:  - this is related to the tumor itself. Patient is NOT pregnant.   NEUROLOGIC 1. Acute Encephalopathy; History CVA; History SZ: - patient's family report this acute encephalopathy is not her baseline and has only started since this admission. Possibly related to the elevated ammonia level which is likely related to her liver mets. Very doubtful this is related to her UTI as she is already several days into treatment with no documented fevers and lactate not elevated. Not related to hypercapnea as ABG showed only very minimal hypercapnea (pCO2 44).  - primary team to consider Head CT in AM, although not sure if indicated now that she is DNR with plan to transition to home hospice - she is obeying commands and therefore is not seizing; continue Dilantin.    FAMILY  - Updates: family updated at  bedside.    60 minutes critical care time  Vernie Murders, MD [facility] [place] Pager: [phone]  08/01/2016, 8:09 PM

## 2016-08-01 NOTE — Progress Notes (Signed)
Received page from nurse regarding patient. Increased respiratory distress likely from known left sided pleural effusion from likely hemothorax from presumed metastatic pancreatic adenocarcinoma. Patient with respirations in the 20s and labored breathing, alert and responsive. CXR personally reviewed and significant for stable left pleural effusion with some increased mediastinal shift to the right. ABG significant for mildly decreased pH and hypoxia. Will obtain stat INR s/p FFP. Bipap. Consult CCM for possible thoracentesis. Dr. Phylliss Bob to see patient on the floor. Will order two more units FFP. Transfer to ICU.  Ann Poche, MD [facility] 08/01/2016, 7:21 PM Pager: [phone]

## 2016-08-01 NOTE — Progress Notes (Signed)
PROGRESS NOTE    Ann Lynch  NTZ:[id number] DOB: [birth year] DOA: 08/14/2016 PCP: Charolette Forward, MD   Brief Narrative: Ann Lynch is a 56 y.o. 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 She presented with nausea and vomiting and found to have a urinary tract infection.   Assessment & Plan:  Active Problems:   AKI (acute kidney injury) ([place])   Seizures (HCC)   Supratherapeutic INR   Pyelonephritis   Intractable nausea and vomiting   Chronic deep vein thrombosis (DVT) (HCC)   Positive pregnancy test   Hypokalemia   Severe protein-calorie malnutrition [name]: less than 60% of standard weight) (HCC)   History of CVA (cerebrovascular accident)   UTI Intractable nausea/vomiting Vomiting improved. Urine culture significant for E. Coli sensitive to ceftriaxone -Continue ceftriaxone  Pelvic fluid Elevated B-hcg Discussed with Dr. Arlina Robes of OB/Gyn faculty practice who recommends repeat b-hcg in 48 hours. Repeat slightly downtrending. Recommending outpatient follow-up although this may be a non-issue in the setting of patient's overall picture  Metastatic disease Likely pancreatic in origin. This explains liver disease, scites, loss of appetite and abdominal pain. -lasix -consult oncology -consult IR for biopsy  Anemia Chronic. Patient is perimenopausal. Hemoglobin dropped down to 6.9. Patient is asymptomatic. INR is supratherapeutic with no evidence of bleeding on exam or history. 2u PRBC -CBC pending  History of proximal DVT involving iliac, common femoral, femoral and popliteal veins bilaterally Supratherapeutic INR Patient on coumadin. S/p IVC filter. INR supratherapeutic secondary to liver disease. -warfarin per pharmacy  -reverse INR with FFP in preparation for biopsy -elevate legs  History of CVA Patient with residual right-sided deficits -continue  aspirin  Depression Anxiety -continue Cymbalta -continue amitriptyline  Seizure disorder -continue dilantin ER  Severe protein calorie malnutrition -Dietitian consult  Essential hypertension -metoprolol  Acute kidney injury Improved with IV fluids   DVT prophylaxis: SCDs Code Status: Full code Family Communication: Mother and sister at bedside Disposition Plan: Discharge pending medical stability   Consultants:   Gynecology  Gastroenterology  Procedures:   None  Antimicrobials:  Ceftriaxone    Subjective: Abdominal pain managed with medications  Objective: Vitals:   07/31/16 2100 08/01/16 0400 08/01/16 0421 08/01/16 0706  BP: 120/77 116/79 (!) 113/92 123/83  Pulse: 100 94 94 100  Resp: 20 18 18 18   Temp: 98.4 F (36.9 C) 98.4 F (36.9 C) 98.3 F (36.8 C) 98.4 F (36.9 C)  TempSrc: Oral Oral Oral Oral  SpO2: 98% 96% 95%   Weight:      Height:        Intake/Output Summary (Last 24 hours) at 08/01/16 0941 Last data filed at 08/01/16 0820  Gross per 24 hour  Intake             1206 ml  Output              600 ml  Net              606 ml   Filed Weights   07/26/16 1941  Weight: 68 kg (150 lb)    Examination:  General exam: Appears calm and comfortable Respiratory system: unlabored Central nervous system: alert and oriented. Not baseline per family Psychiatry: Judgement and insight appear normal. Mood & affect depressed and flat.     Data Reviewed: I have personally reviewed following labs and imaging studies  CBC:  Recent Labs Lab 07/26/16 1950 07/28/16 0754 07/29/16 0518 07/31/16 0554  WBC  16.3* 12.2* 13.0* 15.2*  NEUTROABS 13.3*  --   --   --   HGB 7.8* 7.0* 7.0* 6.9*  HCT 24.1* 22.3* 22.8* 22.3*  MCV 92.0 95.3 94.2 94.9  PLT 454* 483* 386 267*   Basic Metabolic Panel:  Recent Labs Lab 07/26/16 1950 08/01/2016 1829 07/28/16 0754 07/29/16 0518 07/31/16 0554  NA 132*  --  138 139 141  K 3.1*  --  3.4* 3.2* 4.1  CL  91*  --  104 105 110  CO2 26  --  22 25 24   GLUCOSE 129*  --  106* 114* 114*  BUN 63*  --  42* 30* 21*  CREATININE 2.19*  --  1.07* 0.85 0.93  CALCIUM 8.5*  --  7.9* 8.1* 8.4*  MG  --  1.4* 2.0  --   --   PHOS  --  2.4*  --   --   --    GFR: Estimated Creatinine Clearance: 68 mL/min (by C-G formula based on SCr of 0.93 mg/dL). Liver Function Tests:  Recent Labs Lab 07/26/16 1950 07/28/16 0754 07/31/16 0554  AST 89* 115* 213*  ALT 27 32 56*  ALKPHOS 140* 112 132*  BILITOT 1.3* 1.0 0.7  PROT 8.6* 7.8 7.5  ALBUMIN 2.2* 1.8* 1.7*   No results for input(s): LIPASE, AMYLASE in the last 168 hours.  Recent Labs Lab 07/31/16 1143  AMMONIA 40*   Coagulation Profile:  Recent Labs Lab 08/04/2016 0408 07/28/16 0754 07/29/16 0518 07/30/16 0333 07/31/16 0554  INR 3.90 4.40* 3.93 4.40* 4.50*   Cardiac Enzymes: No results for input(s): CKTOTAL, CKMB, CKMBINDEX, TROPONINI in the last 168 hours. BNP (last 3 results) No results for input(s): PROBNP in the last 8760 hours. HbA1C: No results for input(s): HGBA1C in the last 72 hours. CBG: No results for input(s): GLUCAP in the last 168 hours. Lipid Profile: No results for input(s): CHOL, HDL, LDLCALC, TRIG, CHOLHDL, LDLDIRECT in the last 72 hours. Thyroid Function Tests: No results for input(s): TSH, T4TOTAL, FREET4, T3FREE, THYROIDAB in the last 72 hours. Anemia Panel: No results for input(s): VITAMINB12, FOLATE, FERRITIN, TIBC, IRON, RETICCTPCT in the last 72 hours. Sepsis Labs:  Recent Labs Lab 07/26/16 2019 08/15/2016 0417  LATICACIDVEN 1.84 1.40    Recent Results (from the past 240 hour(s))  Culture, Urine     Status: Abnormal   Collection Time: 08/14/2016  2:58 AM  Result Value Ref Range Status   Specimen Description URINE, CATHETERIZED  Final   Special Requests NONE  Final   Culture >=100,000 COLONIES/mL ESCHERICHIA COLI (A)  Final   Report Status 07/30/2016 FINAL  Final   Organism ID, Bacteria ESCHERICHIA COLI  (A)  Final      Susceptibility   Escherichia coli - MIC*    AMPICILLIN >=32 RESISTANT Resistant     CEFAZOLIN <=4 SENSITIVE Sensitive     CEFTRIAXONE <=1 SENSITIVE Sensitive     CIPROFLOXACIN >=4 RESISTANT Resistant     GENTAMICIN <=1 SENSITIVE Sensitive     IMIPENEM <=0.25 SENSITIVE Sensitive     NITROFURANTOIN <=16 SENSITIVE Sensitive     TRIMETH/SULFA <=20 SENSITIVE Sensitive     AMPICILLIN/SULBACTAM 8 SENSITIVE Sensitive     PIP/TAZO <=4 SENSITIVE Sensitive     Extended ESBL NEGATIVE Sensitive     * >=100,000 COLONIES/mL ESCHERICHIA COLI         Radiology Studies: Ct Abdomen Pelvis W Contrast  Result Date: 07/31/2016 CLINICAL DATA:  Nausea and vomiting, urinary tract infection, suprapubic pain,  abdominal tenderness and guarding, absent bowel sounds, history hypertension, stroke, chronic DVT, former smoker EXAM: CT ABDOMEN AND PELVIS WITH CONTRAST TECHNIQUE: Multidetector CT imaging of the abdomen and pelvis was performed using the standard protocol following bolus administration of intravenous contrast. CONTRAST:  157mL ISOVUE-300 IOPAMIDOL (ISOVUE-300) INJECTION 61% IV. Dilute oral contrast. COMPARISON:  CT pelvis 06/12/2016, CT chest 07/29/2016 FINDINGS: Lower chest: Large LEFT pleural effusion with complete atelectasis of lingula and LEFT lower lobe mediastinal shift LEFT-to-RIGHT. Small RIGHT pleural effusion with compressive atelectasis of RIGHT lower lobe. Hepatobiliary: Poorly defined low-attenuation foci within the liver suspicious for metastases measuring up to 19 x 17 mm lateral RIGHT lobe image 26. Gallbladder unremarkable. Background of fatty infiltration of liver. Pancreas: Pancreatic head and body normal appearance. Large poorly defined heterogeneous predominant low-attenuation mass within the pancreatic tail compatible with a pancreatic neoplasm measuring 7.7 x 5.0 x 7.0 cm. Mass is contiguous with the posterior gastric wall, medial border of spleen, and questionably LEFT  adrenal gland. Conclusion of splenic vein. Spleen: Central focal area of low-attenuation 12 mm diameter cannot exclude metastasis Adrenals/Urinary Tract: Adrenal glands unremarkable. Tiny nonobstructing LEFT renal calculus. RIGHT hydronephrosis and hydroureter likely due to ureteral obstruction by tumor in pelvis. Stomach/Bowel: No evidence of bowel obstruction. Displaced bowel loops by ascites. Multiple nodules within the small bowel mesenteric question interloop peritoneal metastases versus adenopathy. Vascular/Lymphatic: Splenic vein occlusion. IVC filter with question thrombus in IVC/filter atherosclerotic calcifications aorta and iliac arteries. Aorta normal caliber. Minimal coronary artery calcification LEFT main. Reproductive: Uterus unremarkable. Ovaries not discretely visualized. Other: Extensive ascites. Omental caking. Enhancing soft tissue nodules at anterior peritoneal surface at umbilicus. Tumor nodularity in lesser sac extending to adjacent to the duodenum. Tumor nodularity along peritoneal surfaces of the pelvis. Tumor mass in RIGHT anterior pararenal space 5.3 x 3.0 cm image 52. Musculoskeletal: No acute osseous findings. IMPRESSION: Large pancreatic tail mass 7.7 x 5.0 x 7.0 cm compatible with a primary pancreatic neoplasm. Local extension of tumor to the posterior wall of the stomach, into lesser sac, and to medial spleen. 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 Hepatic metastases. Single enlarged retroperitoneal para-aortic node. IVC filter with suspect thrombus within the filter. RIGHT ureteral obstruction with hydronephrosis. Large LEFT pleural effusion and significant LEFT lung atelectasis. Small RIGHT pleural effusion and mild RIGHT basilar atelectasis. Aortic Atherosclerosis (ICD10-I70.0). Findings called to Dr. Lonny Prude on 07/31/2016 at 1755 hours. Electronically Signed   By: Lavonia Dana M.D.   On: 07/31/2016 17:58   Ir US Chest  Result Date: 07/30/2016 CLINICAL DATA:   Evaluate for thoracentesis.  INR is greater than 4. EXAM: CHEST ULTRASOUND COMPARISON:  None. FINDINGS: A moderate left pleural effusion is present. IMPRESSION: Moderate left pleural effusion. Electronically Signed   By: Marybelle Killings M.D.   On: 07/30/2016 14:19        Scheduled Meds: . amitriptyline  25 mg Oral QHS  . aspirin EC  81 mg Oral Daily  . atorvastatin  40 mg Oral QHS  . cloNIDine  0.2 mg Oral TID  . DULoxetine  20 mg Oral QHS  . feeding supplement  1 Container Oral TID BM  . furosemide  40 mg Intravenous Daily  . gabapentin  400 mg Oral TID  . metoprolol tartrate  100 mg Oral BID  . pantoprazole  40 mg Oral BID  . phenytoin  100 mg Oral TID   Continuous Infusions: . sodium chloride    . cefTRIAXone (ROCEPHIN)  IV Stopped (07/31/16 0531)  LOS: 5 days     Cordelia Poche, MD [facility] 08/01/2016, 9:41 AM Pager: [phone]  If 7PM-7AM, please contact night-coverage [email or web address] Password [facility] 08/01/2016, 9:41 AM

## 2016-08-01 NOTE — Progress Notes (Addendum)
CT results noted recommend IR to determine easiest possible biopsy probably liver and reverse INR in meantime with FFP and will await above and probably recommend drawing tumor markers and please let us know if oncology would like an endoscopy at some point but will hold off for now and please call us if we could be of any further assistance. And patient seen and discussed with family and primary team

## 2016-08-01 NOTE — Progress Notes (Signed)
  Request for biopsy to evaluate for metastatic disease.  Images reviewed by Dr. Annamaria Boots.  He recommends paracentesis first.  Need INR below 2.5 for para.  Thoracentesis also ordered. Will plan for thora on Tuesday.  Calysta Craigo S Aylissa Heinemann PA-C 08/01/2016 1:03 PM

## 2016-08-02 ENCOUNTER — Inpatient Hospital Stay (HOSPITAL_COMMUNITY): Payer: Medicare HMO

## 2016-08-02 DIAGNOSIS — J9621 Acute and chronic respiratory failure with hypoxia: Secondary | ICD-10-CM

## 2016-08-02 DIAGNOSIS — Z515 Encounter for palliative care: Secondary | ICD-10-CM

## 2016-08-02 DIAGNOSIS — Z7189 Other specified counseling: Secondary | ICD-10-CM

## 2016-08-02 DIAGNOSIS — N179 Acute kidney failure, unspecified: Principal | ICD-10-CM

## 2016-08-02 DIAGNOSIS — J90 Pleural effusion, not elsewhere classified: Secondary | ICD-10-CM

## 2016-08-02 DIAGNOSIS — I8291 Chronic embolism and thrombosis of unspecified vein: Secondary | ICD-10-CM

## 2016-08-02 DIAGNOSIS — J942 Hemothorax: Secondary | ICD-10-CM

## 2016-08-02 DIAGNOSIS — R0602 Shortness of breath: Secondary | ICD-10-CM

## 2016-08-02 DIAGNOSIS — C799 Secondary malignant neoplasm of unspecified site: Secondary | ICD-10-CM

## 2016-08-02 LAB — BODY FLUID CELL COUNT WITH DIFFERENTIAL
LYMPHS FL: 47 %
Monocyte-Macrophage-Serous Fluid: 7 % — ABNORMAL LOW (ref 50–90)
NEUTROPHIL FLUID: 46 % — AB (ref 0–25)
WBC FLUID: UNDETERMINED uL (ref 0–1000)

## 2016-08-02 LAB — BPAM PLATELET PHERESIS
BLOOD PRODUCT EXPIRATION DATE: 201807161117
BLOOD PRODUCT EXPIRATION DATE: 201807162359
ISSUE DATE / TIME: 201807151111
ISSUE DATE / TIME: 201807151338
UNIT TYPE AND RH: 5100
UNIT TYPE AND RH: 7300

## 2016-08-02 LAB — BASIC METABOLIC PANEL
Anion gap: 11 (ref 5–15)
BUN: 30 mg/dL — AB (ref 6–20)
CHLORIDE: 104 mmol/L (ref 101–111)
CO2: 24 mmol/L (ref 22–32)
CREATININE: 1.42 mg/dL — AB (ref 0.44–1.00)
Calcium: 8.9 mg/dL (ref 8.9–10.3)
GFR calc Af Amer: 48 mL/min — ABNORMAL LOW (ref >60)
GFR calc non Af Amer: 41 mL/min — ABNORMAL LOW (ref >60)
GLUCOSE: 112 mg/dL — AB (ref 65–99)
POTASSIUM: 4.3 mmol/L (ref 3.5–5.1)
Sodium: 139 mmol/L (ref 135–145)

## 2016-08-02 LAB — CBC
HCT: 27.5 % — ABNORMAL LOW (ref 36.0–46.0)
HCT: 31.8 % — ABNORMAL LOW (ref 36.0–46.0)
HEMOGLOBIN: 8.8 g/dL — AB (ref 12.0–15.0)
HEMOGLOBIN: 10.2 g/dL — AB (ref 12.0–15.0)
MCH: 30.3 pg (ref 26.0–34.0)
MCH: 30.5 pg (ref 26.0–34.0)
MCHC: 32 g/dL (ref 30.0–36.0)
MCHC: 32.1 g/dL (ref 30.0–36.0)
MCV: 94.8 fL (ref 78.0–100.0)
MCV: 95.2 fL (ref 78.0–100.0)
Platelets: 288 10*3/uL (ref 150–400)
Platelets: 262 10*3/uL (ref 150–400)
RBC: 2.9 MIL/uL — AB (ref 3.87–5.11)
RBC: 3.34 MIL/uL — AB (ref 3.87–5.11)
RDW: 18.6 % — ABNORMAL HIGH (ref 11.5–15.5)
RDW: 18.8 % — ABNORMAL HIGH (ref 11.5–15.5)
WBC: 14.2 10*3/uL — AB (ref 4.0–10.5)
WBC: 14.5 10*3/uL — ABNORMAL HIGH (ref 4.0–10.5)

## 2016-08-02 LAB — POCT I-STAT 3, VENOUS BLOOD GAS (G3P V)
ACID-BASE EXCESS: 1 mmol/L (ref 0.0–2.0)
Bicarbonate: 26.9 mmol/L (ref 20.0–28.0)
O2 Saturation: 58 %
PH VEN: 7.293 (ref 7.250–7.430)
TCO2: 29 mmol/L (ref 0–100)
pCO2, Ven: 55.5 mmHg (ref 44.0–60.0)
pO2, Ven: 34 mmHg (ref 32.0–45.0)

## 2016-08-02 LAB — BPAM FFP
BLOOD PRODUCT EXPIRATION DATE: 201807172359
Blood Product Expiration Date: 201807172359
ISSUE DATE / TIME: 201807152147
ISSUE DATE / TIME: 201807152147
UNIT TYPE AND RH: 6200
Unit Type and Rh: 6200

## 2016-08-02 LAB — TYPE AND SCREEN
ABO/RH(D): O POS
ANTIBODY SCREEN: NEGATIVE
Unit division: 0
Unit division: 0

## 2016-08-02 LAB — PREPARE FRESH FROZEN PLASMA
UNIT DIVISION: 0
Unit division: 0

## 2016-08-02 LAB — PREPARE PLATELET PHERESIS
UNIT DIVISION: 0
Unit division: 0

## 2016-08-02 LAB — BPAM RBC
BLOOD PRODUCT EXPIRATION DATE: 201808052359
BLOOD PRODUCT EXPIRATION DATE: 201808052359
ISSUE DATE / TIME: 201807150347
ISSUE DATE / TIME: 201807141205
UNIT TYPE AND RH: 5100
Unit Type and Rh: 5100

## 2016-08-02 LAB — GLUCOSE, CAPILLARY: Glucose-Capillary: 97 mg/dL (ref 65–99)

## 2016-08-02 LAB — PROTIME-INR
INR: 1.67
INR: 1.4
PROTHROMBIN TIME: 17.3 s — AB (ref 11.4–15.2)
Prothrombin Time: 19.9 seconds — ABNORMAL HIGH (ref 11.4–15.2)

## 2016-08-02 LAB — GLUCOSE, PLEURAL OR PERITONEAL FLUID: Glucose, Fluid: 114 mg/dL

## 2016-08-02 LAB — GRAM STAIN

## 2016-08-02 LAB — PROTEIN, PLEURAL OR PERITONEAL FLUID: TOTAL PROTEIN, FLUID: 5.7 g/dL

## 2016-08-02 LAB — LACTATE DEHYDROGENASE, PLEURAL OR PERITONEAL FLUID: LD FL: 400 U/L — AB (ref 3–23)

## 2016-08-02 LAB — PROTEIN, TOTAL: TOTAL PROTEIN: 8.4 g/dL — AB (ref 6.5–8.1)

## 2016-08-02 LAB — LACTATE DEHYDROGENASE: LDH: 424 U/L — AB (ref 98–192)

## 2016-08-02 LAB — MRSA PCR SCREENING: MRSA by PCR: INVALID — AB

## 2016-08-02 MED ORDER — LORAZEPAM 2 MG/ML IJ SOLN
0.5000 mg | Freq: Four times a day (QID) | INTRAMUSCULAR | Status: DC | PRN
  Administered 2016-08-02 – 2016-08-03 (×2): 0.5 mg via INTRAVENOUS
  Filled 2016-08-02 (×2): qty 1

## 2016-08-02 MED ORDER — METOPROLOL TARTRATE 5 MG/5ML IV SOLN
5.0000 mg | Freq: Once | INTRAVENOUS | Status: AC
  Administered 2016-08-02: 5 mg via INTRAVENOUS
  Filled 2016-08-02: qty 5

## 2016-08-02 MED ORDER — PANTOPRAZOLE SODIUM 40 MG PO TBEC
40.0000 mg | DELAYED_RELEASE_TABLET | Freq: Every day | ORAL | Status: DC
  Administered 2016-08-02 – 2016-08-03 (×2): 40 mg via ORAL
  Filled 2016-08-02 (×2): qty 1

## 2016-08-02 MED ORDER — SODIUM CHLORIDE 0.9 % IV BOLUS (SEPSIS)
500.0000 mL | Freq: Once | INTRAVENOUS | Status: AC
  Administered 2016-08-02: 500 mL via INTRAVENOUS

## 2016-08-02 MED ORDER — METOPROLOL TARTRATE 50 MG PO TABS
50.0000 mg | ORAL_TABLET | Freq: Once | ORAL | Status: AC
  Administered 2016-08-02: 50 mg via ORAL
  Filled 2016-08-02: qty 1

## 2016-08-02 NOTE — Progress Notes (Signed)
Progress Note  I was able to speak with Mrs. Alcario Drought, the patient's mother and HCPOA. She plans to be at the hospital later today, though was uncertain on the time. She has my contact information and plans to call me once her arrival time is clarified. She did confirm that she wants to meet and talk about supportive care for her daughter at home, which will likely include Hospice. Initial consult note to follow after our meeting.   Charlynn Court AGNP-C Palliative Care [phone]  No charge note.

## 2016-08-02 NOTE — Progress Notes (Signed)
HEM-ONC  I reviewed the patient's chart and went through all the notes and records. I agree completely with Dr. Lonny Prude I discussed with the patient's mom as well.  DO NOT RESUSCITATE  Hospice care Extremely poor prognosis  Patient's brother is apparently not on board but her mother was very clear about her wishes that her daughter should not be put through any further tests or biopsies or blood draws.

## 2016-08-02 NOTE — Consult Note (Signed)
Consultation Note Date: 08/02/2016   Patient Name: Ann Lynch  DOB: Jun 20, 1960  MRN: [id number]  Age / Sex: 56 y.o., female  PCP: Ann Forward, MD Referring Physician: Mariel Aloe, MD  Reason for Consultation: Disposition, Establishing goals of care and Psychosocial/spiritual support  HPI/Patient Profile: 56 y.o. 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 She presented to the ED with decreased PO intake, abdominal pain, and weight loss. In the ED she was noted to have AKI. She was admitted on 08/17/2016. 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 There was also extensive peritoneal carcinomatosis, hepatic mets, and IVC filter with thrombus within it, as well as right ureteral obstruction with hydronephrosis, large left pleural effusion, and small right pleural effusion. Oncology consulted and clarified this was an incurable malignancy, with the only treatment option of chemotherapy. Unfortunately, she is a poor candidate for treatment given her poor performance status. Critical Care then met with pt and family (mother and sister) and explained the diagnosis and prognosis; they elected for DNR, no chemotherapy, and wanted more information on support option for her at home, to possibly include Hospice. Palliative consulted to assist pt and family in goals of care and disposition.  Clinical Assessment and Goals of Care: The patient is on BiPAP and her ability to communicate is limited by the machine and respiratory fatigue. Her mother asked that we speak outside of the room, the patient's sister was also present.   We started our conversation with a life review. They talked about the patient's independence, relative  health, and drive to do more even after her stroke. They both felt she was "the strong one who will outlive everyone." About two months ago they noticed a big change. Since her DVT diagnosis they found her once incredible appetite now minimal. She was becoming weaker. Her drive to "get up and go" had diminished.   In this admission they have a very clear understanding of her medical issues. They understand the widely metastatic cancer that is incurable. They agree that she is too weak for treatment. They realize multiple organs have been effected by the cancer and are shutting down. In talking through next steps, the patient's mother is clear that comfort is the priority. Her biggest hope is to prevent suffering and allowing her daughter to die with peace and dignity.   In order to meet that goal of comfort and dignity, we discussed different routes Lynch. Ann Lynch has become reliant on the BiPAP for adequate O2 support. She also has a rapidly re-accumulating pleural effusion and progressive kidney and liver failure. While there may be the possibility of getting her home, I believe her time is very limited and she has a high risk for significant symptom burden. I discussed my concerns with the family. At this point, given her likely hours to days timeframe, they want her to remain here. We discussed transitioning to comfort focused measures, to include transitioning off of BiPAP, stopping labs, and no further procedures. They are in strong support of stopping labs and procedures, but would like to continue BiPAP until more family arrives tomorrow.   Primary Decision Maker The patient is awake and alert, however she defers to her mother for decisions (as does the rest of the family)  SUMMARY OF RECOMMENDATIONS    DNR, continue BiPAP for now. She is on comfort focused care at this point with no further labs or procedures. Plan to leave BiPAP on for now until more family can arrive tomorrow, then  will transition her off BiPAP and onto a facemask-->[place].   As she transitions off BiPAP I suspect she will not be stable for transitioning out of the hospital. I anticipate a hospital death. The family does not want her moved from the hospital given her prognosis of hours to days.  Code Status/Advance Care Planning:  DNR  Symptom Management:   Pain/dyspnea: PRN Oxycodone and IV morphine  Palliative Prophylaxis:   Aspiration, Frequent Pain Assessment and Oral Care  Additional Recommendations (Limitations, Scope, Preferences):  Moving towards comfort care only. No further labs or procedures. Will transition off BiPAP tomorrow.   Psycho-social/Spiritual:   Desire for further Chaplaincy support:yes  Prognosis:   Hours - Days  Discharge Planning: Anticipated Hospital Death      Primary Diagnoses: Present on Admission: . Pyelonephritis . AKI (acute kidney injury) ([place]) . Supratherapeutic INR   I have reviewed the medical record, interviewed the patient and family, and examined the patient. The following aspects are pertinent.  Past Medical History:  Diagnosis Date  . Anxiety   . Chronic pain   . Depression   . Depression with anxiety   . DVT (deep venous thrombosis) ([place]) dx'd 05/2016   BLE  . GERD (gastroesophageal reflux disease)   . High cholesterol   . Hypertension   . Protein calorie malnutrition ([place])   . Pyelonephritis 07/23/2016  . Stroke [facility]) 04/2007   "weak right side; some memory issues since" (08/04/2016)   Social History   Social History  . Marital status: Widowed    Spouse name: N/A  . Number of children: N/A  . Years of education: N/A   Social History Main Topics  . Smoking status: Former Smoker    Packs/day: 0.50    Years: 28.00    Types: Cigarettes    Quit date: 2009  . Smokeless tobacco: Never Used  . Alcohol use No     Comment: 08/01/2016 "drank some years ago"   . Drug use: No  . Sexual activity: No   Other Topics Concern  .  None   Social History Narrative  . None   Family History  Problem Relation Age of Onset  . Hypertension Mother   . Diabetes Mellitus II Mother   . Hypertension Father    Scheduled Meds: . amitriptyline  25 mg Oral QHS  . atorvastatin  40 mg Oral QHS  . DULoxetine  20 mg Oral QHS  . feeding supplement  1 Container Oral TID BM  . gabapentin  400 mg Oral TID  . pantoprazole (PROTONIX) IV  40 mg Intravenous Q24H  . phenytoin  100 mg Oral TID   Continuous Infusions: . cefTRIAXone (ROCEPHIN)  IV Stopped (08/02/16 0737)   PRN Meds:.acetaminophen **OR** acetaminophen, albuterol, bisacodyl, hydrALAZINE, morphine injection, ondansetron **OR** ondansetron (ZOFRAN) IV, oxyCODONE, promethazine No Known Allergies   Review of Systems  Constitutional: Positive for activity change, appetite change and fatigue.  HENT: Positive for sinus pressure, sore throat and trouble swallowing. Negative for congestion, ear pain and hearing loss.   Eyes: Negative for visual disturbance.  Respiratory: Positive for cough, chest tightness and shortness of breath.   Cardiovascular: Negative for chest pain.  Gastrointestinal: Positive for abdominal distention. Negative for abdominal pain (none presently) and nausea.  Genitourinary: Positive for decreased urine volume.  Musculoskeletal: Positive for gait problem. Negative for back pain.  Neurological: Positive for speech difficulty and weakness. Negative for dizziness, light-headedness and headaches.  Psychiatric/Behavioral: Positive for sleep disturbance. Negative for confusion and decreased concentration. The patient is nervous/anxious.    Physical Exam  Constitutional: She is oriented to person, place, and time. She has a sickly appearance. Face mask in place.  Frail woman in bed. Appears younger than stated age.  HENT:  Head: Normocephalic and atraumatic.  Mouth/Throat: Mucous membranes are dry.  Eyes: EOM are normal.  Neck: Normal range of motion. Neck  supple.  Cardiovascular: Regular rhythm.  Tachycardia present.   Pulmonary/Chest: She has rhonchi (scattered).  BiPAP.   Abdominal: Bowel sounds are normal. She exhibits distension. There is generalized tenderness.  Musculoskeletal:  Generalized weakness with R weaker compared to left. Fatigue and respiratory status limits exam  Neurological: She is alert and oriented to person, place, and time.  Skin: Skin is warm and dry. There is pallor.  Psychiatric: She has a normal mood and affect. Her speech is normal. Judgment and thought content normal. She is withdrawn. Cognition and memory are normal.    Vital Signs: BP 107/78   Pulse (!) 109   Temp 98.5 F (36.9 C) (Axillary)   Resp 12   Ht 5' 7"  (1.702 m)   Wt 68 kg (150 lb)   SpO2 91%   BMI 23.49 kg/m  Pain Assessment: No/denies pain   Pain Score: Asleep   SpO2: SpO2: 91 % O2 Device:SpO2: 91 % O2 Flow Rate: .O2 Flow Rate (L/min): 2 L/min  IO: Intake/output summary:  Intake/Output Summary (Last 24 hours) at 08/02/16 0843 Last data filed at 08/02/16 0600  Gross per 24 hour  Intake           1093.5 ml  Output              405 ml  Net            688.5 ml    LBM: Last BM Date: 07/30/16 Baseline Weight: Weight: 68 kg (150 lb) Most recent weight: Weight: 68 kg (150 lb)     Palliative Assessment/Data: PPS 20%    Time Total: 70 minutes Greater than 50%  of this time was spent counseling and coordinating care related to the above assessment and plan.  Signed by: Charlynn Court, NP Palliative Medicine Team Pager # [phone] (M-F 7a-5p) Team Phone # [phone] (Nights/Weekends)

## 2016-08-02 NOTE — Progress Notes (Signed)
PROGRESS NOTE    Ann Lynch  VEL:[id number] DOB: [birth year] DOA: 07/31/2016 PCP: Charolette Forward, MD   Brief Narrative: Ann Lynch is a 56 y.o. 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 She presented with nausea and vomiting and found to have a urinary tract infection.   Assessment & Plan:  Active Problems:   AKI (acute kidney injury) ([place])   Seizures (HCC)   Supratherapeutic INR   Pyelonephritis   Intractable nausea and vomiting   Chronic deep vein thrombosis (DVT) (HCC)   Positive pregnancy test   Hypokalemia   Severe protein-calorie malnutrition [name]: less than 60% of standard weight) (HCC)   History of CVA (cerebrovascular accident)   UTI Intractable nausea/vomiting Vomiting improved. Urine culture significant for E. Coli sensitive to ceftriaxone -Continue ceftriaxone  Pelvic fluid Elevated B-hcg Discussed with Dr. Arlina Robes of OB/Gyn faculty practice who recommends repeat b-hcg in 48 hours. Repeat slightly downtrending. Recommending outpatient follow-up although this may be a non-issue in the setting of patient's overall picture.  Metastatic disease Likely pancreatic in origin. This explains liver disease, ascites, loss of appetite and abdominal pain. -lasix -goals of care discussion prior to obtaining biopsies.  Anemia Chronic. Patient is perimenopausal. Secondary to acute blood loss from hemothorax. Patient is s/p 2u PRBC and 4u FFP. Hemoglobin stable.  History of proximal DVT involving iliac, common femoral, femoral and popliteal veins bilaterally Supratherapeutic INR Patient on coumadin. S/p IVC filter. INR now normal after FFP. -discontinue warfarin -elevate legs  History of CVA Patient with residual right-sided deficits -aspirin discontinued in setting of hemothorax  Depression Anxiety -continue Cymbalta -continue amitriptyline  Seizure disorder -continue  dilantin ER  Severe protein calorie malnutrition -Dietitian consult  Essential hypertension -metoprolol  Acute kidney injury Improved with IV fluids  Hydronephrosis Secondary to metastatic cancer. Creatinine worsening.  -monitor creatinine -urology consult pending palliative care discussions  Acute respiratory failure with hypoxia Respiratory distress Secondary to hemothorax. Improved with thoracentesis. -repeat CXR -wean off BiPAP -transfer to stepdown   DVT prophylaxis: SCDs Code Status: Full code Family Communication: Mother and sister at bedside Disposition Plan: Discharge pending medical stability   Consultants:   Gynecology  Gastroenterology  Procedures:   Thoracentesis: 1.5L blood fluid  Antimicrobials:  Ceftriaxone    Subjective: No chest pain or dyspnea  Objective: Vitals:   08/02/16 0900 08/02/16 0915 08/02/16 0917 08/02/16 1000  BP: 114/82  118/81 116/88  Pulse: (!) 111  (!) 113 (!) 115  Resp: 13  14 13   Temp:      TempSrc:      SpO2: 100% 99% 98% 100%  Weight:      Height:        Intake/Output Summary (Last 24 hours) at 08/02/16 1032 Last data filed at 08/02/16 1000  Gross per 24 hour  Intake           1093.5 ml  Output              605 ml  Net            488.5 ml   Filed Weights   07/26/16 1941  Weight: 68 kg (150 lb)    Examination:  General exam: Appears calm and comfortable Respiratory system: diminished to auscultation bilaterally. Unlabored work of breathing. No wheezing. Some rhonchi. Cardiovascular: Regular rate and rhythm. Normal S1 and S2. No heart murmurs present. No extra heart sounds Gastrointestinal: Soft, non-tender, distended, no guarding, no rebound, no  masses felt Central nervous system: Alert, oriented Psychiatry: Judgement and insight appear normal. Mood & affect depressed and flat.     Data Reviewed: I have personally reviewed following labs and imaging studies  CBC:  Recent Labs Lab  07/26/16 1950  07/29/16 0518 07/31/16 0554 08/01/16 1200 08/02/16 0250 08/02/16 0733  WBC 16.3*  < > 13.0* 15.2* 16.0* 14.2* 14.5*  NEUTROABS 13.3*  --   --   --   --   --   --   HGB 7.8*  < > 7.0* 6.9* 10.7* 8.8* 10.2*  HCT 24.1*  < > 22.8* 22.3* 33.5* 27.5* 31.8*  MCV 92.0  < > 94.2 94.9 93.6 94.8 95.2  PLT 454*  < > 386 419* 363 288 262  < > = values in this interval not displayed. Basic Metabolic Panel:  Recent Labs Lab 08/10/2016 1829 07/28/16 0754 07/29/16 0518 07/31/16 0554 08/01/16 1200 08/02/16 0733  NA  --  138 139 141 139 139  K  --  3.4* 3.2* 4.1 4.6 4.3  CL  --  104 105 110 106 104  CO2  --  22 25 24 22 24   GLUCOSE  --  106* 114* 114* 141* 112*  BUN  --  42* 30* 21* 26* 30*  CREATININE  --  1.07* 0.85 0.93 1.24* 1.42*  CALCIUM  --  7.9* 8.1* 8.4* 8.8* 8.9  MG 1.4* 2.0  --   --   --   --   PHOS 2.4*  --   --   --   --   --    GFR: Estimated Creatinine Clearance: 44.6 mL/min (A) (by C-G formula based on SCr of 1.42 mg/dL (H)). Liver Function Tests:  Recent Labs Lab 07/26/16 1950 07/28/16 0754 07/31/16 0554 08/01/16 1200 08/02/16 0733  AST 89* 115* 213* 353*  --   ALT 27 32 56* 84*  --   ALKPHOS 140* 112 132* 172*  --   BILITOT 1.3* 1.0 0.7 1.5*  --   PROT 8.6* 7.8 7.5 8.7* 8.4*  ALBUMIN 2.2* 1.8* 1.7* 1.9*  --    No results for input(s): LIPASE, AMYLASE in the last 168 hours.  Recent Labs Lab 07/31/16 1143  AMMONIA 40*   Coagulation Profile:  Recent Labs Lab 08/01/16 1200 08/01/16 1904 08/01/16 2005 08/02/16 0250 08/02/16 0733  INR 3.80 2.47 2.44 1.67 1.40   Cardiac Enzymes: No results for input(s): CKTOTAL, CKMB, CKMBINDEX, TROPONINI in the last 168 hours. BNP (last 3 results) No results for input(s): PROBNP in the last 8760 hours. HbA1C: No results for input(s): HGBA1C in the last 72 hours. CBG:  Recent Labs Lab 08/02/16 0832  GLUCAP 97   Lipid Profile: No results for input(s): CHOL, HDL, LDLCALC, TRIG, CHOLHDL, LDLDIRECT  in the last 72 hours. Thyroid Function Tests: No results for input(s): TSH, T4TOTAL, FREET4, T3FREE, THYROIDAB in the last 72 hours. Anemia Panel: No results for input(s): VITAMINB12, FOLATE, FERRITIN, TIBC, IRON, RETICCTPCT in the last 72 hours. Sepsis Labs:  Recent Labs Lab 07/26/16 2019 07/30/2016 0417  LATICACIDVEN 1.84 1.40    Recent Results (from the past 240 hour(s))  Culture, Urine     Status: Abnormal   Collection Time: 07/24/2016  2:58 AM  Result Value Ref Range Status   Specimen Description URINE, CATHETERIZED  Final   Special Requests NONE  Final   Culture >=100,000 COLONIES/mL ESCHERICHIA COLI (A)  Final   Report Status 07/30/2016 FINAL  Final   Organism ID, Bacteria ESCHERICHIA  COLI (A)  Final      Susceptibility   Escherichia coli - MIC*    AMPICILLIN >=32 RESISTANT Resistant     CEFAZOLIN <=4 SENSITIVE Sensitive     CEFTRIAXONE <=1 SENSITIVE Sensitive     CIPROFLOXACIN >=4 RESISTANT Resistant     GENTAMICIN <=1 SENSITIVE Sensitive     IMIPENEM <=0.25 SENSITIVE Sensitive     NITROFURANTOIN <=16 SENSITIVE Sensitive     TRIMETH/SULFA <=20 SENSITIVE Sensitive     AMPICILLIN/SULBACTAM 8 SENSITIVE Sensitive     PIP/TAZO <=4 SENSITIVE Sensitive     Extended ESBL NEGATIVE Sensitive     * >=100,000 COLONIES/mL ESCHERICHIA COLI  MRSA PCR Screening     Status: Abnormal   Collection Time: 08/01/16  8:03 PM  Result Value Ref Range Status   MRSA by PCR INVALID RESULTS, SPECIMEN SENT FOR CULTURE (A) NEGATIVE Final    Comment: NOTIFIED ROBERTS,J RN [id number] ON [id number] SKEEN,P        The GeneXpert MRSA Assay (FDA approved for NASAL specimens only), is one component of a comprehensive MRSA colonization surveillance program. It is not intended to diagnose MRSA infection nor to guide or monitor treatment for MRSA infections.   Gram stain     Status: None   Collection Time: 08/02/16  4:30 AM  Result Value Ref Range Status   Specimen Description PLEURAL FLUID  Final    Special Requests NONE  Final   Gram Stain   Final    FEW WBC PRESENT, PREDOMINANTLY PMN NO ORGANISMS SEEN    Report Status 08/02/2016 FINAL  Final         Radiology Studies: Ct Abdomen Pelvis W Contrast  Result Date: 07/31/2016 CLINICAL DATA:  Nausea and vomiting, urinary tract infection, suprapubic pain, abdominal tenderness and guarding, absent bowel sounds, history hypertension, stroke, chronic DVT, former smoker EXAM: CT ABDOMEN AND PELVIS WITH CONTRAST TECHNIQUE: Multidetector CT imaging of the abdomen and pelvis was performed using the standard protocol following bolus administration of intravenous contrast. CONTRAST:  134mL ISOVUE-300 IOPAMIDOL (ISOVUE-300) INJECTION 61% IV. Dilute oral contrast. COMPARISON:  CT pelvis 06/12/2016, CT chest 07/29/2016 FINDINGS: Lower chest: Large LEFT pleural effusion with complete atelectasis of lingula and LEFT lower lobe mediastinal shift LEFT-to-RIGHT. Small RIGHT pleural effusion with compressive atelectasis of RIGHT lower lobe. Hepatobiliary: Poorly defined low-attenuation foci within the liver suspicious for metastases measuring up to 19 x 17 mm lateral RIGHT lobe image 26. Gallbladder unremarkable. Background of fatty infiltration of liver. Pancreas: Pancreatic head and body normal appearance. Large poorly defined heterogeneous predominant low-attenuation mass within the pancreatic tail compatible with a pancreatic neoplasm measuring 7.7 x 5.0 x 7.0 cm. Mass is contiguous with the posterior gastric wall, medial border of spleen, and questionably LEFT adrenal gland. Conclusion of splenic vein. Spleen: Central focal area of low-attenuation 12 mm diameter cannot exclude metastasis Adrenals/Urinary Tract: Adrenal glands unremarkable. Tiny nonobstructing LEFT renal calculus. RIGHT hydronephrosis and hydroureter likely due to ureteral obstruction by tumor in pelvis. Stomach/Bowel: No evidence of bowel obstruction. Displaced bowel loops by ascites. Multiple  nodules within the small bowel mesenteric question interloop peritoneal metastases versus adenopathy. Vascular/Lymphatic: Splenic vein occlusion. IVC filter with question thrombus in IVC/filter atherosclerotic calcifications aorta and iliac arteries. Aorta normal caliber. Minimal coronary artery calcification LEFT main. Reproductive: Uterus unremarkable. Ovaries not discretely visualized. Other: Extensive ascites. Omental caking. Enhancing soft tissue nodules at anterior peritoneal surface at umbilicus. Tumor nodularity in lesser sac extending to adjacent to the duodenum. Tumor nodularity along peritoneal surfaces  of the pelvis. Tumor mass in RIGHT anterior pararenal space 5.3 x 3.0 cm image 52. Musculoskeletal: No acute osseous findings. IMPRESSION: Large pancreatic tail mass 7.7 x 5.0 x 7.0 cm compatible with a primary pancreatic neoplasm. Local extension of tumor to the posterior wall of the stomach, into lesser sac, and to medial spleen. 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 Hepatic metastases. Single enlarged retroperitoneal para-aortic node. IVC filter with suspect thrombus within the filter. RIGHT ureteral obstruction with hydronephrosis. Large LEFT pleural effusion and significant LEFT lung atelectasis. Small RIGHT pleural effusion and mild RIGHT basilar atelectasis. Aortic Atherosclerosis (ICD10-I70.0). Findings called to Dr. Lonny Prude on 07/31/2016 at 1755 hours. Electronically Signed   By: Lavonia Dana M.D.   On: 07/31/2016 17:58   Dg Chest Port 1 View  Result Date: 08/02/2016 CLINICAL DATA:  Increasing shortness of Breath EXAM: PORTABLE CHEST 1 VIEW COMPARISON:  08/02/2016 FINDINGS: Cardiac shadow is stable. The right lung remains clear. Increasing density is noted of the left hemithorax. This may represent some increasing pleural effusion although the possibility of re-expansion edema deserves consideration as well given recent thoracentesis. No acute bony abnormality is noted.  IMPRESSION: Increasing density in the left hemithorax which may be in part due to reaccumulation of pleural fluid although re-expansion edema deserves consideration as well. Electronically Signed   By: [name] M.D.   On: 08/02/2016 08:27   Dg Chest Port 1 View  Result Date: 08/02/2016 CLINICAL DATA:  56 year old female status post left-sided thoracentesis. EXAM: PORTABLE CHEST 1 VIEW COMPARISON:  Radiograph dated 08/01/2016 FINDINGS: There has been interval decrease in the size of the left pleural effusion with significant improvement in the aeration of the left lung. Small left pleural effusion remains. There is no pneumothorax. The right lung is clear. The cardiac silhouette is within normal limits. No acute osseous pathology. IMPRESSION: Significant decrease in the left pleural effusion and improvement in aeration of the left lung compared to the earlier radiograph. No pneumothorax. Electronically Signed   By: Anner Crete M.D.   On: 08/02/2016 05:10   Dg Chest Port 1 View  Result Date: 08/01/2016 CLINICAL DATA:  Shortness of Breath EXAM: PORTABLE CHEST 1 VIEW COMPARISON:  07/29/2016 FINDINGS: Continued opacification of left hemithorax is noted predominately related to pleural effusions similar to that seen on prior chest CT. Mild mediastinal shift to the right is noted. No other focal abnormality is seen. IMPRESSION: Opacification of left hemithorax stable from the prior exam consistent with large left pleural effusion. No new focal abnormality is noted. Electronically Signed   By: [name] M.D.   On: 08/01/2016 18:54        Scheduled Meds: . amitriptyline  25 mg Oral QHS  . atorvastatin  40 mg Oral QHS  . DULoxetine  20 mg Oral QHS  . feeding supplement  1 Container Oral TID BM  . gabapentin  400 mg Oral TID  . pantoprazole  40 mg Oral Daily  . phenytoin  100 mg Oral TID   Continuous Infusions: . cefTRIAXone (ROCEPHIN)  IV Stopped (08/02/16 0737)     LOS: 6 days      Cordelia Poche, MD [facility] 08/02/2016, 10:32 AM Pager: [phone]  If 7PM-7AM, please contact night-coverage [email or web address] Password [facility] 08/02/2016, 10:32 AM

## 2016-08-02 NOTE — Procedures (Signed)
Thoracentesis Procedure Note  Pre-operative Diagnosis: Pleural effusion  Post-operative Diagnosis: Hemothorax  Indications: therapeutic  Procedure Details  Consent: Informed consent was obtained. Risks of the procedure were discussed including: infection, bleeding, pain, pneumothorax.  Under sterile conditions the patient was positioned sitting upright on edge of bed with RN holding her up and supporting her. Chloropreps and sterile drapes were utilized.  8cc of 1% Lidocaine was used to anesthetize the LEFT POSTERIOR rib space. Fluid was obtained without any difficulties and minimal blood loss.  A dressing was applied to the wound and wound care instructions were provided.   Findings 1500 ml of frankly bloody pleural fluid was obtained. A sample was sent to Pathology for cytogenetics, flow, and cell counts, as well as for infection analysis.  Complications: None; patient tolerated the procedure well.        Condition: stable   Plan A follow up chest x-ray was ordered. Bed Rest for 3 hours. Tylenol 650 mg. for pain.  Vernie Murders, MD Pulmonary & Critical Care Medicine

## 2016-08-02 NOTE — Consult Note (Signed)
PULMONARY / CRITICAL CARE MEDICINE   Name: Ann Lynch MRN: [id number] DOB: [birth year]    ADMISSION DATE:  08/17/2016 CONSULTATION DATE: 08/01/16  REFERRING MD:  Dr Lonny Prude  CHIEF COMPLAINT: Respiratory distress  HISTORY OF PRESENT ILLNESS:   55yoF with Prior CVA (with residual left-sided hemiparesis, now chronically wheelchair bound), HTN, GERD, SZ, BLE DVT's (diagnosed in 05/2016, s/p IVC filter, on coumadin up until this admission), Depression, Anxiety, Chronic pain, and Anemia, now admitted 08/06/2016 with decreased PO intake, abdominal pain, weight loss, and recent UTI. She was found at time of admission to have AKI and was admitted to the hospital. Since then, CT Abdomen has diagnosed 7.7x5x7cm pancreatic tail mass with extension of the tumor into her stomach, into the lesser sac, and the medial spleen, with extensive peritoneal carcinomatosis, hepatic mets, and IVC filter with thrombus within it, as well as right ureteral obstruction with hydronephrosis, large left pleural effusion, and small right pleural effusion. Oncology had consulted on patient this hospitalization and was awaiting biopsy. Then tonight patient developed respiratory distress requiring transfer to ICU for BIPAP.  At time of my exam, patient is on low BIPAP settings with good Pox and no respiratory distress or accessory muscle use. She is alert but confused, only oriented to year "18" but not to place or condition. Patient's mother (POA) and sister are present as are several teenage family members. The mother and sister say they were not aware at all of this diagnosis and that no one had talked to them. I discussed advanced metastatic pancreatic cancer in detail with them including option for chemotherapy. Discussed that Oncology in their note doubted whether patient was strong enough to tolerate chemotherapy given her comorbidities and deconditioning. They agreed and said they wouldn't want chemotherapy. They said they  want her to be DNR. I discussed home hospice with them, and they seem agreeable. They said they would still like thoracentesis if possible for comfort purposes.   REVIEW OF SYSTEMS:   ROS VITAL SIGNS: BP 118/81   Pulse (!) 113   Temp 98.5 F (36.9 C) (Axillary)   Resp 14   Ht 5\' 7"  (1.702 m)   Wt 68 kg (150 lb)   SpO2 98%   BMI 23.49 kg/m   VENTILATOR SETTINGS: Vent Mode: BIPAP;PCV FiO2 (%):  [30 %-40 %] 40 % Set Rate:  [15 bmp-16 bmp] 16 bmp PEEP:  [8 cmH20] 8 cmH20  INTAKE / OUTPUT: I/O last 3 completed shifts: In: 1449.5 [P.O.:50; I.V.:50; Blood:1349.5] Out: 705 [Urine:705]  PHYSICAL EXAMINATION: General: Middle aged female, lying in ICU bed on BIPAP, deconditioned and chronically ill appearing  Neuro: alert but only oriented to time "18". Is attempting to speak or ask questions but speech is slurred and difficult to understand. She is not oriented to place. She can squeeze left hand to command. Has right-sided hemiparesis.  HEENT: OP clear, Mucous membranes moist, PERRL, EOMI Cardiovascular: Mild tachycardia (HR 100) with regular rhythm Lungs: CTA on right, decreased breath sounds in left. No accessory muscle use while on BIPAP Abdomen: Distended, + fluid wave, non-tender to palpation, absent bowel sounds Musculoskeletal: 2+ RLE and Trace LLE edema Skin: no rashes   LABS:  BMET  Recent Labs Lab 07/31/16 0554 08/01/16 1200 08/02/16 0733  NA 141 139 139  K 4.1 4.6 4.3  CL 110 106 104  CO2 24 22 24   BUN 21* 26* 30*  CREATININE 0.93 1.24* 1.42*  GLUCOSE 114* 141* 112*   Electrolytes  Recent Labs  Lab 08/01/2016 1829 07/28/16 0754  07/31/16 0554 08/01/16 1200 08/02/16 0733  CALCIUM  --  7.9*  < > 8.4* 8.8* 8.9  MG 1.4* 2.0  --   --   --   --   PHOS 2.4*  --   --   --   --   --   < > = values in this interval not displayed. CBC  Recent Labs Lab 08/01/16 1200 08/02/16 0250 08/02/16 0733  WBC 16.0* 14.2* 14.5*  HGB 10.7* 8.8* 10.2*  HCT 33.5* 27.5*  31.8*  PLT 363 288 262   Coag's  Recent Labs Lab 08/01/16 2005 08/02/16 0250 08/02/16 0733  INR 2.44 1.67 1.40   Sepsis Markers  Recent Labs Lab 07/26/16 2019 07/22/2016 0417  LATICACIDVEN 1.84 1.40   ABG  Recent Labs Lab 08/01/16 1850  PHART 7.314*  PCO2ART 44.2  PO2ART 76.8*   Liver Enzymes  Recent Labs Lab 07/28/16 0754 07/31/16 0554 08/01/16 1200  AST 115* 213* 353*  ALT 32 56* 84*  ALKPHOS 112 132* 172*  BILITOT 1.0 0.7 1.5*  ALBUMIN 1.8* 1.7* 1.9*   Cardiac Enzymes No results for input(s): TROPONINI, PROBNP in the last 168 hours.  Glucose  Recent Labs Lab 08/02/16 0832  GLUCAP 97    Imaging Dg Chest Port 1 View  Result Date: 08/02/2016 CLINICAL DATA:  Increasing shortness of Breath EXAM: PORTABLE CHEST 1 VIEW COMPARISON:  08/02/2016 FINDINGS: Cardiac shadow is stable. The right lung remains clear. Increasing density is noted of the left hemithorax. This may represent some increasing pleural effusion although the possibility of re-expansion edema deserves consideration as well given recent thoracentesis. No acute bony abnormality is noted. IMPRESSION: Increasing density in the left hemithorax which may be in part due to reaccumulation of pleural fluid although re-expansion edema deserves consideration as well. Electronically Signed   By: [name] M.D.   On: 08/02/2016 08:27   Dg Chest Port 1 View  Result Date: 08/02/2016 CLINICAL DATA:  56 year old female status post left-sided thoracentesis. EXAM: PORTABLE CHEST 1 VIEW COMPARISON:  Radiograph dated 08/01/2016 FINDINGS: There has been interval decrease in the size of the left pleural effusion with significant improvement in the aeration of the left lung. Small left pleural effusion remains. There is no pneumothorax. The right lung is clear. The cardiac silhouette is within normal limits. No acute osseous pathology. IMPRESSION: Significant decrease in the left pleural effusion and improvement in  aeration of the left lung compared to the earlier radiograph. No pneumothorax. Electronically Signed   By: Anner Crete M.D.   On: 08/02/2016 05:10   Dg Chest Port 1 View  Result Date: 08/01/2016 CLINICAL DATA:  Shortness of Breath EXAM: PORTABLE CHEST 1 VIEW COMPARISON:  07/29/2016 FINDINGS: Continued opacification of left hemithorax is noted predominately related to pleural effusions similar to that seen on prior chest CT. Mild mediastinal shift to the right is noted. No other focal abnormality is seen. IMPRESSION: Opacification of left hemithorax stable from the prior exam consistent with large left pleural effusion. No new focal abnormality is noted. Electronically Signed   By: [name] M.D.   On: 08/01/2016 18:54   LINES/TUBES: 2-22G PIV's  I reviewed CXR myself, pleural effusion recurred  ASSESSMENT / PLAN: 55yoF with Prior CVA (with residual left-sided hemiparesis, now chronically wheelchair bound), HTN, GERD, SZ, BLE DVT's (diagnosed in 05/2016, s/p IVC filter, on coumadin up until this admission), Depression, Anxiety, Chronic pain, and Anemia, now admitted 08/13/2016 with decreased PO intake,  abdominal pain, weight loss, diagnosed with UTI, AKI, and new diagnosis Pancreatic cancer with extension of the tumor into her stomach and spleen, with extensive peritoneal carcinomatosis, hepatic mets, and IVC filter with thrombus within it, as well as right ureteral obstruction with hydronephrosis, large left pleural effusion, and small right pleural effusion.   PULMONARY 1. Respiratory distress; Pleural effusions: - L>R pleural effusion - No further taps at this point - BiPAP to PRN at this point  CARDIOVASCULAR 1. History HTN: - Hold home anti-HTN  RENAL 1. AKI; Hydronephrosis: - Cr improved - Getting volume in form of FFP tonight; recheck BMP in AM - Replace electrolytes as indicated - Continue Foley catheter - Hydronephrosis presumably caused by tumor invasion of the  ureter  GASTROINTESTINAL 1. GERD: - Continue GI prophylaxis  2. Transaminitis: - Due to the liver mets; continue to follow.  HEMATOLOGIC 1. Anemia; BLE DVT's; Supratherapeutic INR: - Monitor - Hold further transfusion for now  2. Metastatic Pancreatic cancer with direct growth into spleen and stomach and Mets to Liver; Pleural effusion (presumed Malignant).  - Spoke with mother, DNR status confirmed.  Informed that fluid recurred and that the patient has no reasonable chance at survival and that we should be discussing comfort.  She agreed.  She would like patient to go home with hospice but now she understands that that may not be a possibility.  Asked her to speak with palliative care regarding available options.  INFECTIOUS 1. Ecoli UTI: - Continue Ceftriaxone (started 7/10, now day 7)  ENDOCRINE 1. Elevated Beta-HCG:  - This is related to the tumor itself. Patient is NOT pregnant.   NEUROLOGIC 1. Acute Encephalopathy; History CVA; History SZ: - Recommend morphine for comfort and shortness of breath but will defer to palliative  FAMILY  - Updates: See family discussion above.  Discussed with Dr. Teryl Lucy will transfer to SDU and PCCM will sign off.  The patient is critically ill with multiple organ systems failure and requires high complexity decision making for assessment and support, frequent evaluation and titration of therapies, application of advanced monitoring technologies and extensive interpretation of multiple databases.   Critical Care Time devoted to patient care services described in this note is  35  Minutes. This time reflects time of care of this signee Dr Jennet Maduro. This critical care time does not reflect procedure time, or teaching time or supervisory time of PA/NP/Med student/Med Resident etc but could involve care discussion time.  Rush Farmer, M.D. [facility] Pulmonary/Critical Care Medicine. Pager: [phone]. After hours pager:  [phone].  08/02/2016, 10:12 AM

## 2016-08-02 NOTE — Progress Notes (Deleted)
INR still too high even after the additional 2u FFP and the 10 of Vit K. Patient appears comfortable on BIPAP. Will plan to repeat INR in the AM and possibly can do thoracentesis later today if INR improves.

## 2016-08-02 NOTE — Progress Notes (Signed)
OT Cancellation Note  Patient Details Name: Ann Lynch MRN: [id number] DOB: 1960-02-15   Cancelled Treatment:    Reason Eval/Treat Not Completed: Medical issues which prohibited therapy. Spoke with RN, pt moved to ICU, on Bipap and awaiting meeting with family with Pallative care hopefully later today.  Almon Register [phone] 08/02/2016, 10:15 AM

## 2016-08-03 LAB — MRSA CULTURE: CULTURE: NOT DETECTED

## 2016-08-03 MED ORDER — SODIUM CHLORIDE 0.9 % IV SOLN
1.0000 mg/h | INTRAVENOUS | Status: DC
  Filled 2016-08-03: qty 10

## 2016-08-03 MED ORDER — MORPHINE BOLUS VIA INFUSION
2.0000 mg | INTRAVENOUS | Status: DC | PRN
  Filled 2016-08-03: qty 2

## 2016-08-03 MED ORDER — MORPHINE SULFATE (PF) 4 MG/ML IV SOLN
2.0000 mg | INTRAVENOUS | Status: DC | PRN
  Administered 2016-08-04 (×3): 2 mg via INTRAVENOUS
  Filled 2016-08-03 (×3): qty 1

## 2016-08-03 MED ORDER — METOPROLOL TARTRATE 5 MG/5ML IV SOLN
5.0000 mg | Freq: Once | INTRAVENOUS | Status: AC
  Administered 2016-08-03: 5 mg via INTRAVENOUS
  Filled 2016-08-03: qty 5

## 2016-08-03 MED ORDER — GLYCOPYRROLATE 0.2 MG/ML IJ SOLN: 0.2000 mg | INTRAMUSCULAR | Status: DC | PRN

## 2016-08-03 NOTE — Progress Notes (Signed)
PROGRESS NOTE    Ann Lynch  XTA:[id number] DOB: January 21, 1960 DOA: 08/02/2016 PCP: Charolette Forward, MD   Brief Narrative: Ann Lynch is a 56 y.o. 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 She presented with nausea and vomiting and found to have a urinary tract infection. INR is significantly elevated and thought to be secondary to warfarin. Initial attempts for CT scan was unsuccessful secondary to elevated beta-hCG. Transvaginal ultrasound significant for no intrauterine or ectopic pregnancy. KUB obtained and was significant for left pleural effusion. Subsequent CT scan of her chest significant for significant left-sided pleural effusion with evidence of liver nodules. CT abdomen was eventually obtained which showed significant carcinomatosis of her abdomen with concern from primary pancreatic cancer. INR continued to remain elevated and was thought to be secondary to metastatic liver disease. Plans were to obtain pleural and ascitic fluid for biopsy and she was reversed with fresh frozen plasma. Unfortunately, patient developed significant rest. Distress requiring more fresh frozen plasma and eventually thoracentesis which yielded 1.5 L of frankly bloody fluid. At this time, patient was made DO NOT RESUSCITATE with poor prognosis in the setting of metastatic cancer. Oncology was consulted prior to patient's decompensation and initially was going to consider chemotherapy after biopsy. Patient is now on BiPAP with plans to transition to full comfort care with the expectation that she will likely die in the hospital.   Assessment & Plan:  Active Problems:   AKI (acute kidney injury) ([place])   Seizures (HCC)   Supratherapeutic INR   Pyelonephritis   Intractable nausea and vomiting   Chronic deep vein thrombosis (DVT) (HCC)   Positive pregnancy test   Hypokalemia   Severe protein-calorie malnutrition [name]: less than  60% of standard weight) (HCC)   History of CVA (cerebrovascular accident)   Metastatic cancer (HCC)   SOB (shortness of breath)   Hemothorax   Goals of care, counseling/discussion   Palliative care by specialist   UTI Intractable nausea/vomiting Vomiting improved. Urine culture significant for E. Coli sensitive to ceftriaxone -Discontinue ceftriaxone secondary to comfort care  Pelvic fluid Elevated B-hcg Discussed with Dr. Arlina Robes of OB/Gyn faculty practice who recommends repeat b-hcg in 48 hours. Repeat slightly downtrending. Recommending outpatient follow-up although this may be a non-issue in the setting of patient's overall picture. Patient is not comfort care  Metastatic disease Likely pancreatic in origin. This explains liver disease, ascites, loss of appetite and abdominal pain. Comfort care  Anemia Chronic. Patient is perimenopausal. Secondary to acute blood loss from hemothorax. Patient is s/p 2u PRBC and 4u FFP. Hemoglobin stable. Comfort care  History of proximal DVT involving iliac, common femoral, femoral and popliteal veins bilaterally Supratherapeutic INR Patient on coumadin. S/p IVC filter. INR now normal after FFP. Elevate legs. Comfort care  History of CVA Patient with residual right-sided deficits.  Depression Anxiety -continue Cymbalta -continue amitriptyline  Seizure disorder -continue dilantin ER  Severe protein calorie malnutrition -Dietitian consulted  Essential hypertension Comfort care.  Acute kidney injury Improved with IV fluids.  Hydronephrosis Secondary to metastatic cancer. Comfort care.  Acute respiratory failure with hypoxia Respiratory distress Secondary to hemothorax. Improved with thoracentesis. Comfort care   DVT prophylaxis: SCDs Code Status: Full code Family Communication: Mother at bedside Disposition Plan: Anticipate hospital death   Consultants:    Gynecology  Gastroenterology  Oncology  CCM  Palliative care medicine  Procedures:   Thoracentesis: 1.5L blood fluid  Antimicrobials:  Ceftriaxone  Subjective: Resting. On bipap  Objective: Vitals:   08/03/16 0742 08/03/16 0800 08/03/16 0900 08/03/16 1000  BP: (!) 136/102 (!) 115/98  (!) 124/91  Pulse: (!) 112 (!) 110 (!) 110 (!) 111  Resp: 14 14 11 20   Temp: 99.6 F (37.6 C)     TempSrc: Oral     SpO2: 99% 99% 99% 99%  Weight:      Height:        Intake/Output Summary (Last 24 hours) at 08/03/16 1139 Last data filed at 08/03/16 0800  Gross per 24 hour  Intake              910 ml  Output              310 ml  Net              600 ml   Filed Weights   07/26/16 1941  Weight: 68 kg (150 lb)    Examination:  General exam: Appears calm and comfortable Respiratory system: Diminished breath sounds bilaterally Cardiovascular: rapid rate and rhythm. Normal S1 and S2. No heart murmurs present. No extra heart sounds Central nervous system: Sleeping   Data Reviewed: I have personally reviewed following labs and imaging studies  CBC:  Recent Labs Lab 07/29/16 0518 07/31/16 0554 08/01/16 1200 08/02/16 0250 08/02/16 0733  WBC 13.0* 15.2* 16.0* 14.2* 14.5*  HGB 7.0* 6.9* 10.7* 8.8* 10.2*  HCT 22.8* 22.3* 33.5* 27.5* 31.8*  MCV 94.2 94.9 93.6 94.8 95.2  PLT 386 419* 363 288 528   Basic Metabolic Panel:  Recent Labs Lab 08/03/2016 1829 07/28/16 0754 07/29/16 0518 07/31/16 0554 08/01/16 1200 08/02/16 0733  NA  --  138 139 141 139 139  K  --  3.4* 3.2* 4.1 4.6 4.3  CL  --  104 105 110 106 104  CO2  --  22 25 24 22 24   GLUCOSE  --  106* 114* 114* 141* 112*  BUN  --  42* 30* 21* 26* 30*  CREATININE  --  1.07* 0.85 0.93 1.24* 1.42*  CALCIUM  --  7.9* 8.1* 8.4* 8.8* 8.9  MG 1.4* 2.0  --   --   --   --   PHOS 2.4*  --   --   --   --   --    GFR: Estimated Creatinine Clearance: 44.6 mL/min (A) (by C-G formula based on SCr of 1.42 mg/dL  (H)). Liver Function Tests:  Recent Labs Lab 07/28/16 0754 07/31/16 0554 08/01/16 1200 08/02/16 0733  AST 115* 213* 353*  --   ALT 32 56* 84*  --   ALKPHOS 112 132* 172*  --   BILITOT 1.0 0.7 1.5*  --   PROT 7.8 7.5 8.7* 8.4*  ALBUMIN 1.8* 1.7* 1.9*  --    No results for input(s): LIPASE, AMYLASE in the last 168 hours.  Recent Labs Lab 07/31/16 1143  AMMONIA 40*   Coagulation Profile:  Recent Labs Lab 08/01/16 1200 08/01/16 1904 08/01/16 2005 08/02/16 0250 08/02/16 0733  INR 3.80 2.47 2.44 1.67 1.40   Cardiac Enzymes: No results for input(s): CKTOTAL, CKMB, CKMBINDEX, TROPONINI in the last 168 hours. BNP (last 3 results) No results for input(s): PROBNP in the last 8760 hours. HbA1C: No results for input(s): HGBA1C in the last 72 hours. CBG:  Recent Labs Lab 08/02/16 0832  GLUCAP 97   Lipid Profile: No results for input(s): CHOL, HDL, LDLCALC, TRIG, CHOLHDL, LDLDIRECT in the last 72 hours. Thyroid Function  Tests: No results for input(s): TSH, T4TOTAL, FREET4, T3FREE, THYROIDAB in the last 72 hours. Anemia Panel: No results for input(s): VITAMINB12, FOLATE, FERRITIN, TIBC, IRON, RETICCTPCT in the last 72 hours. Sepsis Labs: No results for input(s): PROCALCITON, LATICACIDVEN in the last 168 hours.  Recent Results (from the past 240 hour(s))  Culture, Urine     Status: Abnormal   Collection Time: 08/12/2016  2:58 AM  Result Value Ref Range Status   Specimen Description URINE, CATHETERIZED  Final   Special Requests NONE  Final   Culture >=100,000 COLONIES/mL ESCHERICHIA COLI (A)  Final   Report Status 07/30/2016 FINAL  Final   Organism ID, Bacteria ESCHERICHIA COLI (A)  Final      Susceptibility   Escherichia coli - MIC*    AMPICILLIN >=32 RESISTANT Resistant     CEFAZOLIN <=4 SENSITIVE Sensitive     CEFTRIAXONE <=1 SENSITIVE Sensitive     CIPROFLOXACIN >=4 RESISTANT Resistant     GENTAMICIN <=1 SENSITIVE Sensitive     IMIPENEM <=0.25 SENSITIVE  Sensitive     NITROFURANTOIN <=16 SENSITIVE Sensitive     TRIMETH/SULFA <=20 SENSITIVE Sensitive     AMPICILLIN/SULBACTAM 8 SENSITIVE Sensitive     PIP/TAZO <=4 SENSITIVE Sensitive     Extended ESBL NEGATIVE Sensitive     * >=100,000 COLONIES/mL ESCHERICHIA COLI  MRSA PCR Screening     Status: Abnormal   Collection Time: 08/01/16  8:03 PM  Result Value Ref Range Status   MRSA by PCR INVALID RESULTS, SPECIMEN SENT FOR CULTURE (A) NEGATIVE Final    Comment: NOTIFIED ROBERTS,J RN [id number] ON [id number] SKEEN,P        The GeneXpert MRSA Assay (FDA approved for NASAL specimens only), is one component of a comprehensive MRSA colonization surveillance program. It is not intended to diagnose MRSA infection nor to guide or monitor treatment for MRSA infections.   Gram stain     Status: None   Collection Time: 08/02/16  4:30 AM  Result Value Ref Range Status   Specimen Description PLEURAL FLUID  Final   Special Requests NONE  Final   Gram Stain   Final    FEW WBC PRESENT, PREDOMINANTLY PMN NO ORGANISMS SEEN    Report Status 08/02/2016 FINAL  Final         Radiology Studies: Dg Chest Port 1 View  Result Date: 08/02/2016 CLINICAL DATA:  Increasing shortness of Breath EXAM: PORTABLE CHEST 1 VIEW COMPARISON:  08/02/2016 FINDINGS: Cardiac shadow is stable. The right lung remains clear. Increasing density is noted of the left hemithorax. This may represent some increasing pleural effusion although the possibility of re-expansion edema deserves consideration as well given recent thoracentesis. No acute bony abnormality is noted. IMPRESSION: Increasing density in the left hemithorax which may be in part due to reaccumulation of pleural fluid although re-expansion edema deserves consideration as well. Electronically Signed   By: [name] M.D.   On: 08/02/2016 08:27   Dg Chest Port 1 View  Result Date: 08/02/2016 CLINICAL DATA:  56 year old female status post left-sided thoracentesis. EXAM:  PORTABLE CHEST 1 VIEW COMPARISON:  Radiograph dated 08/01/2016 FINDINGS: There has been interval decrease in the size of the left pleural effusion with significant improvement in the aeration of the left lung. Small left pleural effusion remains. There is no pneumothorax. The right lung is clear. The cardiac silhouette is within normal limits. No acute osseous pathology. IMPRESSION: Significant decrease in the left pleural effusion and improvement in aeration of the left lung  compared to the earlier radiograph. No pneumothorax. Electronically Signed   By: Anner Crete M.D.   On: 08/02/2016 05:10   Dg Chest Port 1 View  Result Date: 08/01/2016 CLINICAL DATA:  Shortness of Breath EXAM: PORTABLE CHEST 1 VIEW COMPARISON:  07/29/2016 FINDINGS: Continued opacification of left hemithorax is noted predominately related to pleural effusions similar to that seen on prior chest CT. Mild mediastinal shift to the right is noted. No other focal abnormality is seen. IMPRESSION: Opacification of left hemithorax stable from the prior exam consistent with large left pleural effusion. No new focal abnormality is noted. Electronically Signed   By: [name] M.D.   On: 08/01/2016 18:54        Scheduled Meds: . amitriptyline  25 mg Oral QHS  . atorvastatin  40 mg Oral QHS  . DULoxetine  20 mg Oral QHS  . feeding supplement  1 Container Oral TID BM  . gabapentin  400 mg Oral TID  . pantoprazole  40 mg Oral Daily  . phenytoin  100 mg Oral TID   Continuous Infusions: . cefTRIAXone (ROCEPHIN)  IV Stopped (08/03/16 0606)  . morphine       LOS: 7 days     Cordelia Poche, MD [facility] 08/03/2016, 11:39 AM Pager: [phone]  If 7PM-7AM, please contact night-coverage [email or web address] Password [facility] 08/03/2016, 11:39 AM

## 2016-08-03 NOTE — Progress Notes (Signed)
More family members are arriving to see pt. Pt's mother states more family will be arriving at 3:30 this afternoon. Will keep Sarah with Palliative Care informed. Will continue to monitor pt.

## 2016-08-03 NOTE — Progress Notes (Signed)
Chaplain visited with family of this patient.  Family is appreciative of Chaplain presence and support but not really talking much about anything that is happening.  Mother states "all we can do is pray".  Chaplain will support this family in prayer and provide ministry of presence.  Please page Chaplain as needed for this family.    Hospitality offered.

## 2016-08-03 NOTE — Progress Notes (Signed)
Daily Progress Note   Patient Name: Ann Lynch       Date: 08/03/2016 DOB: 1960/10/31  Age: 56 y.o. MRN#: [id number] Attending Physician: Mariel Aloe, MD Primary Care Physician: Charolette Forward, MD Admit Date: 07/29/2016  Reason for Consultation/Follow-up: Establishing goals of care, Non pain symptom management, Psychosocial/spiritual support, Terminal Care and Withdrawal of life-sustaining treatment  Subjective: Ann Lynch will wake to voice but did not want to verbally engage me. She was appropriately responding to her family when they spoke with her. She expresses no complaints or concerns when asked by her family.   Length of Stay: 7  Current Medications: Scheduled Meds:  . amitriptyline  25 mg Oral QHS  . atorvastatin  40 mg Oral QHS  . DULoxetine  20 mg Oral QHS  . feeding supplement  1 Container Oral TID BM  . gabapentin  400 mg Oral TID  . pantoprazole  40 mg Oral Daily  . phenytoin  100 mg Oral TID    Continuous Infusions: . cefTRIAXone (ROCEPHIN)  IV Stopped (08/03/16 0606)    PRN Meds: acetaminophen **OR** acetaminophen, albuterol, bisacodyl, hydrALAZINE, LORazepam, morphine injection, ondansetron **OR** ondansetron (ZOFRAN) IV, oxyCODONE, promethazine  Physical Exam        Constitutional: She is oriented to person, place, and time. She has a sickly appearance. Face mask in place.  Frail woman in bed. Appears younger than stated age. Lethargic this morning. HENT:  Head: Normocephalic and atraumatic.  Mouth/Throat: Mucous membranes are dry.  Eyes: EOM are normal.  Neck: Normal range of motion. Neck supple.  Cardiovascular: Regular rhythm.  Tachycardia present.   Pulmonary/Chest: She has rhonchi (scattered).  BiPAP.   Abdominal: Bowel sounds are normal. She exhibits distension. There is generalized  tenderness.  Musculoskeletal:  Generalized weakness with R weaker compared to left. Fatigue and respiratory status limit exam  Neurological: She is alert and oriented to person, place, and time.  Skin: Skin is warm and dry. There is pallor.  Psychiatric: Her speech is normal. Judgment and thought content normal. She is withdrawn. Cognition and memory are normal.  Flat affect, does not want to verbally engage me but will speak with family.    Vital Signs: BP (!) 136/102   Pulse (!) 112   Temp 99.6 F (37.6 C) (Oral)   Resp 14   Ht 5' 7"  (1.702 m)   Wt 68 kg (150 lb)   SpO2 99%   BMI 23.49 kg/m  SpO2: SpO2: 99 % O2 Device: O2 Device: Bi-PAP O2 Flow Rate: O2 Flow Rate (L/min): 2 L/min  Intake/output summary:  Intake/Output Summary (Last 24 hours) at 08/03/16 0845 Last data filed at 08/02/16 2309  Gross per 24 hour  Intake              740 ml  Output              410 ml  Net              330 ml   LBM: Last BM Date: 07/30/16 Baseline Weight: Weight: 68 kg (150 lb) Most recent weight: Weight: 68 kg (150 lb)  Palliative Assessment/Data: PPS 20%     Patient Active Problem List  Diagnosis Date Noted  . Metastatic cancer ([place])   . SOB (shortness of breath)   . Hemothorax   . Goals of care, counseling/discussion   . Palliative care by specialist   . Pyelonephritis 08/08/2016  . Intractable nausea and vomiting 07/26/2016  . Chronic deep vein thrombosis (DVT) ([place]) 08/08/2016  . Positive pregnancy test 07/21/2016  . Hypokalemia 07/30/2016  . Severe protein-calorie malnutrition [name]: less than 60% of standard weight) ([place]) 08/08/2016  . History of CVA (cerebrovascular accident) 08/08/2016  . Abdominal pain   . Dehydration   . Acute blood loss anemia   . Seizures ([place])   . Benign essential HTN   . Chronic pain syndrome   . Supratherapeutic INR   . Abdominal discomfort   . Debilitated 06/17/2016  . DVT of lower extremity, bilateral ([place]) 06/17/2016  . Sepsis due to  cellulitis ([place]) 06/11/2016  . Aphasia 06/08/2013  . Slurred speech 06/08/2013  . AKI (acute kidney injury) ([place]) 06/08/2013  . CVA, old, hemiparesis ([place]) 06/08/2013  . HTN (hypertension) 06/08/2013    Palliative Care Assessment & Plan   HPI: 56 y.o. 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 She presented to the ED with decreased PO intake, abdominal pain, and weight loss. In the ED she was noted to have AKI. She was admitted on 08/11/2016. 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 There was also extensive peritoneal carcinomatosis, hepatic mets, and IVC filter with thrombus within it, as well as right ureteral obstruction with hydronephrosis, large left pleural effusion, and small right pleural effusion. Oncology consulted and clarified this was an incurable malignancy, with the only treatment option of chemotherapy. Unfortunately, she is a poor candidate for treatment given her poor performance status. Critical Care then met with pt and family (mother and sister) and explained the diagnosis and prognosis; they elected for DNR, no chemotherapy, and wanted more information on support option for her at home, to possibly include Hospice. Palliative consulted to assist pt and family in goals of care and disposition.  Assessment: I met with the patient's mother and sister yesterday afternoon. We talked through their understanding of Ann Lynch's medical issues, and their goals surrounding her care. They expressed that the family had talked amongst themselves and with the patient, and the plan was to focus on comfort at this point. They understand her time is likely very limited, and they wish for her to remain here as a full transition to comfort measures is made.  Yesterday, they asked for all procedures and labs to be stopped.  Today, I met again with the patient and her mother at the bedside. Ann Lynch was very lethargic and deferred to her mother for decisions. We talked through the plan today, which is to allow for family to arrive, then transition her off the BiPAP and onto a facemask, then nasal cannula. There is no ETA for family, so Berenice Primas will call me when she knows/when they arrive.   Recommendations/Plan:  DNR, transitioning to full comfort measures  Plan to transition off BiPAP today. I will order morphine drip to have ready and on the floor in preparation for that transition.   ADDENDUM 1600: I've been waiting to hear from the patient's mother regarding when family would arrive today. I then  stopped by the room to check-in. They are still waiting on the patient's daughter. They are not sure when she will arrive, now expecting tomorrow. They do not have a way to contact her to confirm this. As they are waiting for her to arrive, they do not want Ann Lynch to come off the BiPAP given the expected acute decline that will ensue. I will check in tomorrow morning to follow-up on timing.   Goals of Care and Additional Recommendations:  Limitations on Scope of Treatment: Full Comfort Care  Code Status:  DNR  Prognosis:   Hours - Days  Discharge Planning:  Anticipated Hospital Death  Care plan was discussed with pt's mother [facility])  Thank you for allowing the Palliative Medicine Team to assist in the care of this patient.  Time in/out: 0840/0900    Greater than 50%  of this time was spent counseling and coordinating care related to the above assessment and plan.  Charlynn Court, NP Palliative Medicine Team [phone] pager (7a-5p) Team Phone # [phone]

## 2016-08-03 NOTE — Care Management Note (Signed)
Case Management Note Marvetta Gibbons RN, BSN Unit 4E-Case Manager- [place] coverage [phone]  Patient Details  Name: Ann Lynch MRN: [id number] Date of Birth: [birth year]  Subjective/Objective:  Pt admitted N/V -abd pain- work up with Newly diagnosed pancreatic mass with liver metastases and large left pleural effusion with ascites             Action/Plan: PTA pt lived alone- PC consulted and plan for comfort care- per PC note- hospital death anticipated- CM to follow    Expected Discharge Date:                  Expected Discharge Plan:  Expired  In-House Referral:     Discharge planning Services  CM Consult  Post Acute Care Choice:  NA Choice offered to:  NA  DME Arranged:    DME Agency:     HH Arranged:    [place] Agency:     Status of Service:  In process, will continue to follow  If discussed at Long Length of Stay Meetings, dates discussed:    Discharge Disposition:   Additional Comments:  Dawayne Peyson, RN 08/03/2016, 11:13 AM

## 2016-08-04 ENCOUNTER — Encounter: Payer: Medicare HMO | Admitting: Physical Medicine & Rehabilitation

## 2016-08-04 DIAGNOSIS — C799 Secondary malignant neoplasm of unspecified site: Secondary | ICD-10-CM

## 2016-08-04 DIAGNOSIS — Z515 Encounter for palliative care: Secondary | ICD-10-CM

## 2016-08-04 MED ORDER — POLYVINYL ALCOHOL 1.4 % OP SOLN
1.0000 [drp] | Freq: Four times a day (QID) | OPHTHALMIC | Status: DC | PRN
  Filled 2016-08-04: qty 15

## 2016-08-04 MED ORDER — LORAZEPAM 2 MG/ML IJ SOLN
0.5000 mg | INTRAMUSCULAR | Status: DC | PRN
  Administered 2016-08-05: 2 mg via INTRAVENOUS
  Administered 2016-08-05: 0.5 mg via INTRAVENOUS
  Filled 2016-08-04 (×2): qty 1

## 2016-08-04 MED ORDER — GLYCOPYRROLATE 0.2 MG/ML IJ SOLN
0.2000 mg | INTRAMUSCULAR | Status: DC
  Administered 2016-08-04 – 2016-08-05 (×6): 0.2 mg via INTRAVENOUS
  Filled 2016-08-04 (×6): qty 1

## 2016-08-04 MED ORDER — BIOTENE DRY MOUTH MT LIQD: 15.0000 mL | OROMUCOSAL | Status: DC | PRN

## 2016-08-04 MED ORDER — MORPHINE SULFATE (PF) 4 MG/ML IV SOLN
2.0000 mg | INTRAVENOUS | Status: DC | PRN
  Administered 2016-08-04 – 2016-08-05 (×14): 2 mg via INTRAVENOUS
  Filled 2016-08-04 (×14): qty 1

## 2016-08-04 NOTE — Progress Notes (Signed)
Per patient and family request, bipap taken off. Patient placed on nasal cannula. Pt maintaining sats in the high 90's. Will continue to monitor. Roselyn Reef Mima [place],RN

## 2016-08-04 NOTE — Plan of Care (Signed)
Problem: Safety: Goal: Ability to remain free from injury will improve Outcome: Progressing No falls during this admission. Call bell within reach. Bed in low and locked position. Patient alert. 3/4 siderails in place. Clean and clear environment maintained. Patient verbalized understanding of safety instruction.  Problem: Pain Managment: Goal: General experience of comfort will improve Outcome: Progressing Patient denies pain at this time. Vital signs are stable at this time. No facial grimacing or moaning evident at this time.

## 2016-08-04 NOTE — Progress Notes (Signed)
Daily Progress Note   Patient Name: Ann Lynch       Date: 08/04/2016 DOB: May 03, 1960  Age: 56 y.o. MRN#: [id number] Attending Physician: Waldron Session, MD Primary Care Physician: Charolette Forward, MD Admit Date: 07/20/2016  Reason for Consultation/Follow-up: Establishing goals of care, Non pain symptom management, Psychosocial/spiritual support, Terminal Care and Withdrawal of life-sustaining treatment  Subjective: Her family asked for the BiPAP to be removed around 10am this morning, and she currently has a depressed respiratory rate, she is gurgling secretions, and she is minimally responsive. Extensive family is around her bedside and providing support. They have numerous questions about timeline, medication use, and what to expect.   Length of Stay: 8  Current Medications: Scheduled Meds:  . glycopyrrolate  0.2 mg Intravenous Q4H    Continuous Infusions:   PRN Meds: acetaminophen **OR** [DISCONTINUED] acetaminophen, albuterol, antiseptic oral rinse, bisacodyl, hydrALAZINE, LORazepam, [DISCONTINUED] morphine **OR** morphine injection, [DISCONTINUED] ondansetron **OR** ondansetron (ZOFRAN) IV, polyvinyl alcohol, promethazine  Physical Exam        Constitutional:  Pt is minimally responsive. She appears toxic. No signs of distress or discomfort.  HENT:  Head: Bruising on forehead.  Mouth/Throat: Mucous membranes are dry.  Eyes:  Eyes closed, will infrequently track with eyes.  Neck: Normal range of motion. Neck supple.  Cardiovascular: Regular rhythm.  Tachycardia present.   Pulmonary/Chest:  Poor air flow with scattered rhonchi. Pt not clearing own secretions with audible gurgling. Shallow breaths with depressed rate (9 bpm) Abdominal: Bowel sounds are diminished. She exhibits distension. There is generalized  tenderness.  Musculoskeletal:  She is no longer moving herself in bed. Wincing with passive ROM.  Neurological:  Minimally responsive.  Skin: Skin is warm and dry. There is pallor.  Psychiatric:  UTA  Vital Signs: BP 117/87   Pulse (!) 129   Temp 99 F (37.2 C) (Oral)   Resp 13   Ht _0  (1.702 m)   Wt 68 kg (150 lb)   SpO2 100%   BMI 23.49 kg/m  SpO2: SpO2: 100 % O2 Device: O2 Device: Bi-PAP O2 Flow Rate: O2 Flow Rate (L/min): 2 L/min  Intake/output summary:   Intake/Output Summary (Last 24 hours) at 08/04/16 1027 Last data filed at 08/03/16 1727  Gross per 24 hour  Intake               50 ml  Output                0 ml  Net               50 ml   LBM: Last BM Date: 07/30/16 Baseline Weight: Weight: 68 kg (150 lb) Most recent weight: Weight: 68 kg (150 lb)  Palliative Assessment/Data: PPS 10%     Patient Active Problem List   Diagnosis Date Noted  . Metastatic cancer ([place])   . SOB (shortness of breath)   . Hemothorax   . Goals of care, counseling/discussion   . Palliative care by specialist   . Pyelonephritis 07/30/2016  . Intractable nausea and vomiting 08/04/2016  . Chronic deep vein thrombosis (DVT) ([place]) 08/14/2016  . Positive pregnancy test 08/03/2016  . Hypokalemia 07/31/2016  . Severe  protein-calorie malnutrition [name]: less than 60% of standard weight) ([place]) 07/21/2016  . History of CVA (cerebrovascular accident) 08/10/2016  . Abdominal pain   . Dehydration   . Acute blood loss anemia   . Seizures ([place])   . Benign essential HTN   . Chronic pain syndrome   . Supratherapeutic INR   . Abdominal discomfort   . Debilitated 06/17/2016  . DVT of lower extremity, bilateral ([place]) 06/17/2016  . Sepsis due to cellulitis ([place]) 06/11/2016  . Aphasia 06/08/2013  . Slurred speech 06/08/2013  . AKI (acute kidney injury) ([place]) 06/08/2013  . CVA, old, hemiparesis ([place]) 06/08/2013  . HTN (hypertension) 06/08/2013    Palliative Care Assessment & Plan    HPI: 56 y.o. 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 She presented to the ED with decreased PO intake, abdominal pain, and weight loss. In the ED she was noted to have AKI. She was admitted on 07/29/2016. 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 There was also extensive peritoneal carcinomatosis, hepatic mets, and IVC filter with thrombus within it, as well as right ureteral obstruction with hydronephrosis, large left pleural effusion, and small right pleural effusion. Oncology consulted and clarified this was an incurable malignancy, with the only treatment option of chemotherapy. Unfortunately, she is a poor candidate for treatment given her poor performance status. Critical Care then met with pt and family (mother and sister) and explained the diagnosis and prognosis; they elected for DNR, no chemotherapy, and wanted more information on support option for her at home, to possibly include Hospice. Palliative consulted to assist pt and family in goals of care and disposition.  Assessment: Ms. Andy has been surrounding by family for the past few days, however there are multiple family members who have not yet been able to arrive. I've been working with the family regarding timeline for removing the BiPAP, with the expectation that she may only have hours to days once it is stopped (I suspect the hours timeframe will be more accurate). Her family, specifically her mother, has expressed the desire to remove the BiPAP, but has struggled with the timeline for it.   This morning, around 10am, the family requested the BiPAP be removed. I have strongly recommended a morphine drip to ensure symptom management, which was a recommendation based on her respiratory struggle  I've observed the past few days, and her underlying disease state that carries the risk of significant symptom burden. Unfortunately, her family was told this morning that a palliative medication drip would hasten her death and they are very adamant about not starting it until her daughter arrives from school (unclear on when this will be, but they expect her at some point this afternoon). Despite reassurance and education, I could not move them from this inaccurate belief. I discussed the use of bolus medication with them, and reinforced that she may ultimately need a drip if her symptoms are not well controlled with this approach. She does appear comfortable at present, and I discussed medication use with the family and care nurse. Finally, her family did ask that the telemetry monitoring remain on. They find comfort in tracking her changes, and understand these vitals are expected to change.   Recommendations/Plan:  DNR, BiPAP is now off and pt is  on full comfort measures. I have adjusted her medication to ensure good symptom management, and have spoken with the care nurse and the family about medication availability.   Goals of Care and Additional Recommendations:  Limitations on Scope of Treatment: Full Comfort Care  Code Status:  DNR  Prognosis:   Hours - Days  Discharge Planning:  Anticipated Hospital Death  Care plan was discussed with pt's mother (HCPOA), family, chaplain, and care nurse  Thank you for allowing the Palliative Medicine Team to assist in the care of this patient.  Time in/out: 0800/0820; 1020/1040; 1300/1310 Total time: 75 minutes    Greater than 50%  of this time was spent counseling and coordinating care related to the above assessment and plan.  Charlynn Court, NP Palliative Medicine Team [phone] pager (7a-5p) Team Phone # [phone]

## 2016-08-04 NOTE — Progress Notes (Signed)
Chaplain upon follow up with this patient met several other family members.  Mother and daughter of patient both emotional, appropriately.  Family is gathering around the bed expressing how tired they feel the patient is.  Family is telling stories and trying to make meaning of different sounds or expressions the patient is making.  Chaplain support and presence is appreciated.  Chaplain will remain available for this patient and family.  Chaplain would like to thank the medical team for caring for this patient and her family.    08/04/16 0930  Clinical Encounter Type  Visited With Patient and family together  Visit Type Follow-up;Psychological support;Spiritual support;Social support;Critical Care  Referral From Nurse  Consult/Referral To Chaplain  Spiritual Encounters  Spiritual Needs Emotional;Grief support

## 2016-08-04 NOTE — Progress Notes (Signed)
56 year old female with a metastatic cancer , who is under comfort measures only, I discussed with the family members wanted to proceed with the comfort measures with morphine drip whenever the mother arrives , the patient is in dying situation, and expected expired soon.

## 2016-08-04 NOTE — Progress Notes (Signed)
Follow-up: Notified by RN that pt's daughter insisting pt be put back on BiPAP tonight because she is having a hard time breathing. Per MD note pt currently "Full Comfort Care". Pt's daughter has declined morphine drip stating to RN that someone told her the morphine drip will "speed up her death". So pt is currently receiving very frequent IV morphine boluses and [place]. RN reports pt in NAD though clearly actively dying w/ occasional "guppy" type respirations. Of note daughter has also requested pt remain on the Dynamap monitor so she can monitor her VS. Attempted to discuss daughter's concerns with her and current "comfort only" approach to care. She became somewhat confrontational and angry stating "the doctor" told her that if they wanted pt to go back on BiPAP for difficulty breathing they would be allowed to do so. Stating if the family does not get what they were promised she will go to the news and report our neglect. I politely ended our conversation. Respiratory was called and ask to place pt back on BiPAP. Respiratory willing to manage BiPAP on floor tonight, understanding that if pt survives the night and family requests continued use of palliative Bi-PAP pt may need to be transferred back to SDU. Will continue to monitor closely.   Jeryl [place], NP-C [facility] Pager [phone]

## 2016-08-04 NOTE — Discharge Summary (Signed)
Physician Discharge Summary  Ann Lynch CZY:[id number] DOB: [birth year] DOA: 07/18/2016  PCP: Charolette Forward, MD  Admit date: 08/15/2016 Discharge date: 08/04/2016  Admitted From:   Home Health:(NO) Equipment/Devices:none   Discharge Condition: (Expired)  CODE STATUS:DNR  Diet recommendation: Heart Healthy / Carb Modified / Regular / Dysphagia   Brief/Interim Summary:  56 y.o. 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 She presented with nausea and vomiting and found to have a urinary tract infection. INR was significantly elevated and thought to be secondary to warfarin. Initial attempts for CT scan was unsuccessful . secondary to elevated beta-hCG. Transvaginal  ultrasound done and showed no significant findings for no intrauterine or ectopic pregnancy. KUB obtained and was significant for left pleural effusion. Subsequent CT scan of her chest significant for significant left-sided pleural effusion with evidence of liver nodules. CT abdomen was eventually obtained which showed significant carcinomatosis of her abdomen with concern from primary pancreatic tail cancer with liver mets. INR continued to remain elevated and was thought to be secondary to metastatic liver disease. Plans were to obtain pleural and ascitic fluid for biopsy and she was reversed with fresh frozen plasma. Unfortunately, patient developed significant rest. Distress requiring more fresh frozen plasma and eventually thoracentesis which yielded 1.5 L of frankly bloody fluid. At this time, patient was made DO NOT RESUSCITATE with poor prognosis in the setting of metastatic cancer. After discussion with the family , with her very poor prognosis , Patient transitioned to full comfort care , Morphin PRN initiated and the patient went to cardiac arrest peacefully at 07:10 AM on 2016-09-01 .  Discharge Diagnoses:      Metastatic cancer ([place])   AKI (acute  kidney injury) ([place])   Seizures ([place])   Supratherapeutic INR   Pyelonephritis   Intractable nausea and vomiting   Chronic deep vein thrombosis (DVT) ([place])   Positive pregnancy test   Hypokalemia   Severe protein-calorie malnutrition [name]: less than 60% of standard weight) ([facility])   History of CVA (cerebrovascular accident)   SOB (shortness of breath)   Hemothorax   Goals of care, counseling/discussion   Palliative care    Terminal care    Discharge Instructions     No Known Allergies  Consultations:  Oncology.   Procedures/Studies: Dg Chest 2 View  Result Date: 07/28/2016 CLINICAL DATA:  Pleural effusion EXAM: CHEST  2 VIEW COMPARISON:  CT chest dated 06/17/2016 FINDINGS: Complete opacification of the left hemithorax. Right lung is clear. No pneumothorax pre The heart is normal in size. IMPRESSION: Complete opacification of the left hemithorax. This appearance is nonspecific but could reflect a large pleural effusion, as per the provided clinical history. Electronically Signed   By: Julian Hy M.D.   On: 07/28/2016 18:06   Ct Chest W Contrast  Result Date: 07/29/2016 CLINICAL DATA:  Abnormal chest x-ray with opacification of left hemithorax. EXAM: CT CHEST WITH CONTRAST TECHNIQUE: Multidetector CT imaging of the chest was performed during intravenous contrast administration. CONTRAST:  40mL ISOVUE-300 IOPAMIDOL (ISOVUE-300) INJECTION 61% COMPARISON:  Chest radiograph yesterday.  Chest CT 06/17/2016 FINDINGS: Cardiovascular: Tortuous thoracic aorta with mild atherosclerosis. There are coronary artery calcifications. Heart normal in size, mild rightward mediastinal shift. Mediastinum/Nodes: Rightward mediastinal shift due to right pleural effusion. Left lower paratracheal adenopathy appears grossly similar, no motor confluent nodes measuring 2.6 x 0.8 cm. No additional mediastinal adenopathy. No hilar adenopathy. Thyroid nodule in the right lobe is subcentimeter and does not  meet criteria for biopsy or need further evaluation. Probable intraluminal mucus/debris in the upper trachea image 24 series 7. Lungs/Pleura: Large left pleural effusion, mildly heterogeneous near completely filling the left hemithorax. There is complete atelectasis of the left lung. Small right pleural effusion with adjacent compressive atelectasis. No pulmonary edema. Upper Abdomen: There are scattered low-density lesions throughout the liver, ill-defined, largest measuring 18 mm in the subcapsular right lobe. Nodular hepatic contour suggests cirrhosis. Moderate abdominal ascites in the visualized upper abdomen. Musculoskeletal: Chronic deformities of left lateral ribs may be sequela of remote prior injury There are no acute or suspicious osseous abnormalities. IMPRESSION: 1. Large left pleural effusion causing complete atelectasis of the left lung and rightward mediastinal shift. 2. Small right pleural effusion and adjacent atelectasis. 3. Moderate upper abdominal ascites. Multiple low-density lesions in the liver nonspecific. There is question of nodular hepatic contours raising concern for cirrhosis. Recommend correlation with cirrhosis risk factors. Hepatic protocol MRI recommended for further characterization when patient is able tolerate breath hold technique. 4. Coronary artery calcifications. Mild thoracic aortic atherosclerosis per Aortic Atherosclerosis (ICD10-I70.0). Electronically Signed   By: Jeb Levering M.D.   On: 07/29/2016 22:18   US Transvaginal Non-ob  Result Date: 08/12/2016 CLINICAL DATA:  Initial evaluation for acute lower pelvic pain, elevated beta HCG, question ovarian mass. EXAM: TRANSABDOMINAL AND TRANSVAGINAL ULTRASOUND OF PELVIS TECHNIQUE: Both transabdominal and transvaginal ultrasound examinations of the pelvis were performed. Transabdominal technique was performed for global imaging of the pelvis including uterus, ovaries, adnexal regions, and pelvic cul-de-sac. It was  necessary to proceed with endovaginal exam following the transabdominal exam to visualize the uterus and ovaries. COMPARISON:  Prior CT from 06/12/2016. FINDINGS: Uterus Measurements: 9.2 x 3.9 x 4.3 cm. Uterus heterogeneous without focal mass. Uterus is anteverted. Endometrium Thickness: 5 mm.  No focal abnormality visualized. Right ovary Measurements: 2.6 x 2.0 x 2.2 cm. Normal appearance/no adnexal mass. Left ovary Not visualized.  No adnexal mass. Other findings Large volume free fluid within the pelvis. IMPRESSION: 1. Normal sonographic appearance of the right ovary, with nonvisualization of the left ovary. No adnexal mass evident by sonography. 2. Large volume free fluid within the pelvis. 3. Normal sonographic appearance of the uterus. Electronically Signed   By: Jeannine Boga M.D.   On: 08/02/2016 06:20   US Pelvis Complete  Result Date: 08/07/2016 CLINICAL DATA:  Initial evaluation for acute lower pelvic pain, elevated beta HCG, question ovarian mass. EXAM: TRANSABDOMINAL AND TRANSVAGINAL ULTRASOUND OF PELVIS TECHNIQUE: Both transabdominal and transvaginal ultrasound examinations of the pelvis were performed. Transabdominal technique was performed for global imaging of the pelvis including uterus, ovaries, adnexal regions, and pelvic cul-de-sac. It was necessary to proceed with endovaginal exam following the transabdominal exam to visualize the uterus and ovaries. COMPARISON:  Prior CT from 06/12/2016. FINDINGS: Uterus Measurements: 9.2 x 3.9 x 4.3 cm. Uterus heterogeneous without focal mass. Uterus is anteverted. Endometrium Thickness: 5 mm.  No focal abnormality visualized. Right ovary Measurements: 2.6 x 2.0 x 2.2 cm. Normal appearance/no adnexal mass. Left ovary Not visualized.  No adnexal mass. Other findings Large volume free fluid within the pelvis. IMPRESSION: 1. Normal sonographic appearance of the right ovary, with nonvisualization of the left ovary. No adnexal mass evident by  sonography. 2. Large volume free fluid within the pelvis. 3. Normal sonographic appearance of the uterus. Electronically Signed   By: Jeannine Boga M.D.   On: 08/04/2016 06:20   Ct Abdomen Pelvis W Contrast  Result Date: 07/31/2016 CLINICAL DATA:  Nausea and vomiting, urinary tract infection, suprapubic pain, abdominal tenderness and guarding, absent bowel sounds, history hypertension, stroke, chronic DVT, former smoker EXAM: CT ABDOMEN AND PELVIS WITH CONTRAST TECHNIQUE: Multidetector CT imaging of the abdomen and pelvis was performed using the standard protocol following bolus administration of intravenous contrast. CONTRAST:  163mL ISOVUE-300 IOPAMIDOL (ISOVUE-300) INJECTION 61% IV. Dilute oral contrast. COMPARISON:  CT pelvis 06/12/2016, CT chest 07/29/2016 FINDINGS: Lower chest: Large LEFT pleural effusion with complete atelectasis of lingula and LEFT lower lobe mediastinal shift LEFT-to-RIGHT. Small RIGHT pleural effusion with compressive atelectasis of RIGHT lower lobe. Hepatobiliary: Poorly defined low-attenuation foci within the liver suspicious for metastases measuring up to 19 x 17 mm lateral RIGHT lobe image 26. Gallbladder unremarkable. Background of fatty infiltration of liver. Pancreas: Pancreatic head and body normal appearance. Large poorly defined heterogeneous predominant low-attenuation mass within the pancreatic tail compatible with a pancreatic neoplasm measuring 7.7 x 5.0 x 7.0 cm. Mass is contiguous with the posterior gastric wall, medial border of spleen, and questionably LEFT adrenal gland. Conclusion of splenic vein. Spleen: Central focal area of low-attenuation 12 mm diameter cannot exclude metastasis Adrenals/Urinary Tract: Adrenal glands unremarkable. Tiny nonobstructing LEFT renal calculus. RIGHT hydronephrosis and hydroureter likely due to ureteral obstruction by tumor in pelvis. Stomach/Bowel: No evidence of bowel obstruction. Displaced bowel loops by ascites. Multiple  nodules within the small bowel mesenteric question interloop peritoneal metastases versus adenopathy. Vascular/Lymphatic: Splenic vein occlusion. IVC filter with question thrombus in IVC/filter atherosclerotic calcifications aorta and iliac arteries. Aorta normal caliber. Minimal coronary artery calcification LEFT main. Reproductive: Uterus unremarkable. Ovaries not discretely visualized. Other: Extensive ascites. Omental caking. Enhancing soft tissue nodules at anterior peritoneal surface at umbilicus. Tumor nodularity in lesser sac extending to adjacent to the duodenum. Tumor nodularity along peritoneal surfaces of the pelvis. Tumor mass in RIGHT anterior pararenal space 5.3 x 3.0 cm image 52. Musculoskeletal: No acute osseous findings. IMPRESSION: Large pancreatic tail mass 7.7 x 5.0 x 7.0 cm compatible with a primary pancreatic neoplasm. Local extension of tumor to the posterior wall of the stomach, into lesser sac, and to medial spleen. 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 Hepatic metastases. Single enlarged retroperitoneal para-aortic node. IVC filter with suspect thrombus within the filter. RIGHT ureteral obstruction with hydronephrosis. Large LEFT pleural effusion and significant LEFT lung atelectasis. Small RIGHT pleural effusion and mild RIGHT basilar atelectasis. Aortic Atherosclerosis (ICD10-I70.0). Findings called to Dr. Lonny Prude on 07/31/2016 at 1755 hours. Electronically Signed   By: Lavonia Dana M.D.   On: 07/31/2016 17:58   US Renal  Result Date: 08/15/2016 CLINICAL DATA:  Acute kidney injury.  Pyelonephritis EXAM: RENAL / URINARY TRACT ULTRASOUND COMPLETE COMPARISON:  06/12/2016 renal ultrasound FINDINGS: Right Kidney: Length: 9 cm (likely underestimated due to partial visualization of the lower pole). Echogenic, consistent with medical renal disease. No hydronephrosis. Left Kidney: Length: 11 cm. Echogenic, consistent with medical renal disease. No hydronephrosis. Bladder:  Appears normal for degree of bladder distention. Small ascites.  Pleural effusion on the right at least. IMPRESSION: 1. No hydronephrosis. 2. Medical renal disease. 3. Small ascites that is new from 06/12/2016 sonogram. Right pleural effusion. Electronically Signed   By: Monte Fantasia M.D.   On: 08/09/2016 10:07   Dg Chest Port 1 View  Result Date: 08/02/2016 CLINICAL DATA:  Increasing shortness of Breath EXAM: PORTABLE CHEST 1 VIEW COMPARISON:  08/02/2016 FINDINGS: Cardiac shadow is stable. The right lung remains clear. Increasing density is noted of the left hemithorax. This may represent some  increasing pleural effusion although the possibility of re-expansion edema deserves consideration as well given recent thoracentesis. No acute bony abnormality is noted. IMPRESSION: Increasing density in the left hemithorax which may be in part due to reaccumulation of pleural fluid although re-expansion edema deserves consideration as well. Electronically Signed   By: [name] M.D.   On: 08/02/2016 08:27   Dg Chest Port 1 View  Result Date: 08/02/2016 CLINICAL DATA:  56 year old female status post left-sided thoracentesis. EXAM: PORTABLE CHEST 1 VIEW COMPARISON:  Radiograph dated 08/01/2016 FINDINGS: There has been interval decrease in the size of the left pleural effusion with significant improvement in the aeration of the left lung. Small left pleural effusion remains. There is no pneumothorax. The right lung is clear. The cardiac silhouette is within normal limits. No acute osseous pathology. IMPRESSION: Significant decrease in the left pleural effusion and improvement in aeration of the left lung compared to the earlier radiograph. No pneumothorax. Electronically Signed   By: Anner Crete M.D.   On: 08/02/2016 05:10   Dg Chest Port 1 View  Result Date: 08/01/2016 CLINICAL DATA:  Shortness of Breath EXAM: PORTABLE CHEST 1 VIEW COMPARISON:  07/29/2016 FINDINGS: Continued opacification of left  hemithorax is noted predominately related to pleural effusions similar to that seen on prior chest CT. Mild mediastinal shift to the right is noted. No other focal abnormality is seen. IMPRESSION: Opacification of left hemithorax stable from the prior exam consistent with large left pleural effusion. No new focal abnormality is noted. Electronically Signed   By: [name] M.D.   On: 08/01/2016 18:54   Dg Abd Portable 1v  Result Date: 07/28/2016 CLINICAL DATA:  Abdominal pain with decreased bowel sounds and nausea. Abdominal distention. EXAM: PORTABLE ABDOMEN - 1 VIEW COMPARISON:  CT scans of the chest dated 06/17/2016 and of the pelvis dated 06/12/2016 and chest x-ray dated 06/15/2016 FINDINGS: There is minimal air in nondistended bowel. There is overall increased density in the abdomen consistent with ascites or fluid filled bowel loops. There is a large left pleural effusion with complete opacification of the visualized portion of the left hemithorax. IVC filter in place. No acute bone abnormality. IMPRESSION: 1. Increased density in the abdomen suggestive of ascites and/or fluid filled bowel loops. No dilated bowel. 2. Large left pleural effusion. Electronically Signed   By: Lorriane Shire M.D.   On: 07/28/2016 11:57   Ir US Chest  Result Date: 07/30/2016 CLINICAL DATA:  Evaluate for thoracentesis.  INR is greater than 4. EXAM: CHEST ULTRASOUND COMPARISON:  None. FINDINGS: A moderate left pleural effusion is present. IMPRESSION: Moderate left pleural effusion. Electronically Signed   By: Marybelle Killings M.D.   On: 07/30/2016 14:19    (Echo, Carotid, EGD, Colonoscopy, ERCP)    Subjective:   Discharge Exam: Vitals:   08/04/16 1124 08/04/16 1200  BP: 109/85 109/84  Pulse: (!) 132 (!) 133  Resp: (!) 9 10  Temp:     Vitals:   08/04/16 0448 08/04/16 0815 08/04/16 1124 08/04/16 1200  BP: (!) 127/95 117/87 109/85 109/84  Pulse: (!) 130 (!) 129 (!) 132 (!) 133  Resp: 16 13 (!) 9 10  Temp:       TempSrc:   Oral   SpO2: 100% 100% 99% 98%  Weight:      Height:           The results of significant diagnostics from this hospitalization (including imaging, microbiology, ancillary and laboratory) are listed below for reference.  Microbiology: Recent Results (from the past 240 hour(s))  Culture, Urine     Status: Abnormal   Collection Time: 07/22/2016  2:58 AM  Result Value Ref Range Status   Specimen Description URINE, CATHETERIZED  Final   Special Requests NONE  Final   Culture >=100,000 COLONIES/mL ESCHERICHIA COLI (A)  Final   Report Status 07/30/2016 FINAL  Final   Organism ID, Bacteria ESCHERICHIA COLI (A)  Final      Susceptibility   Escherichia coli - MIC*    AMPICILLIN >=32 RESISTANT Resistant     CEFAZOLIN <=4 SENSITIVE Sensitive     CEFTRIAXONE <=1 SENSITIVE Sensitive     CIPROFLOXACIN >=4 RESISTANT Resistant     GENTAMICIN <=1 SENSITIVE Sensitive     IMIPENEM <=0.25 SENSITIVE Sensitive     NITROFURANTOIN <=16 SENSITIVE Sensitive     TRIMETH/SULFA <=20 SENSITIVE Sensitive     AMPICILLIN/SULBACTAM 8 SENSITIVE Sensitive     PIP/TAZO <=4 SENSITIVE Sensitive     Extended ESBL NEGATIVE Sensitive     * >=100,000 COLONIES/mL ESCHERICHIA COLI  MRSA PCR Screening     Status: Abnormal   Collection Time: 08/01/16  8:03 PM  Result Value Ref Range Status   MRSA by PCR INVALID RESULTS, SPECIMEN SENT FOR CULTURE (A) NEGATIVE Final    Comment: NOTIFIED ROBERTS,J RN [id number] ON [id number] SKEEN,P        The GeneXpert MRSA Assay (FDA approved for NASAL specimens only), is one component of a comprehensive MRSA colonization surveillance program. It is not intended to diagnose MRSA infection nor to guide or monitor treatment for MRSA infections.   MRSA culture     Status: None   Collection Time: 08/01/16  8:03 PM  Result Value Ref Range Status   Specimen Description NASAL SWAB  Final   Special Requests NONE  Final   Culture NO MRSA DETECTED  Final   Report Status  08/03/2016 FINAL  Final  Culture, body fluid-bottle     Status: None (Preliminary result)   Collection Time: 08/02/16  4:30 AM  Result Value Ref Range Status   Specimen Description PLEURAL FLUID  Final   Special Requests NONE  Final   Culture NO GROWTH 2 DAYS  Final   Report Status PENDING  Incomplete  Gram stain     Status: None   Collection Time: 08/02/16  4:30 AM  Result Value Ref Range Status   Specimen Description PLEURAL FLUID  Final   Special Requests NONE  Final   Gram Stain   Final    FEW WBC PRESENT, PREDOMINANTLY PMN NO ORGANISMS SEEN    Report Status 08/02/2016 FINAL  Final     Labs: BNP (last 3 results) No results for input(s): BNP in the last 8760 hours. Basic Metabolic Panel:  Recent Labs Lab 07/29/16 0518 07/31/16 0554 08/01/16 1200 08/02/16 0733  NA 139 141 139 139  K 3.2* 4.1 4.6 4.3  CL 105 110 106 104  CO2 25 24 22 24   GLUCOSE 114* 114* 141* 112*  BUN 30* 21* 26* 30*  CREATININE 0.85 0.93 1.24* 1.42*  CALCIUM 8.1* 8.4* 8.8* 8.9   Liver Function Tests:  Recent Labs Lab 07/31/16 0554 08/01/16 1200 08/02/16 0733  AST 213* 353*  --   ALT 56* 84*  --   ALKPHOS 132* 172*  --   BILITOT 0.7 1.5*  --   PROT 7.5 8.7* 8.4*  ALBUMIN 1.7* 1.9*  --    No results for input(s): LIPASE, AMYLASE in the  last 168 hours.  Recent Labs Lab 07/31/16 1143  AMMONIA 40*   CBC:  Recent Labs Lab 07/29/16 0518 07/31/16 0554 08/01/16 1200 08/02/16 0250 08/02/16 0733  WBC 13.0* 15.2* 16.0* 14.2* 14.5*  HGB 7.0* 6.9* 10.7* 8.8* 10.2*  HCT 22.8* 22.3* 33.5* 27.5* 31.8*  MCV 94.2 94.9 93.6 94.8 95.2  PLT 386 419* 363 288 262   Cardiac Enzymes: No results for input(s): CKTOTAL, CKMB, CKMBINDEX, TROPONINI in the last 168 hours. BNP: Invalid input(s): POCBNP CBG:  Recent Labs Lab 08/02/16 0832  GLUCAP 97   D-Dimer No results for input(s): DDIMER in the last 72 hours. Hgb A1c No results for input(s): HGBA1C in the last 72 hours. Lipid  Profile No results for input(s): CHOL, HDL, LDLCALC, TRIG, CHOLHDL, LDLDIRECT in the last 72 hours. Thyroid function studies No results for input(s): TSH, T4TOTAL, T3FREE, THYROIDAB in the last 72 hours.  Invalid input(s): FREET3 Anemia work up No results for input(s): VITAMINB12, FOLATE, FERRITIN, TIBC, IRON, RETICCTPCT in the last 72 hours. Urinalysis    Component Value Date/Time   COLORURINE AMBER (A) 08/10/2016 0258   APPEARANCEUR HAZY (A) 07/31/2016 0258   LABSPEC 1.015 07/21/2016 0258   PHURINE 5.0 08/02/2016 0258   GLUCOSEU NEGATIVE 08/01/2016 0258   HGBUR NEGATIVE 08/09/2016 0258   BILIRUBINUR NEGATIVE 07/23/2016 0258   KETONESUR NEGATIVE 07/20/2016 0258   PROTEINUR NEGATIVE 07/22/2016 0258   UROBILINOGEN 0.2 06/08/2013 2308   NITRITE NEGATIVE 08/06/2016 0258   LEUKOCYTESUR MODERATE (A) 08/03/2016 0258   Sepsis Labs Invalid input(s): PROCALCITONIN,  WBC,  LACTICIDVEN Microbiology Recent Results (from the past 240 hour(s))  Culture, Urine     Status: Abnormal   Collection Time: 08/14/2016  2:58 AM  Result Value Ref Range Status   Specimen Description URINE, CATHETERIZED  Final   Special Requests NONE  Final   Culture >=100,000 COLONIES/mL ESCHERICHIA COLI (A)  Final   Report Status 07/30/2016 FINAL  Final   Organism ID, Bacteria ESCHERICHIA COLI (A)  Final      Susceptibility   Escherichia coli - MIC*    AMPICILLIN >=32 RESISTANT Resistant     CEFAZOLIN <=4 SENSITIVE Sensitive     CEFTRIAXONE <=1 SENSITIVE Sensitive     CIPROFLOXACIN >=4 RESISTANT Resistant     GENTAMICIN <=1 SENSITIVE Sensitive     IMIPENEM <=0.25 SENSITIVE Sensitive     NITROFURANTOIN <=16 SENSITIVE Sensitive     TRIMETH/SULFA <=20 SENSITIVE Sensitive     AMPICILLIN/SULBACTAM 8 SENSITIVE Sensitive     PIP/TAZO <=4 SENSITIVE Sensitive     Extended ESBL NEGATIVE Sensitive     * >=100,000 COLONIES/mL ESCHERICHIA COLI  MRSA PCR Screening     Status: Abnormal   Collection Time: 08/01/16  8:03 PM   Result Value Ref Range Status   MRSA by PCR INVALID RESULTS, SPECIMEN SENT FOR CULTURE (A) NEGATIVE Final    Comment: NOTIFIED ROBERTS,J RN [id number] ON [id number] SKEEN,P        The GeneXpert MRSA Assay (FDA approved for NASAL specimens only), is one component of a comprehensive MRSA colonization surveillance program. It is not intended to diagnose MRSA infection nor to guide or monitor treatment for MRSA infections.   MRSA culture     Status: None   Collection Time: 08/01/16  8:03 PM  Result Value Ref Range Status   Specimen Description NASAL SWAB  Final   Special Requests NONE  Final   Culture NO MRSA DETECTED  Final   Report Status 08/03/2016 FINAL  Final  Culture, body fluid-bottle     Status: None (Preliminary result)   Collection Time: 08/02/16  4:30 AM  Result Value Ref Range Status   Specimen Description PLEURAL FLUID  Final   Special Requests NONE  Final   Culture NO GROWTH 2 DAYS  Final   Report Status PENDING  Incomplete  Gram stain     Status: None   Collection Time: 08/02/16  4:30 AM  Result Value Ref Range Status   Specimen Description PLEURAL FLUID  Final   Special Requests NONE  Final   Gram Stain   Final    FEW WBC PRESENT, PREDOMINANTLY PMN NO ORGANISMS SEEN    Report Status 08/02/2016 FINAL  Final     Time coordinating discharge: Over 30 minutes  SIGNED:   Waldron Session, MD  [facility] 08/04/2016, 4:21 PM Pager   If 7PM-7AM, please contact night-coverage [email or web address] Password [email or web address]

## 2016-08-05 LAB — PH, BODY FLUID: PH, BODY FLUID: 7.6

## 2016-08-07 LAB — CULTURE, BODY FLUID W GRAM STAIN -BOTTLE: Culture: NO GROWTH

## 2016-08-18 NOTE — Progress Notes (Signed)
Discussed with the pt's family on having pt on a morphine drip for more comfort, family refused.

## 2016-08-18 NOTE — Progress Notes (Signed)
Asked by patient's daughter,Crystal, to contact patient's mother, Doretha Sou, and inform her that the patient will most likely pass away in a few hours if not sooner.  I spoke with Ms. Alcario Drought on the telephone and informed her of the patient's status.  She said she was coming to the hospital.

## 2016-08-18 NOTE — Progress Notes (Signed)
Notified Lilia Pro in respiratory therapy that we no longer need the bipap for 6N09.  She said it was ok to leave the Bipap in our soiled utility room.

## 2016-08-18 NOTE — Progress Notes (Signed)
Chaplain responded to call for family support, pt EOL.  Family had stepped out of room when chaplain arrived, but daughter and cousin returned within moments.  Chaplain provided emotional support and then, per daughter's request, prayer. Chaplain offered prayer, scripture and commendation during which cousin and daughter cried out loudly.  Following prayer, daughter repeated several times, Thank you, Ann Lynch.  Chaplain offered empathy and grief support, and made clear that ongoing spiritual support was available at all times.  Please call as needed or requested.   Luana Shu [phone]     Aug 20, 2016 0500  Clinical Encounter Type  Visited With Patient and family together  Visit Type Initial;Patient actively dying  Referral From Nurse  Consult/Referral To Chaplain  Spiritual Encounters  Spiritual Needs Grief support;Prayer  Stress Factors  Family Stress Factors Major life changes

## 2016-08-18 NOTE — Progress Notes (Addendum)
RT called to pt room to assess alarming BiPAP. RT noticed there was no pt effort on machine, RT felt for pulse and could not find one, so RT left room and informed RN of situation. RN stated there was very minimal pulse at this time.

## 2016-08-18 NOTE — Progress Notes (Signed)
Offered Chaplain to visit.

## 2016-08-18 NOTE — Progress Notes (Signed)
Pt's family concerned about pt's breathing discomfort, insisted to have pt hooked to BIPAP. Paged doctor on call. Respiratory up on the floor and set up BIPAP to pt.

## 2016-08-18 NOTE — Progress Notes (Addendum)
Pt placed on BiPAP at this time per NP request due to family member asking for BiPAP. NP informed RT that pt was allowed to be on BiPAP on 6N.

## 2016-08-18 NOTE — Care Management Important Message (Signed)
Important Message  Patient Details  Name: Ann Lynch MRN: [id number] Date of Birth: Apr 24, 1960   Medicare Important Message Given:  No patient has passed away      [facility] 02-Sep-2016, 12:19 PM

## 2016-08-18 DEATH — deceased

## 2016-09-01 LAB — FUNGUS CULTURE WITH STAIN

## 2016-09-01 LAB — FUNGAL ORGANISM REFLEX

## 2016-09-01 LAB — FUNGUS CULTURE RESULT

## 2017-11-25 IMAGING — US IR CHEST US
1 series · 7 of 7 positions shown · non-contrast
Comparison: None.

CLINICAL DATA: Evaluate for thoracentesis.  INR is greater than 4.

EXAM:
CHEST ULTRASOUND

[Series 1: ir (id) (id)/(id)/(id) ir · 7 of 7 slices shown]
[im 1/7]
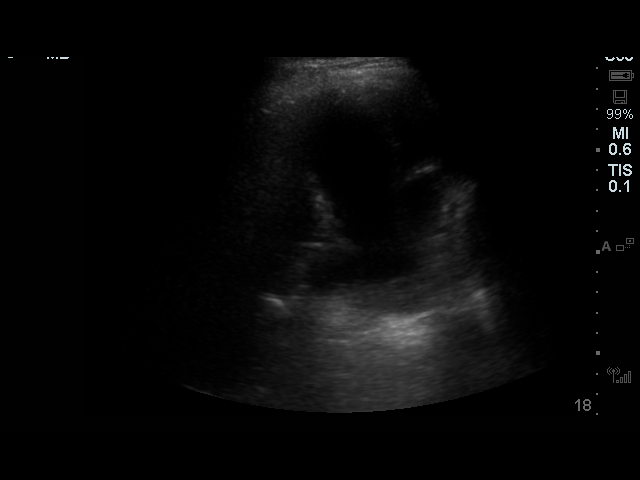
[im 2/7]
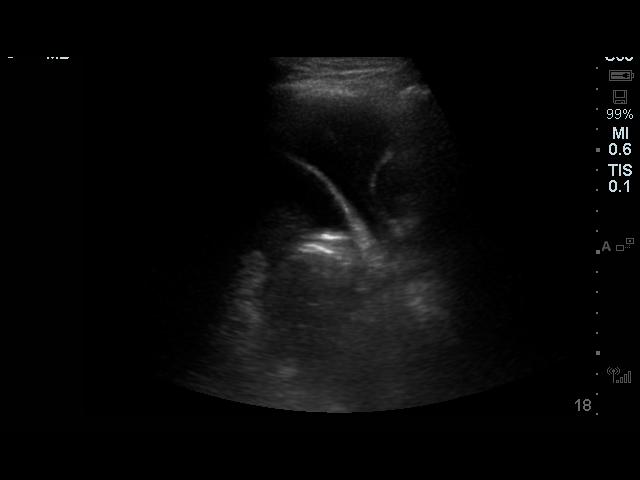
[im 3/7]
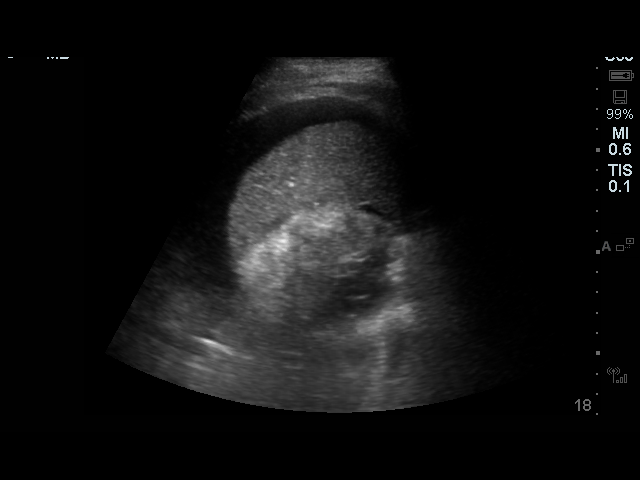
[im 4/7]
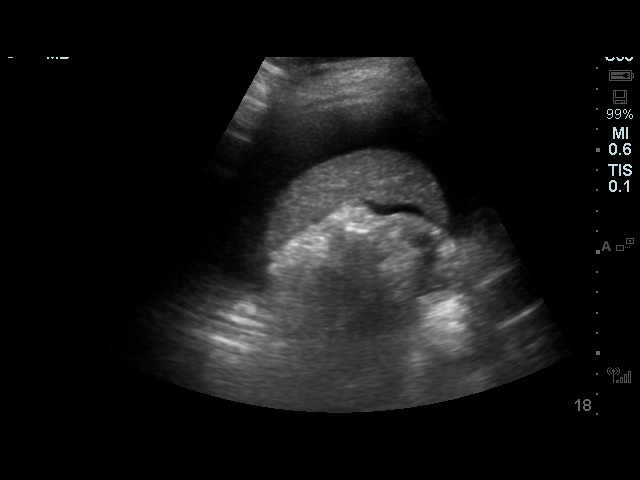
[im 5/7]
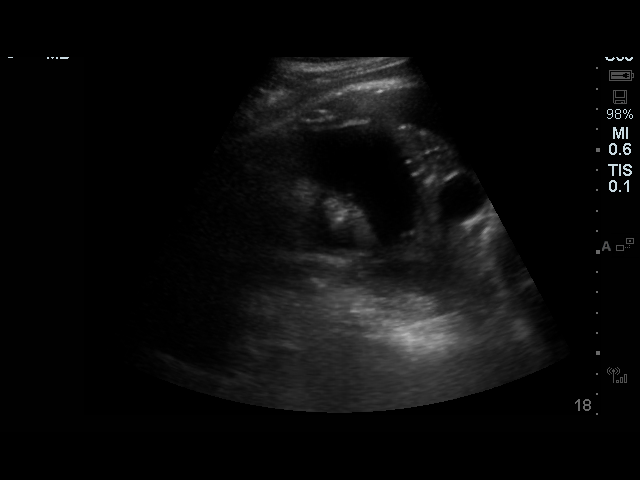
[im 6/7]
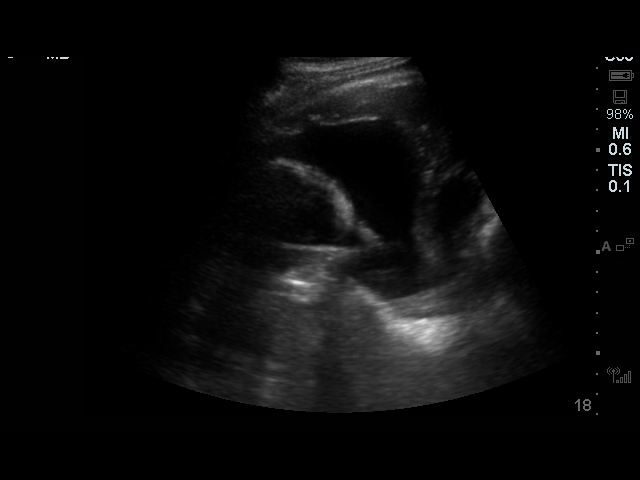
[im 7/7]
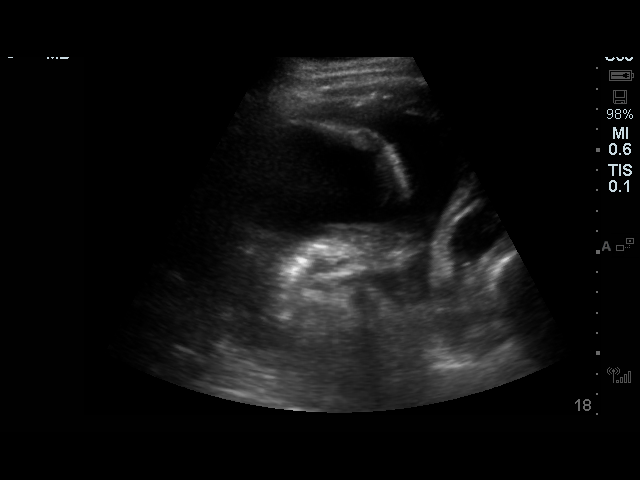

[7 of 7 positions shown; findings below may reference images not displayed]

FINDINGS: A moderate left pleural effusion is present.
IMPRESSION: Moderate left pleural effusion.

## 2017-11-26 IMAGING — CT CT ABD-PELV W/ CM
2 of 5 series · 15 of 46 positions shown, 17 images · IV contrast (APPLIED)
Comparison: CT pelvis 06/12/2016, CT chest 07/29/2016

CLINICAL DATA: Nausea and vomiting, urinary tract infection,
suprapubic pain, abdominal tenderness and guarding, absent bowel
sounds, history hypertension, stroke, chronic DVT, former smoker

EXAM:
CT ABDOMEN AND PELVIS WITH CONTRAST
TECHNIQUE: Multidetector CT imaging of the abdomen and pelvis was performed
using the standard protocol following bolus administration of
intravenous contrast.
CONTRAST:  100mL [id number] IOPAMIDOL (K7AU8R-4PP) INJECTION 61% IV.
Dilute oral contrast.

[Series 3: abdomen 5.0 · axial · 0.78mm/px · z∈[+832,+1257]mm · 12 of 99 slices shown, 14 images]
[im 7/99  soft-tissue]
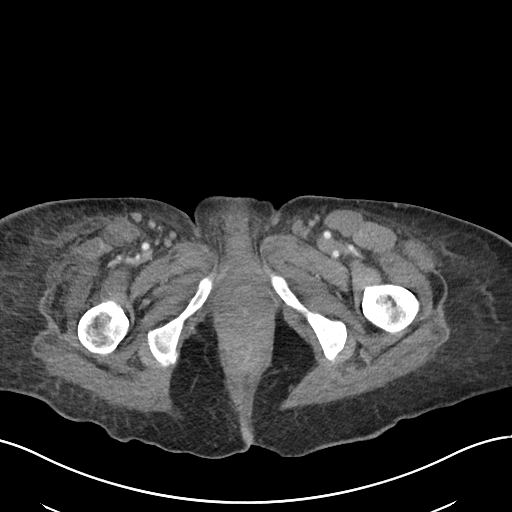
[im 7/99  bone]
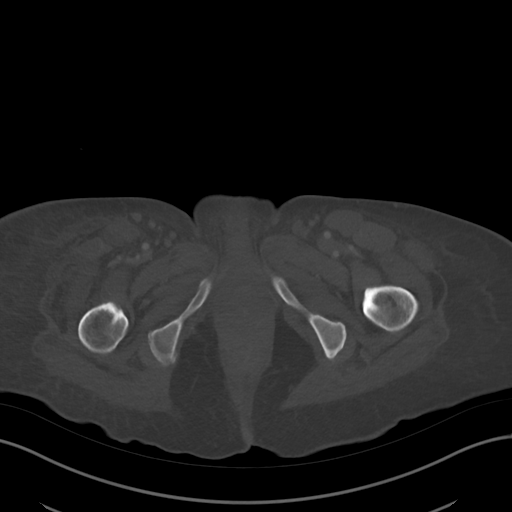
[im 13/99  soft-tissue]
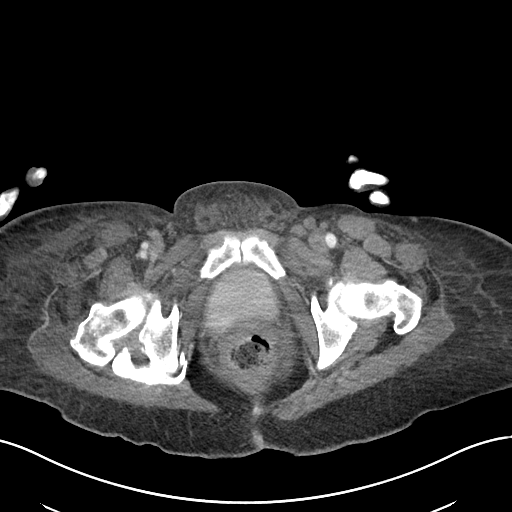
[im 25/99  soft-tissue]
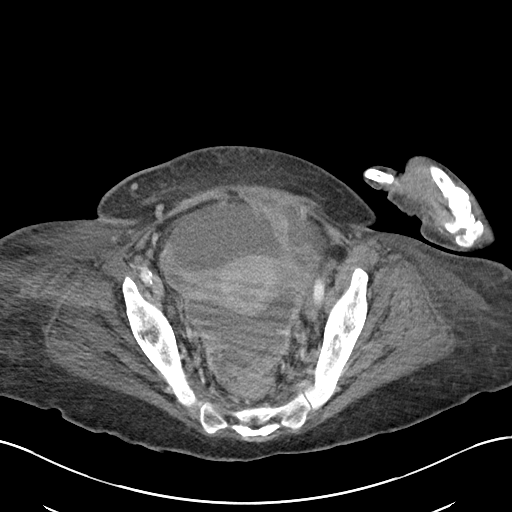
[im 31/99  soft-tissue]
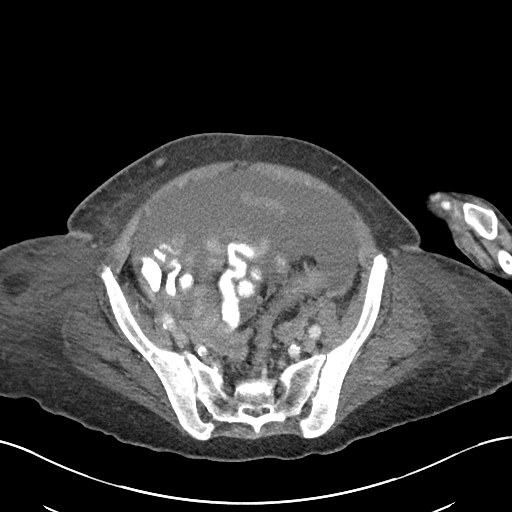
[im 37/99  soft-tissue]
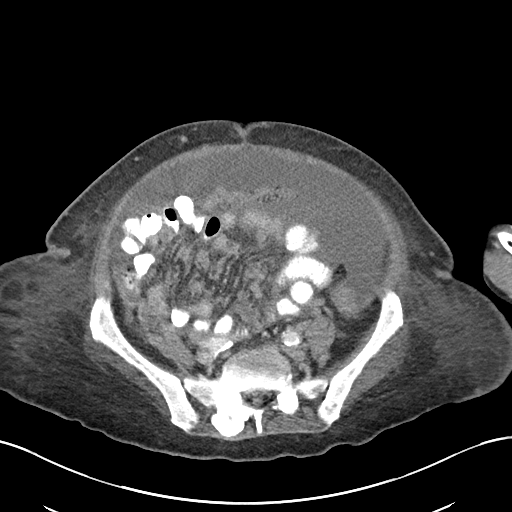
[im 43/99  soft-tissue]
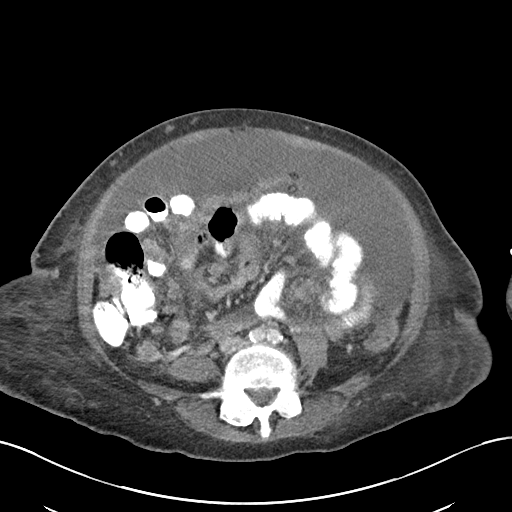
[im 56/99  soft-tissue]
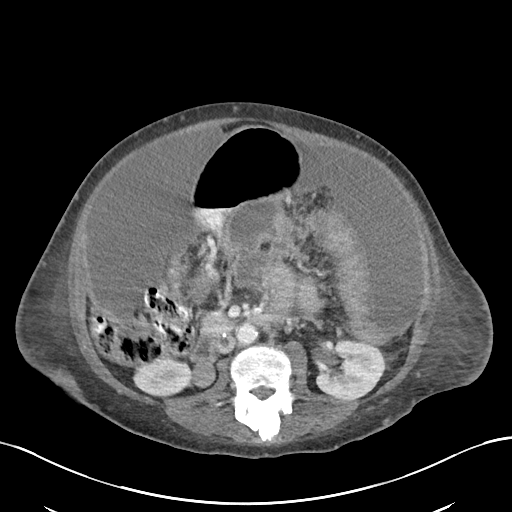
[im 62/99  soft-tissue]
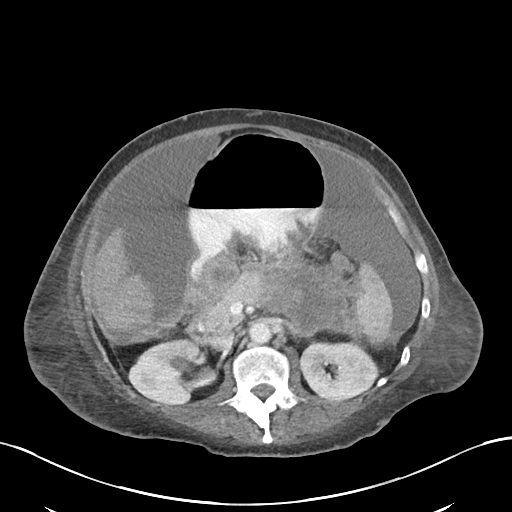
[im 68/99  soft-tissue]
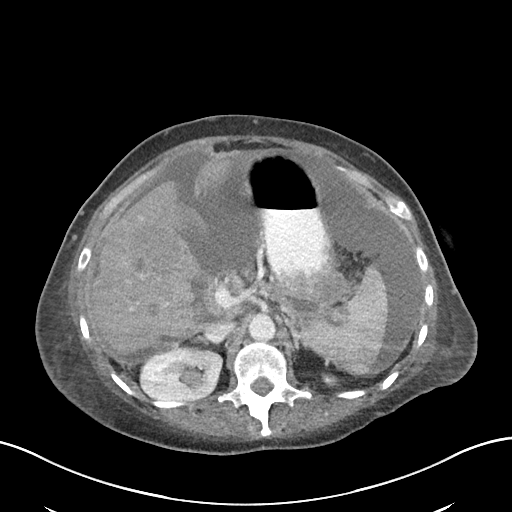
[im 68/99  bone]
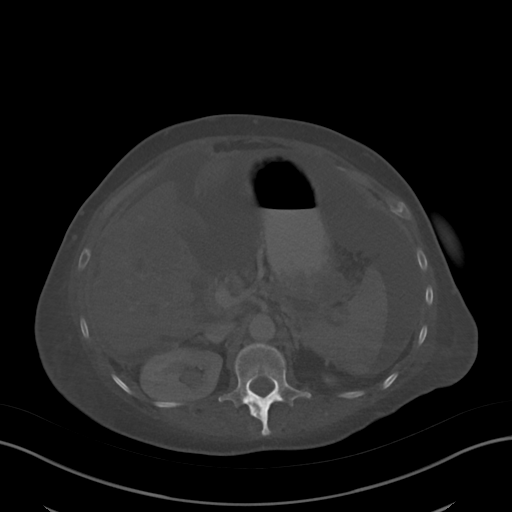
[im 74/99  soft-tissue]
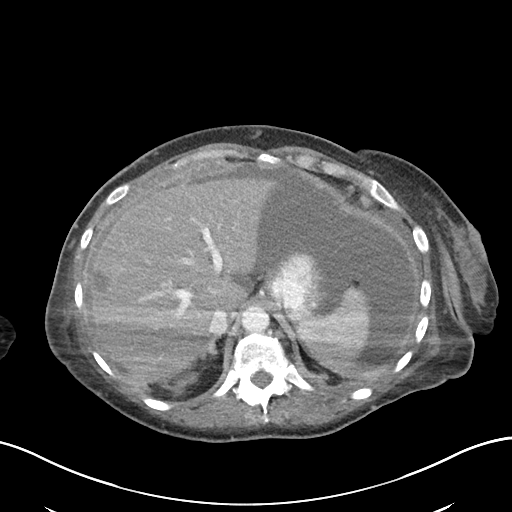
[im 86/99  soft-tissue]
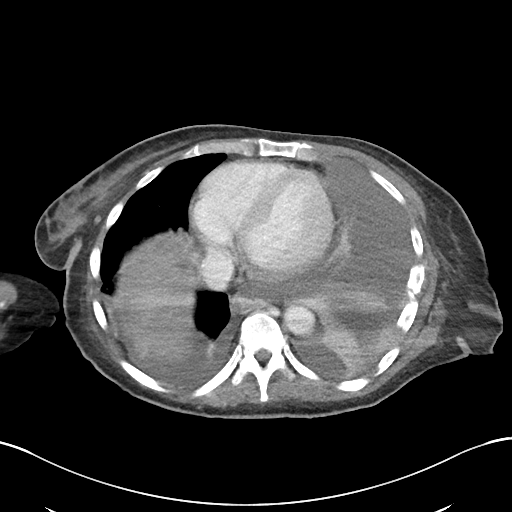
[im 92/99  soft-tissue]
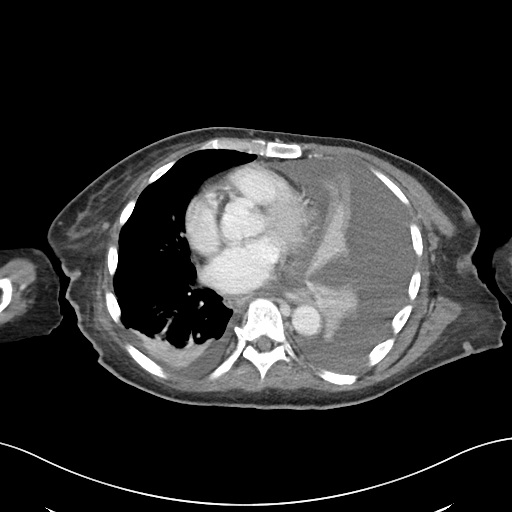

[Series 6: abdomen 3.0 mpr cor · coronal · 0.79mm/px · 3 of 101 slices shown]
[im 34/101  soft-tissue]
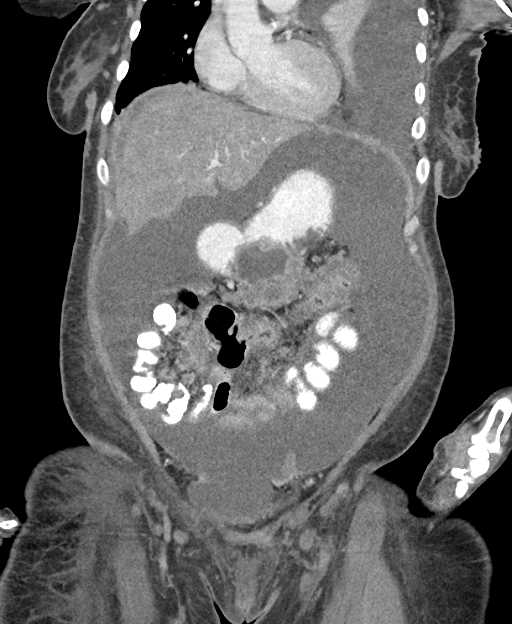
[im 45/101  soft-tissue]
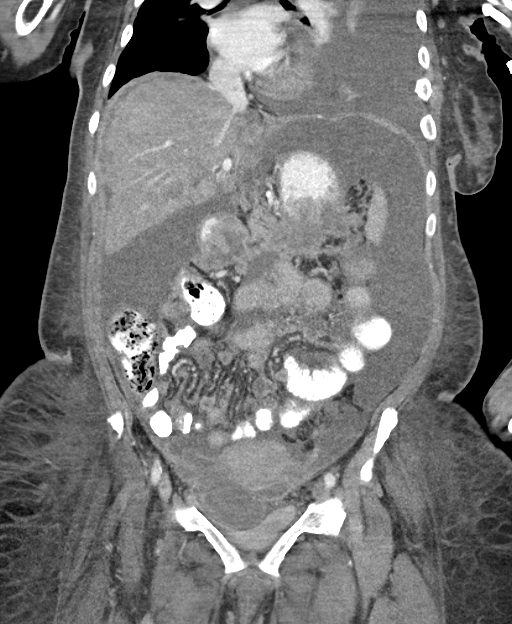
[im 56/101  soft-tissue]
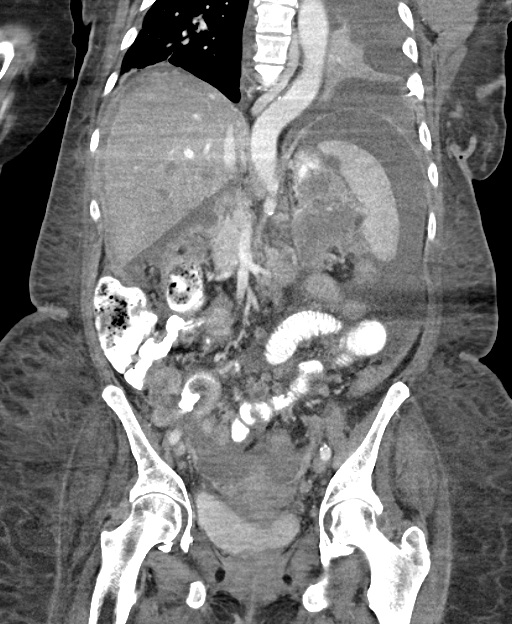

[15 of 46 positions shown; findings below may reference images not displayed]

FINDINGS: Lower chest: Large LEFT pleural effusion with complete atelectasis
of lingula and LEFT lower lobe mediastinal shift LEFT-to-RIGHT.
Small RIGHT pleural effusion with compressive atelectasis of RIGHT
lower lobe.

Hepatobiliary: Poorly defined low-attenuation foci within the liver
suspicious for metastases measuring up to 19 x 17 mm lateral RIGHT
lobe image 26. Gallbladder unremarkable. Background of fatty
infiltration of liver.

Pancreas: Pancreatic head and body normal appearance. Large poorly
defined heterogeneous predominant low-attenuation mass within the
pancreatic tail compatible with a pancreatic neoplasm measuring
x 5.0 x 7.0 cm. Mass is contiguous with the posterior gastric wall,
medial border of spleen, and questionably LEFT adrenal gland.
Conclusion of splenic vein.

Spleen: Central focal area of low-attenuation 12 mm diameter cannot
exclude metastasis

Adrenals/Urinary Tract: Adrenal glands unremarkable. Tiny
nonobstructing LEFT renal calculus. RIGHT hydronephrosis and
hydroureter likely due to ureteral obstruction by tumor in pelvis.

Stomach/Bowel: No evidence of bowel obstruction. Displaced bowel
loops by ascites. Multiple nodules within the small bowel mesenteric
question interloop peritoneal metastases versus adenopathy.

Vascular/Lymphatic: Splenic vein occlusion. IVC filter with question
thrombus in IVC/filter atherosclerotic calcifications aorta and
iliac arteries. Aorta normal caliber. Minimal coronary artery
calcification LEFT main.

Reproductive: Uterus unremarkable. Ovaries not discretely
visualized.

Other: Extensive ascites. Omental caking. Enhancing soft tissue
nodules at anterior peritoneal surface at umbilicus. Tumor
nodularity in lesser sac extending to adjacent to the duodenum.
Tumor nodularity along peritoneal surfaces of the pelvis. Tumor mass
in RIGHT anterior pararenal space 5.3 x 3.0 cm image 52.

Musculoskeletal: No acute osseous findings.
IMPRESSION: Large pancreatic tail mass 7.7 x 5.0 x 7.0 cm compatible with a
primary pancreatic neoplasm.

Local extension of tumor to the posterior wall of the stomach, into
lesser sac, and to medial spleen.

Extensive peritoneal carcinomatosis in abdomen and pelvis.

Hepatic metastases.

Single enlarged retroperitoneal para-aortic node.

IVC filter with suspect thrombus within the filter.

RIGHT ureteral obstruction with hydronephrosis.

Large LEFT pleural effusion and significant LEFT lung atelectasis.

Small RIGHT pleural effusion and mild RIGHT basilar atelectasis.

Aortic Atherosclerosis ([id number]-83G.G).

Findings called to Dr. Gamontle on 07/31/2016 at 4677 hours.

## 2018-02-27 IMAGING — US US TRANSVAGINAL NON-OB
1 series · 13 of 25 positions shown · non-contrast
Comparison: Prior CT from 06/12/2016.

CLINICAL DATA: Initial evaluation for acute lower pelvic pain,
elevated beta HCG, question ovarian mass.

EXAM:
TRANSABDOMINAL AND TRANSVAGINAL ULTRASOUND OF PELVIS
TECHNIQUE: Both transabdominal and transvaginal ultrasound examinations of the
pelvis were performed. Transabdominal technique was performed for
global imaging of the pelvis including uterus, ovaries, adnexal
regions, and pelvic cul-de-sac. It was necessary to proceed with
endovaginal exam following the transabdominal exam to visualize the
uterus and ovaries.

[Series 1: us transvaginal non-ob · 0.22mm/px · 13 of 74 slices shown]
[im 1/74]
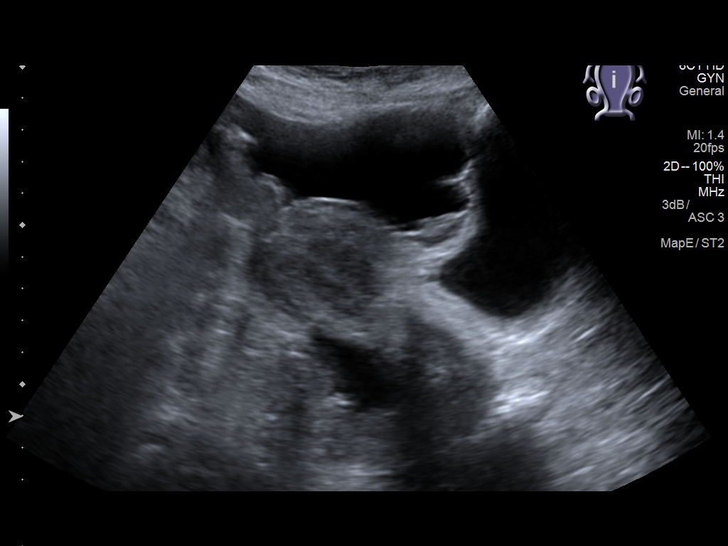
[im 7/74]
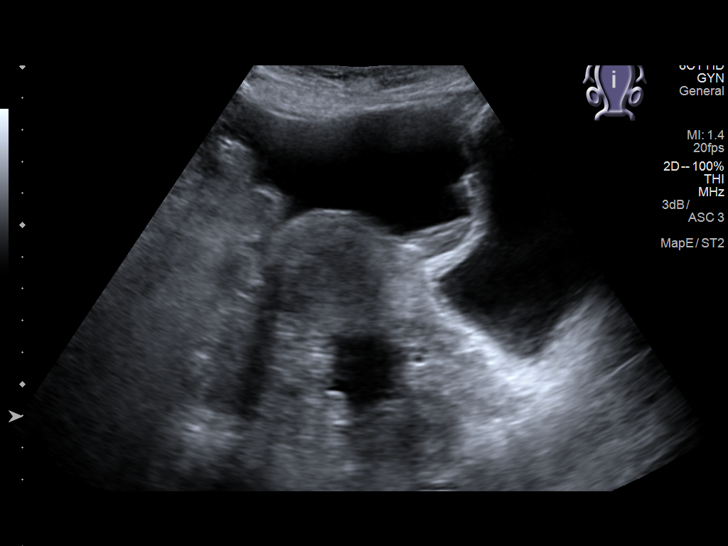
[im 13/74]
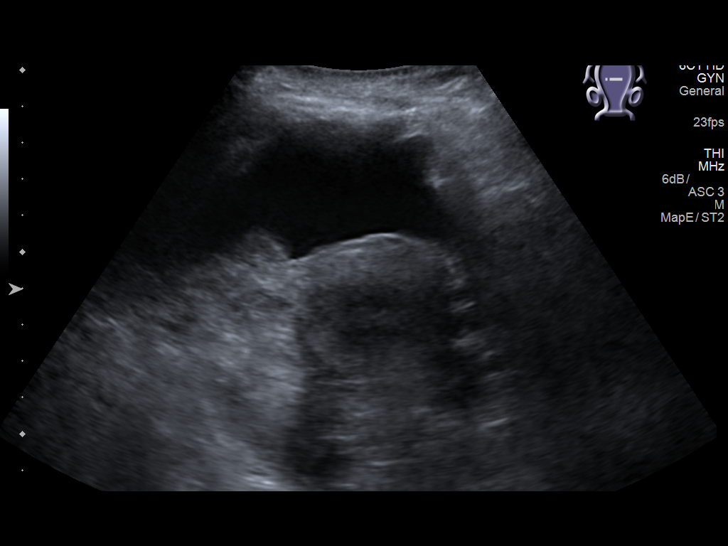
[im 19/74]
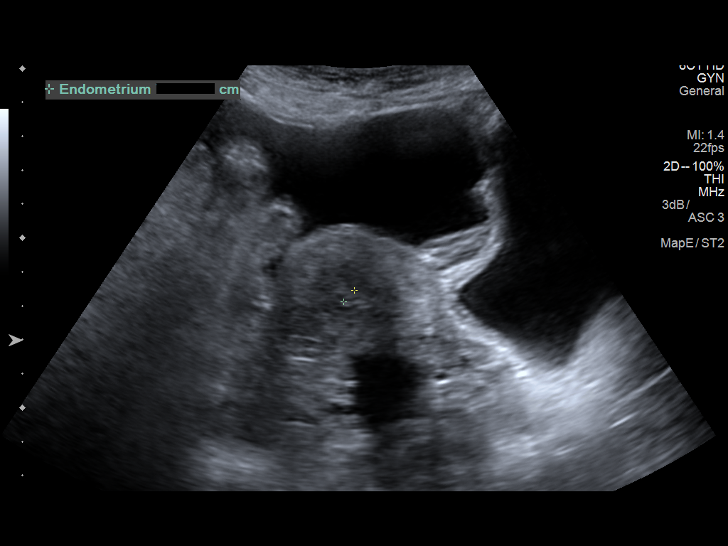
[im 25/74]
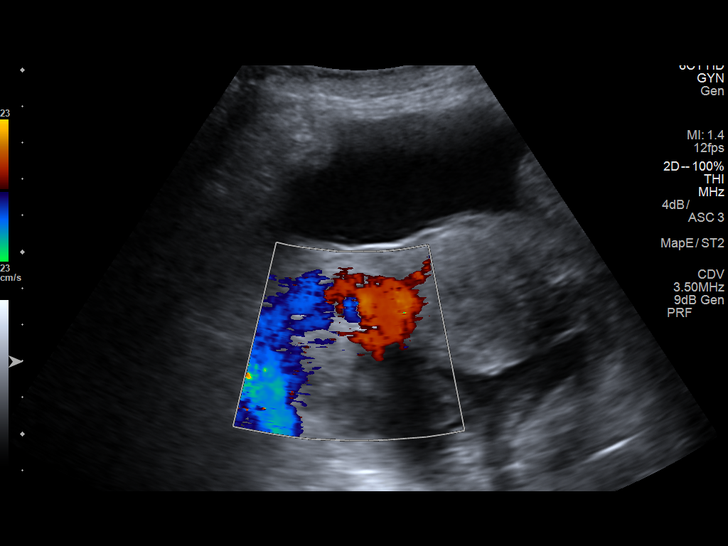
[im 31/74]
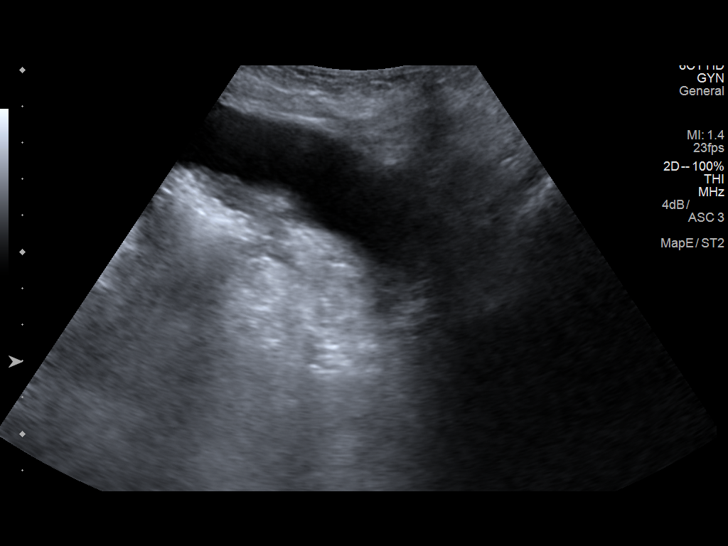
[im 37/74]
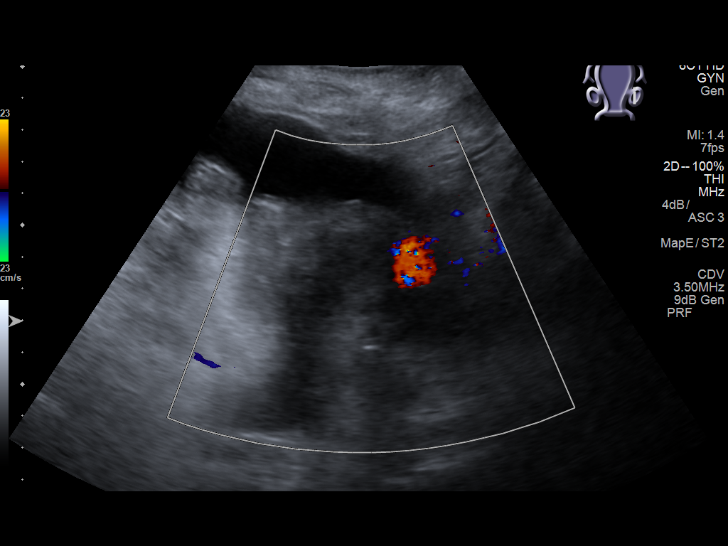
[im 43/74]
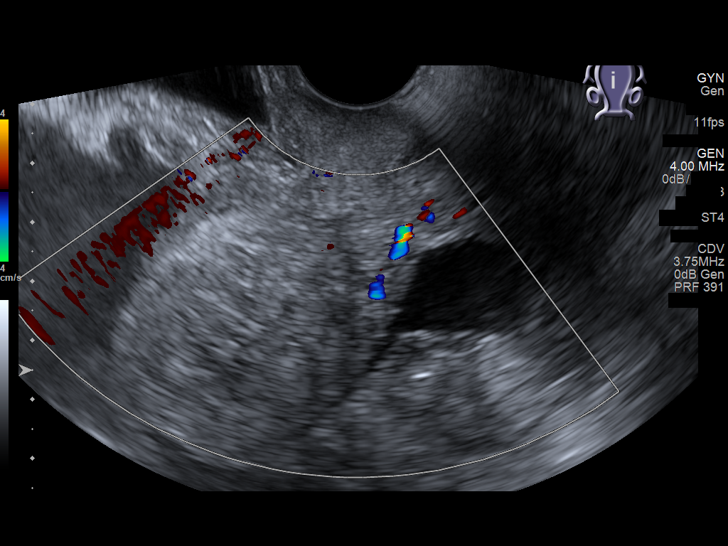
[im 49/74]
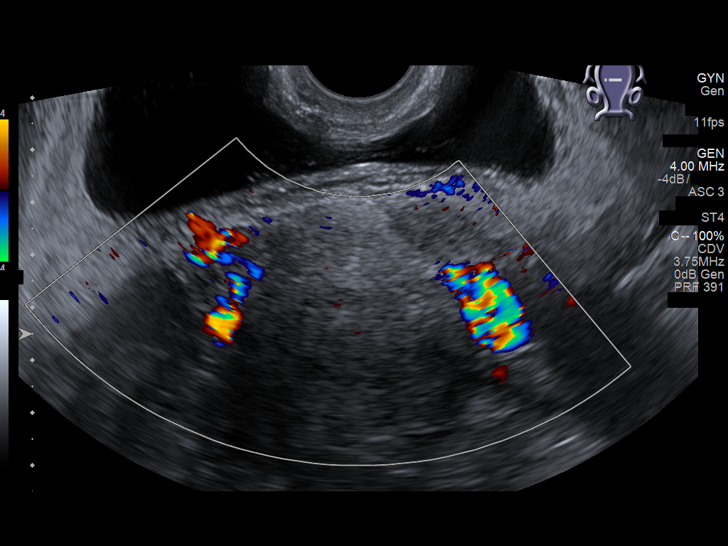
[im 55/74]
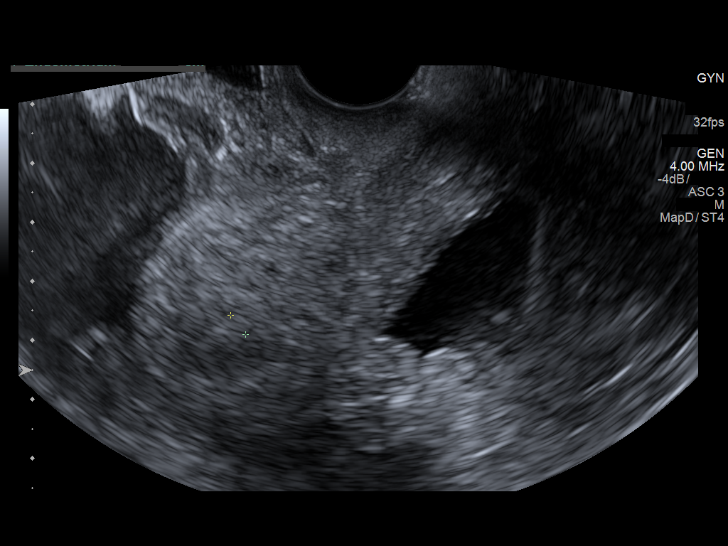
[im 61/74]
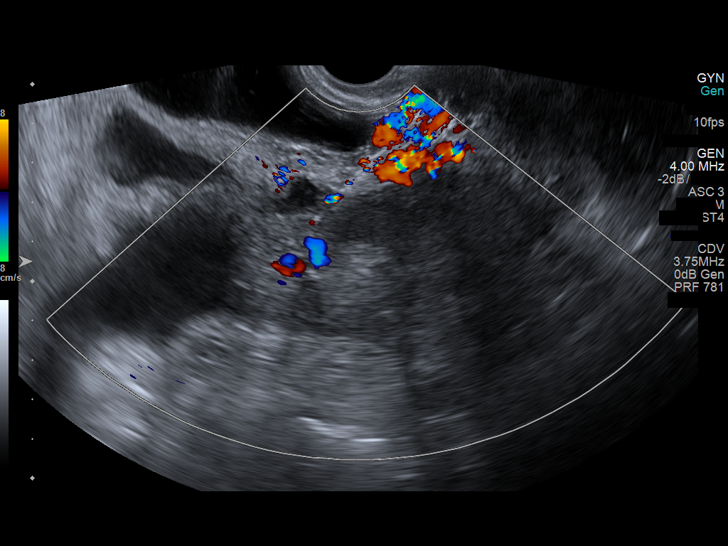
[im 67/74]
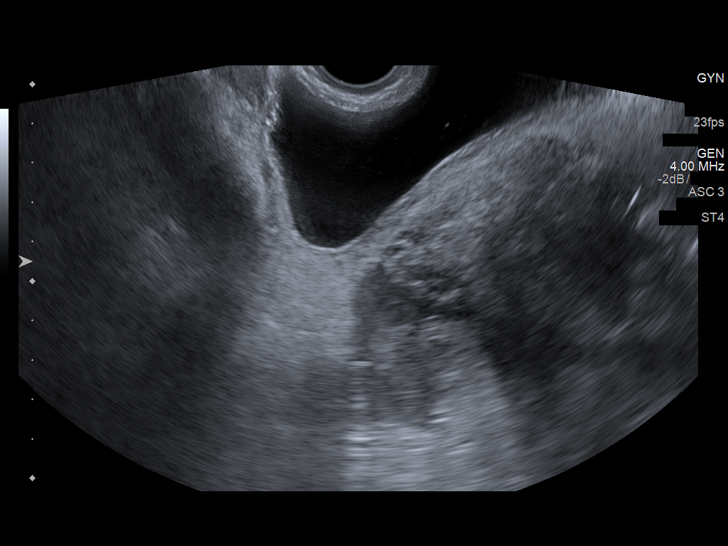
[im 74/74]
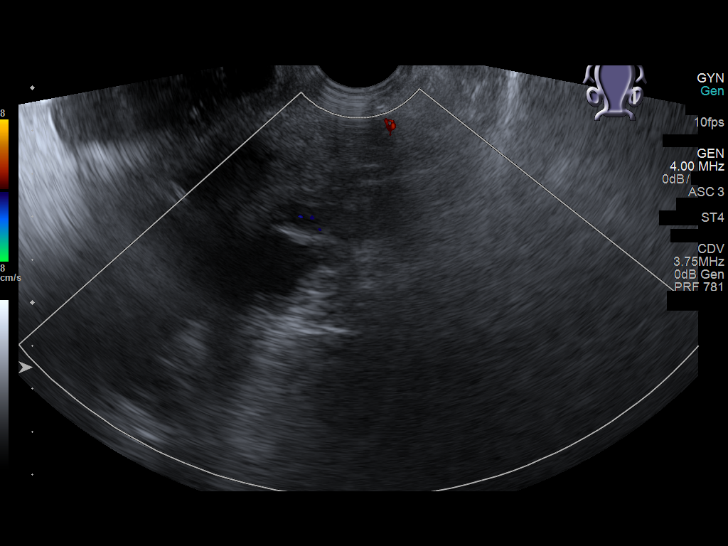

[13 of 25 positions shown; findings below may reference images not displayed]

FINDINGS: Uterus

Measurements: 9.2 x 3.9 x 4.3 cm. Uterus heterogeneous without focal
mass. Uterus is anteverted.

Endometrium

Thickness: 5 mm.  No focal abnormality visualized.

Right ovary

Measurements: 2.6 x 2.0 x 2.2 cm. Normal appearance/no adnexal mass.

Left ovary

Not visualized.  No adnexal mass.

Other findings

Large volume free fluid within the pelvis.
IMPRESSION: 1. Normal sonographic appearance of the right ovary, with
nonvisualization of the left ovary. No adnexal mass evident by
sonography.
2. Large volume free fluid within the pelvis.
3. Normal sonographic appearance of the uterus.

## 2018-02-27 IMAGING — US US RENAL
1 series · 14 of 25 positions shown · non-contrast
Comparison: 06/12/2016 renal ultrasound

CLINICAL DATA: Acute kidney injury.  Pyelonephritis

EXAM:
RENAL / URINARY TRACT ULTRASOUND COMPLETE

[Series 1: us renal · 0.28mm/px · 14 of 33 slices shown]
[im 1/33]
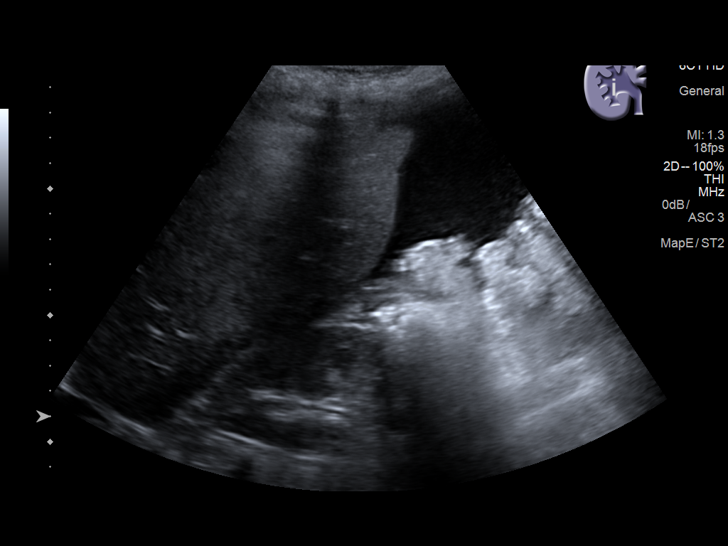
[im 3/33]
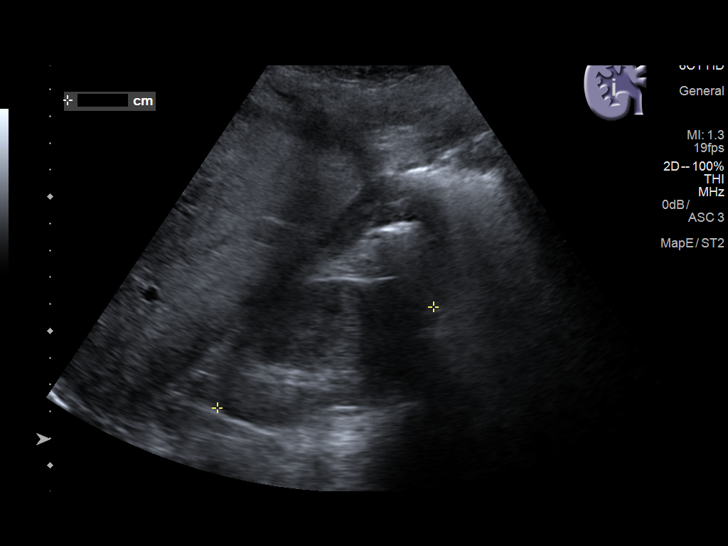
[im 6/33]
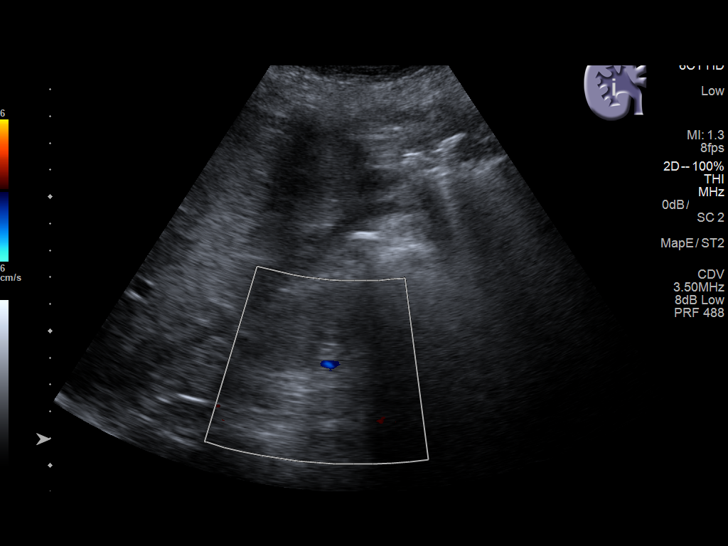
[im 9/33]
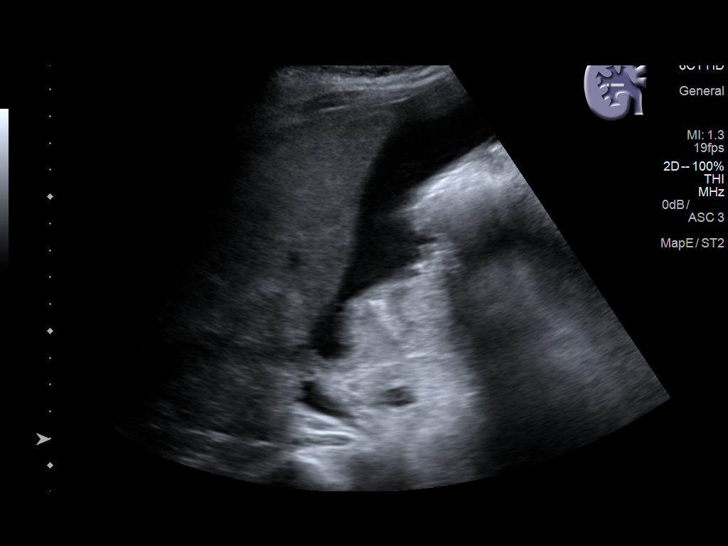
[im 11/33]
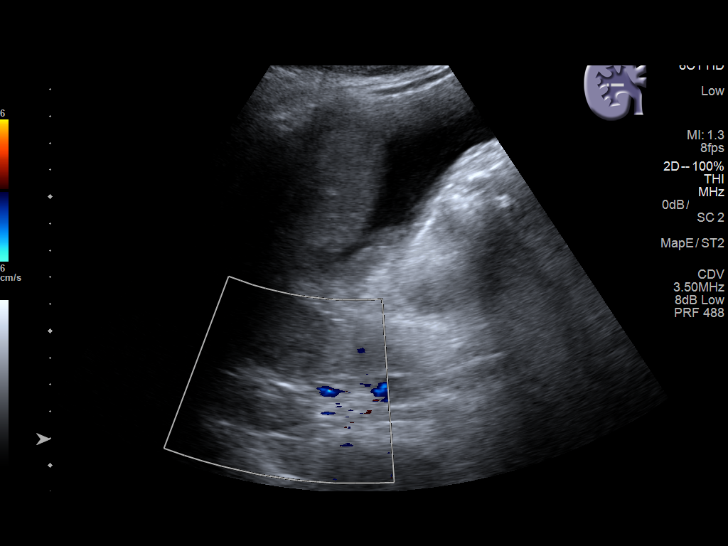
[im 13/33]
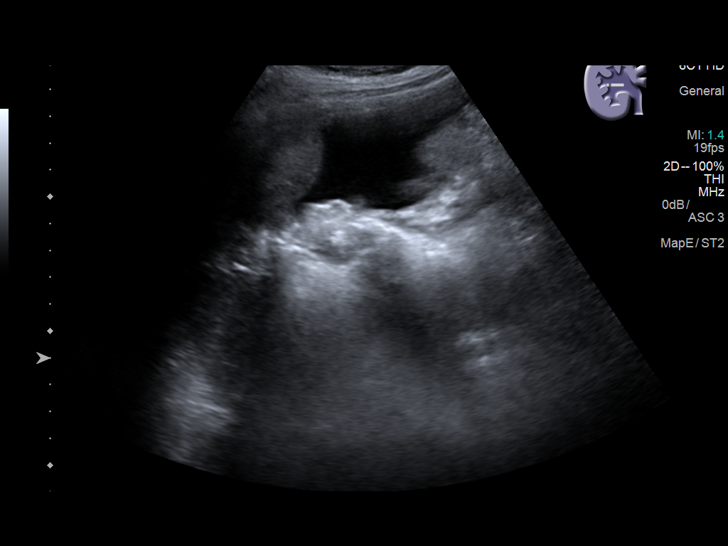
[im 15/33]
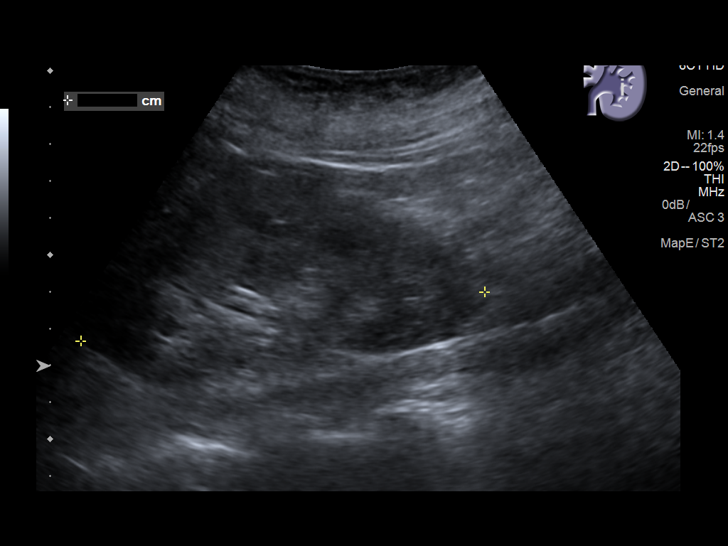
[im 18/33]
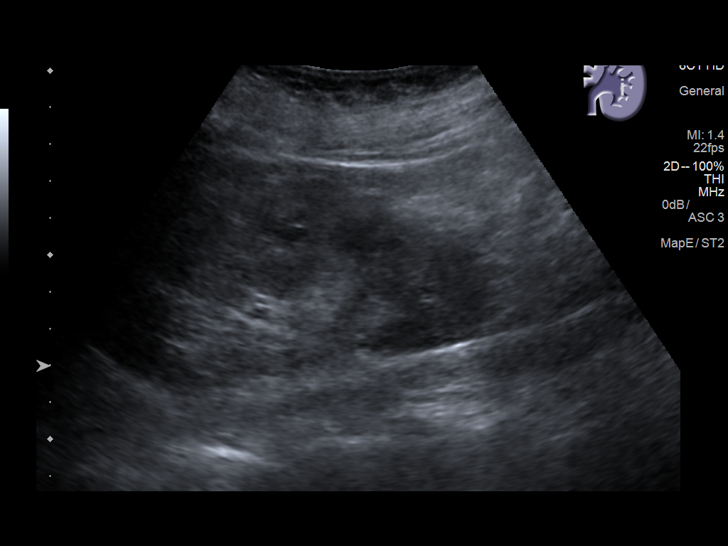
[im 21/33]
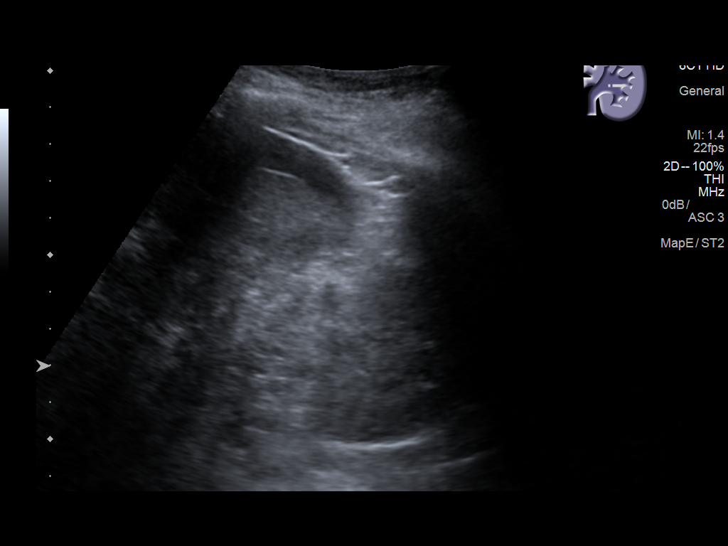
[im 22/33]
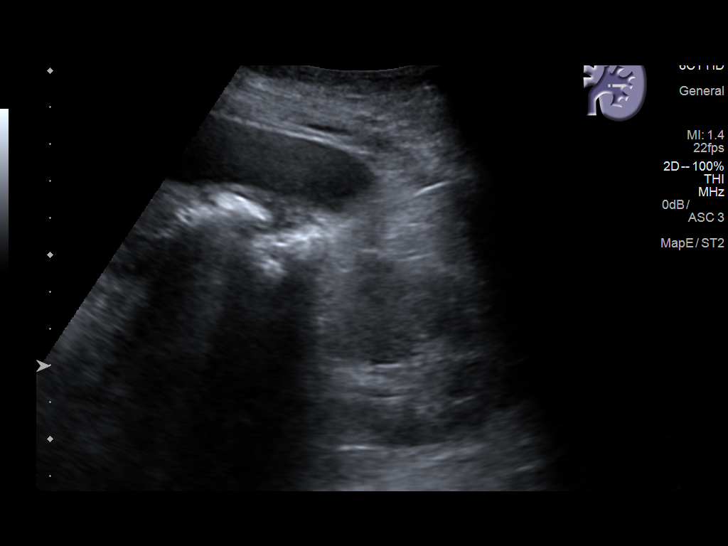
[im 25/33]
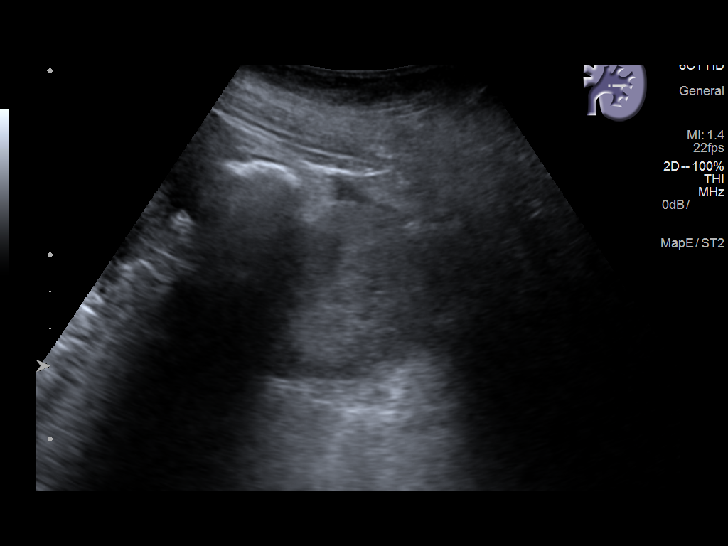
[im 27/33]
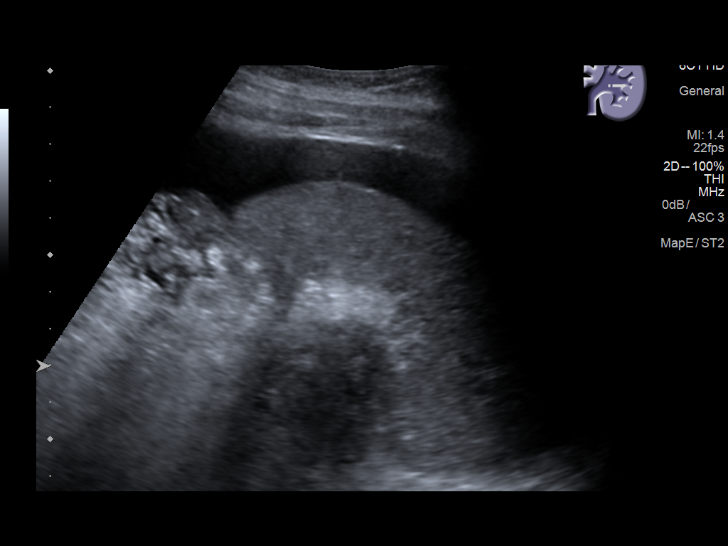
[im 30/33]
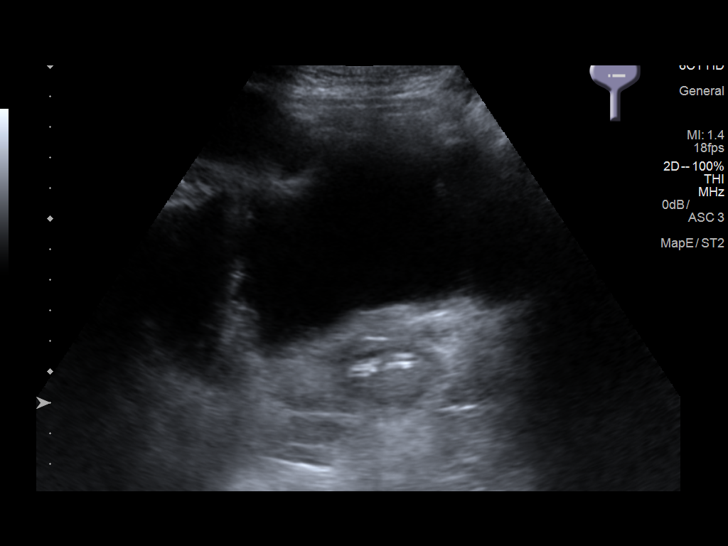
[im 33/33]
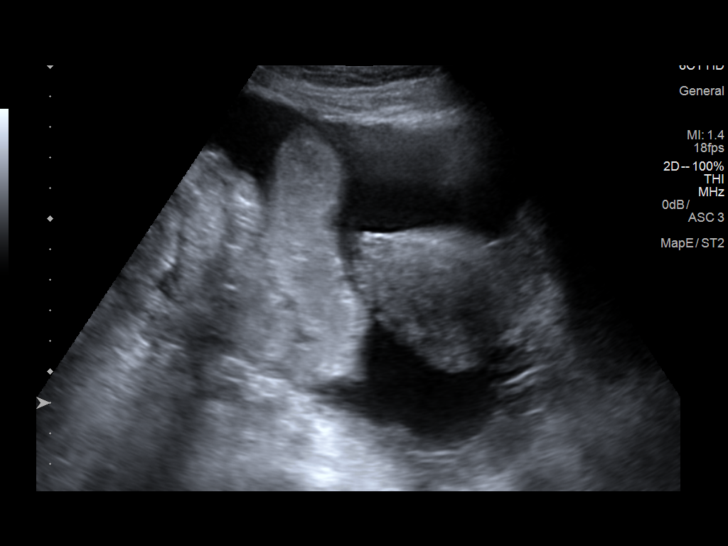

[14 of 25 positions shown; findings below may reference images not displayed]

FINDINGS: Right Kidney:

Length: 9 cm (likely underestimated due to partial visualization of
the lower pole). Echogenic, consistent with medical renal disease.
No hydronephrosis.

Left Kidney:

Length: 11 cm. Echogenic, consistent with medical renal disease. No
hydronephrosis.

Bladder:

Appears normal for degree of bladder distention.

Small ascites.  Pleural effusion on the right at least.
IMPRESSION: 1. No hydronephrosis.
2. Medical renal disease.
3. Small ascites that is new from 06/12/2016 sonogram. Right pleural
effusion.
